# Patient Record
Sex: Female | Born: 1950 | Race: White | Hispanic: No | Smoking: Never smoker
Health system: Southern US, Community
[De-identification: ages and names within clinical notes are randomized; demographics above are authoritative.]

## PROBLEM LIST (undated history)

## (undated) DIAGNOSIS — R600 Localized edema: Secondary | ICD-10-CM

## (undated) DIAGNOSIS — Z87442 Personal history of urinary calculi: Secondary | ICD-10-CM

## (undated) DIAGNOSIS — E559 Vitamin D deficiency, unspecified: Secondary | ICD-10-CM

## (undated) DIAGNOSIS — I10 Essential (primary) hypertension: Secondary | ICD-10-CM

## (undated) DIAGNOSIS — N200 Calculus of kidney: Secondary | ICD-10-CM

## (undated) DIAGNOSIS — Z87448 Personal history of other diseases of urinary system: Secondary | ICD-10-CM

## (undated) DIAGNOSIS — J189 Pneumonia, unspecified organism: Secondary | ICD-10-CM

## (undated) DIAGNOSIS — I714 Abdominal aortic aneurysm, without rupture: Secondary | ICD-10-CM

## (undated) DIAGNOSIS — R0602 Shortness of breath: Secondary | ICD-10-CM

## (undated) DIAGNOSIS — G4733 Obstructive sleep apnea (adult) (pediatric): Secondary | ICD-10-CM

## (undated) DIAGNOSIS — K219 Gastro-esophageal reflux disease without esophagitis: Secondary | ICD-10-CM

## (undated) DIAGNOSIS — E785 Hyperlipidemia, unspecified: Secondary | ICD-10-CM

## (undated) DIAGNOSIS — I2510 Atherosclerotic heart disease of native coronary artery without angina pectoris: Secondary | ICD-10-CM

## (undated) DIAGNOSIS — M2550 Pain in unspecified joint: Secondary | ICD-10-CM

## (undated) DIAGNOSIS — M1990 Unspecified osteoarthritis, unspecified site: Secondary | ICD-10-CM

## (undated) DIAGNOSIS — J4540 Moderate persistent asthma, uncomplicated: Secondary | ICD-10-CM

## (undated) HISTORY — DX: Personal history of other diseases of urinary system: Z87.448

## (undated) HISTORY — PX: COLONOSCOPY: SHX174

## (undated) HISTORY — DX: Moderate persistent asthma, uncomplicated: J45.40

## (undated) HISTORY — DX: Hyperlipidemia, unspecified: E78.5

## (undated) HISTORY — DX: Pain in unspecified joint: M25.50

## (undated) HISTORY — DX: Gastro-esophageal reflux disease without esophagitis: K21.9

## (undated) HISTORY — DX: Shortness of breath: R06.02

## (undated) HISTORY — PX: OTHER SURGICAL HISTORY: SHX169

## (undated) HISTORY — DX: Abdominal aortic aneurysm, without rupture: I71.4

## (undated) HISTORY — DX: Atherosclerotic heart disease of native coronary artery without angina pectoris: I25.10

## (undated) HISTORY — DX: Localized edema: R60.0

## (undated) HISTORY — DX: Obstructive sleep apnea (adult) (pediatric): G47.33

## (undated) HISTORY — PX: RECTAL SURGERY: SHX760

## (undated) HISTORY — PX: CORONARY ANGIOPLASTY: SHX604

## (undated) HISTORY — PX: TOOTH EXTRACTION: SUR596

## (undated) HISTORY — DX: Vitamin D deficiency, unspecified: E55.9

---

## 1982-11-01 HISTORY — PX: TUBAL LIGATION: SHX77

## 1999-09-25 ENCOUNTER — Other Ambulatory Visit: Admission: RE | Admit: 1999-09-25 | Discharge: 1999-09-25 | Payer: Self-pay | Admitting: Family Medicine

## 1999-10-30 ENCOUNTER — Encounter: Admission: RE | Admit: 1999-10-30 | Discharge: 1999-10-30 | Payer: Self-pay | Admitting: Family Medicine

## 1999-10-30 ENCOUNTER — Encounter: Payer: Self-pay | Admitting: Family Medicine

## 2000-01-07 ENCOUNTER — Ambulatory Visit (HOSPITAL_COMMUNITY): Admission: RE | Admit: 2000-01-07 | Discharge: 2000-01-07 | Payer: Self-pay | Admitting: Family Medicine

## 2000-01-30 ENCOUNTER — Encounter: Admission: RE | Admit: 2000-01-30 | Discharge: 2000-01-30 | Payer: Self-pay | Admitting: Family Medicine

## 2000-01-30 ENCOUNTER — Encounter: Payer: Self-pay | Admitting: Family Medicine

## 2000-05-31 ENCOUNTER — Ambulatory Visit (HOSPITAL_COMMUNITY): Admission: RE | Admit: 2000-05-31 | Discharge: 2000-05-31 | Payer: Self-pay | Admitting: *Deleted

## 2000-10-07 ENCOUNTER — Ambulatory Visit (HOSPITAL_COMMUNITY): Admission: RE | Admit: 2000-10-07 | Discharge: 2000-10-07 | Payer: Self-pay | Admitting: Internal Medicine

## 2000-10-07 ENCOUNTER — Encounter: Payer: Self-pay | Admitting: Internal Medicine

## 2000-10-31 ENCOUNTER — Encounter: Payer: Self-pay | Admitting: Family Medicine

## 2000-10-31 ENCOUNTER — Encounter: Admission: RE | Admit: 2000-10-31 | Discharge: 2000-10-31 | Payer: Self-pay | Admitting: Family Medicine

## 2000-12-12 ENCOUNTER — Other Ambulatory Visit: Admission: RE | Admit: 2000-12-12 | Discharge: 2000-12-12 | Payer: Self-pay | Admitting: Family Medicine

## 2002-03-02 ENCOUNTER — Encounter: Payer: Self-pay | Admitting: Internal Medicine

## 2002-03-02 ENCOUNTER — Encounter: Admission: RE | Admit: 2002-03-02 | Discharge: 2002-03-02 | Payer: Self-pay | Admitting: Internal Medicine

## 2002-05-25 ENCOUNTER — Ambulatory Visit (HOSPITAL_BASED_OUTPATIENT_CLINIC_OR_DEPARTMENT_OTHER): Admission: RE | Admit: 2002-05-25 | Discharge: 2002-05-25 | Payer: Self-pay | Admitting: Otolaryngology

## 2003-05-13 ENCOUNTER — Encounter: Payer: Self-pay | Admitting: Internal Medicine

## 2003-05-13 ENCOUNTER — Ambulatory Visit (HOSPITAL_COMMUNITY): Admission: RE | Admit: 2003-05-13 | Discharge: 2003-05-13 | Payer: Self-pay | Admitting: Internal Medicine

## 2003-05-13 ENCOUNTER — Encounter: Admission: RE | Admit: 2003-05-13 | Discharge: 2003-05-13 | Payer: Self-pay | Admitting: Internal Medicine

## 2004-08-07 ENCOUNTER — Encounter: Admission: RE | Admit: 2004-08-07 | Discharge: 2004-08-07 | Payer: Self-pay | Admitting: Internal Medicine

## 2005-10-27 ENCOUNTER — Encounter: Admission: RE | Admit: 2005-10-27 | Discharge: 2005-10-27 | Payer: Self-pay | Admitting: Internal Medicine

## 2006-05-05 ENCOUNTER — Encounter: Admission: RE | Admit: 2006-05-05 | Discharge: 2006-05-05 | Payer: Self-pay | Admitting: Internal Medicine

## 2006-10-28 ENCOUNTER — Encounter: Admission: RE | Admit: 2006-10-28 | Discharge: 2006-10-28 | Payer: Self-pay | Admitting: Internal Medicine

## 2008-05-06 ENCOUNTER — Encounter: Admission: RE | Admit: 2008-05-06 | Discharge: 2008-05-06 | Payer: Self-pay | Admitting: Internal Medicine

## 2009-09-05 ENCOUNTER — Encounter: Admission: RE | Admit: 2009-09-05 | Discharge: 2009-09-05 | Payer: Self-pay | Admitting: Internal Medicine

## 2010-10-27 ENCOUNTER — Encounter
Admission: RE | Admit: 2010-10-27 | Discharge: 2010-10-27 | Payer: Self-pay | Source: Home / Self Care | Attending: Internal Medicine | Admitting: Internal Medicine

## 2013-03-19 ENCOUNTER — Other Ambulatory Visit: Payer: Self-pay

## 2013-03-19 DIAGNOSIS — Z1231 Encounter for screening mammogram for malignant neoplasm of breast: Secondary | ICD-10-CM

## 2013-03-29 ENCOUNTER — Ambulatory Visit
Admission: RE | Admit: 2013-03-29 | Discharge: 2013-03-29 | Disposition: A | Discharge status: Home / Self Care | Payer: BC Managed Care – PPO | Source: Ambulatory Visit

## 2013-03-29 DIAGNOSIS — Z1231 Encounter for screening mammogram for malignant neoplasm of breast: Secondary | ICD-10-CM

## 2014-05-08 ENCOUNTER — Other Ambulatory Visit: Payer: Self-pay

## 2014-05-08 DIAGNOSIS — Z1231 Encounter for screening mammogram for malignant neoplasm of breast: Secondary | ICD-10-CM

## 2014-05-10 ENCOUNTER — Ambulatory Visit
Admission: RE | Admit: 2014-05-10 | Discharge: 2014-05-10 | Disposition: A | Discharge status: Home / Self Care | Payer: BC Managed Care – PPO | Source: Ambulatory Visit

## 2014-05-10 ENCOUNTER — Encounter (INDEPENDENT_AMBULATORY_CARE_PROVIDER_SITE_OTHER): Payer: Self-pay

## 2014-05-10 DIAGNOSIS — Z1231 Encounter for screening mammogram for malignant neoplasm of breast: Secondary | ICD-10-CM

## 2014-06-06 ENCOUNTER — Encounter: Payer: Self-pay | Admitting: Internal Medicine

## 2015-11-24 ENCOUNTER — Other Ambulatory Visit: Payer: Self-pay

## 2015-11-24 DIAGNOSIS — Z1231 Encounter for screening mammogram for malignant neoplasm of breast: Secondary | ICD-10-CM

## 2015-12-12 ENCOUNTER — Ambulatory Visit: Payer: Self-pay

## 2016-01-07 DIAGNOSIS — I10 Essential (primary) hypertension: Secondary | ICD-10-CM | POA: Insufficient documentation

## 2016-01-07 DIAGNOSIS — R5383 Other fatigue: Secondary | ICD-10-CM

## 2016-01-07 DIAGNOSIS — M150 Primary generalized (osteo)arthritis: Secondary | ICD-10-CM

## 2016-01-07 DIAGNOSIS — M159 Polyosteoarthritis, unspecified: Secondary | ICD-10-CM

## 2016-01-07 DIAGNOSIS — R5381 Other malaise: Secondary | ICD-10-CM

## 2016-01-07 DIAGNOSIS — R7309 Other abnormal glucose: Secondary | ICD-10-CM

## 2016-01-07 DIAGNOSIS — K219 Gastro-esophageal reflux disease without esophagitis: Secondary | ICD-10-CM

## 2016-01-07 DIAGNOSIS — Z6841 Body Mass Index (BMI) 40.0 and over, adult: Secondary | ICD-10-CM

## 2016-01-07 HISTORY — DX: Other malaise: R53.81

## 2016-01-07 HISTORY — DX: Polyosteoarthritis, unspecified: M15.9

## 2016-01-07 HISTORY — DX: Other malaise: R53.83

## 2016-01-07 HISTORY — DX: Morbid (severe) obesity due to excess calories: E66.01

## 2016-01-07 HISTORY — DX: Essential (primary) hypertension: I10

## 2016-01-07 HISTORY — DX: Primary generalized (osteo)arthritis: M15.0

## 2016-01-07 HISTORY — DX: Gastro-esophageal reflux disease without esophagitis: K21.9

## 2016-01-07 HISTORY — DX: Other abnormal glucose: R73.09

## 2016-02-16 DIAGNOSIS — R072 Precordial pain: Secondary | ICD-10-CM | POA: Insufficient documentation

## 2016-02-16 DIAGNOSIS — R0609 Other forms of dyspnea: Secondary | ICD-10-CM | POA: Insufficient documentation

## 2016-02-16 DIAGNOSIS — E785 Hyperlipidemia, unspecified: Secondary | ICD-10-CM | POA: Insufficient documentation

## 2016-02-16 HISTORY — DX: Other forms of dyspnea: R06.09

## 2016-02-16 HISTORY — DX: Precordial pain: R07.2

## 2016-02-16 HISTORY — DX: Hyperlipidemia, unspecified: E78.5

## 2016-06-01 DIAGNOSIS — M79604 Pain in right leg: Secondary | ICD-10-CM

## 2016-06-01 DIAGNOSIS — M25551 Pain in right hip: Secondary | ICD-10-CM

## 2016-06-01 DIAGNOSIS — M545 Low back pain, unspecified: Secondary | ICD-10-CM | POA: Insufficient documentation

## 2016-06-01 HISTORY — DX: Low back pain, unspecified: M54.50

## 2016-06-01 HISTORY — DX: Pain in right leg: M79.604

## 2016-06-01 HISTORY — DX: Pain in left hip: M25.551

## 2016-08-10 DIAGNOSIS — J4540 Moderate persistent asthma, uncomplicated: Secondary | ICD-10-CM

## 2016-08-10 DIAGNOSIS — J984 Other disorders of lung: Secondary | ICD-10-CM

## 2016-08-10 HISTORY — DX: Moderate persistent asthma, uncomplicated: J45.40

## 2016-08-10 HISTORY — DX: Other disorders of lung: J98.4

## 2016-09-30 DIAGNOSIS — G2581 Restless legs syndrome: Secondary | ICD-10-CM

## 2016-09-30 DIAGNOSIS — G4733 Obstructive sleep apnea (adult) (pediatric): Secondary | ICD-10-CM

## 2016-09-30 HISTORY — DX: Obstructive sleep apnea (adult) (pediatric): G47.33

## 2016-09-30 HISTORY — DX: Restless legs syndrome: G25.81

## 2017-01-27 DIAGNOSIS — E785 Hyperlipidemia, unspecified: Secondary | ICD-10-CM | POA: Insufficient documentation

## 2017-01-27 HISTORY — DX: Hyperlipidemia, unspecified: E78.5

## 2017-02-09 DIAGNOSIS — I712 Thoracic aortic aneurysm, without rupture, unspecified: Secondary | ICD-10-CM

## 2017-02-09 DIAGNOSIS — I2510 Atherosclerotic heart disease of native coronary artery without angina pectoris: Secondary | ICD-10-CM

## 2017-02-09 HISTORY — DX: Thoracic aortic aneurysm, without rupture, unspecified: I71.20

## 2017-02-09 HISTORY — DX: Atherosclerotic heart disease of native coronary artery without angina pectoris: I25.10

## 2017-04-21 DIAGNOSIS — Z8639 Personal history of other endocrine, nutritional and metabolic disease: Secondary | ICD-10-CM

## 2017-04-21 HISTORY — DX: Personal history of other endocrine, nutritional and metabolic disease: Z86.39

## 2017-05-09 ENCOUNTER — Other Ambulatory Visit: Payer: Self-pay | Admitting: Internal Medicine

## 2017-05-09 DIAGNOSIS — Z1231 Encounter for screening mammogram for malignant neoplasm of breast: Secondary | ICD-10-CM

## 2017-05-31 ENCOUNTER — Ambulatory Visit
Admission: RE | Admit: 2017-05-31 | Discharge: 2017-05-31 | Disposition: A | Discharge status: Home / Self Care | Payer: BLUE CROSS/BLUE SHIELD | Source: Ambulatory Visit | Attending: Internal Medicine | Admitting: Internal Medicine

## 2017-05-31 DIAGNOSIS — Z1231 Encounter for screening mammogram for malignant neoplasm of breast: Secondary | ICD-10-CM

## 2017-05-31 IMAGING — MG 2D DIGITAL SCREENING BILATERAL MAMMOGRAM WITH CAD AND ADJUNCT TO
3 series · 3 of 3 positions shown · non-contrast
Comparison: Previous exam(s).

CLINICAL DATA: Screening.

EXAM:
2D DIGITAL SCREENING BILATERAL MAMMOGRAM WITH CAD AND ADJUNCT TOMO

[L MLO]
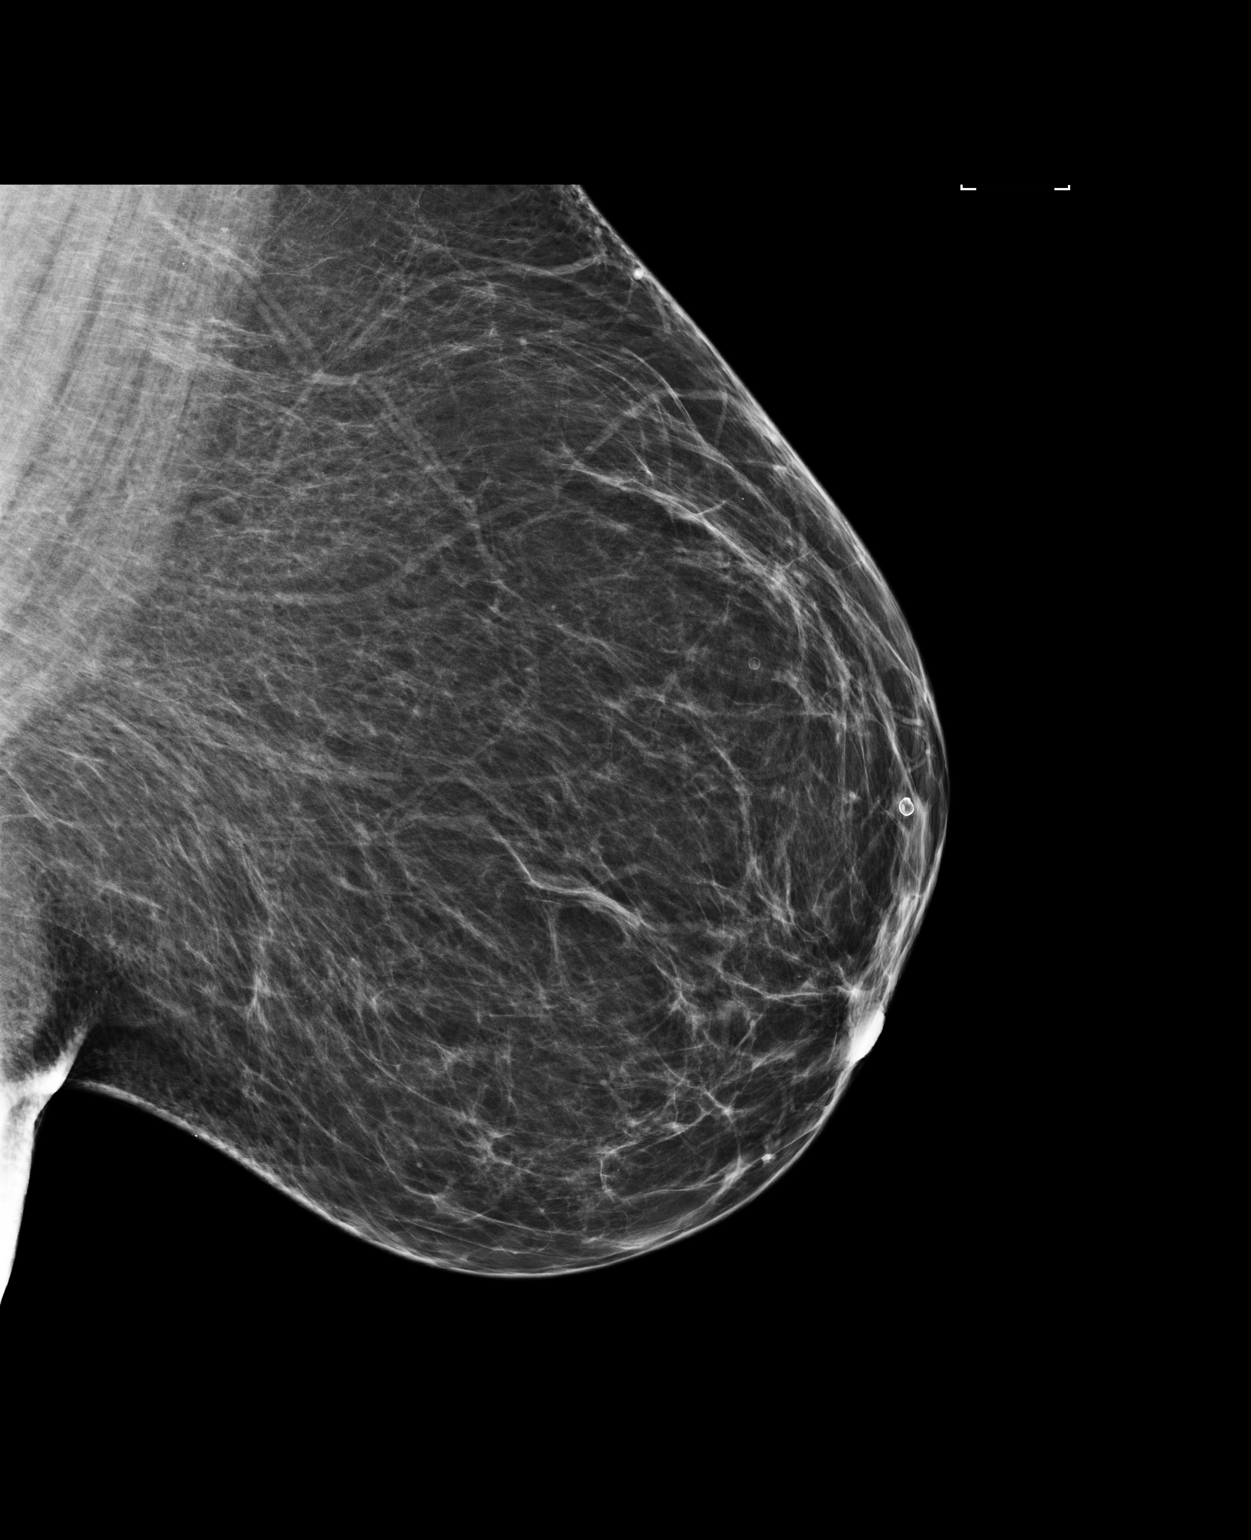

[R CC]
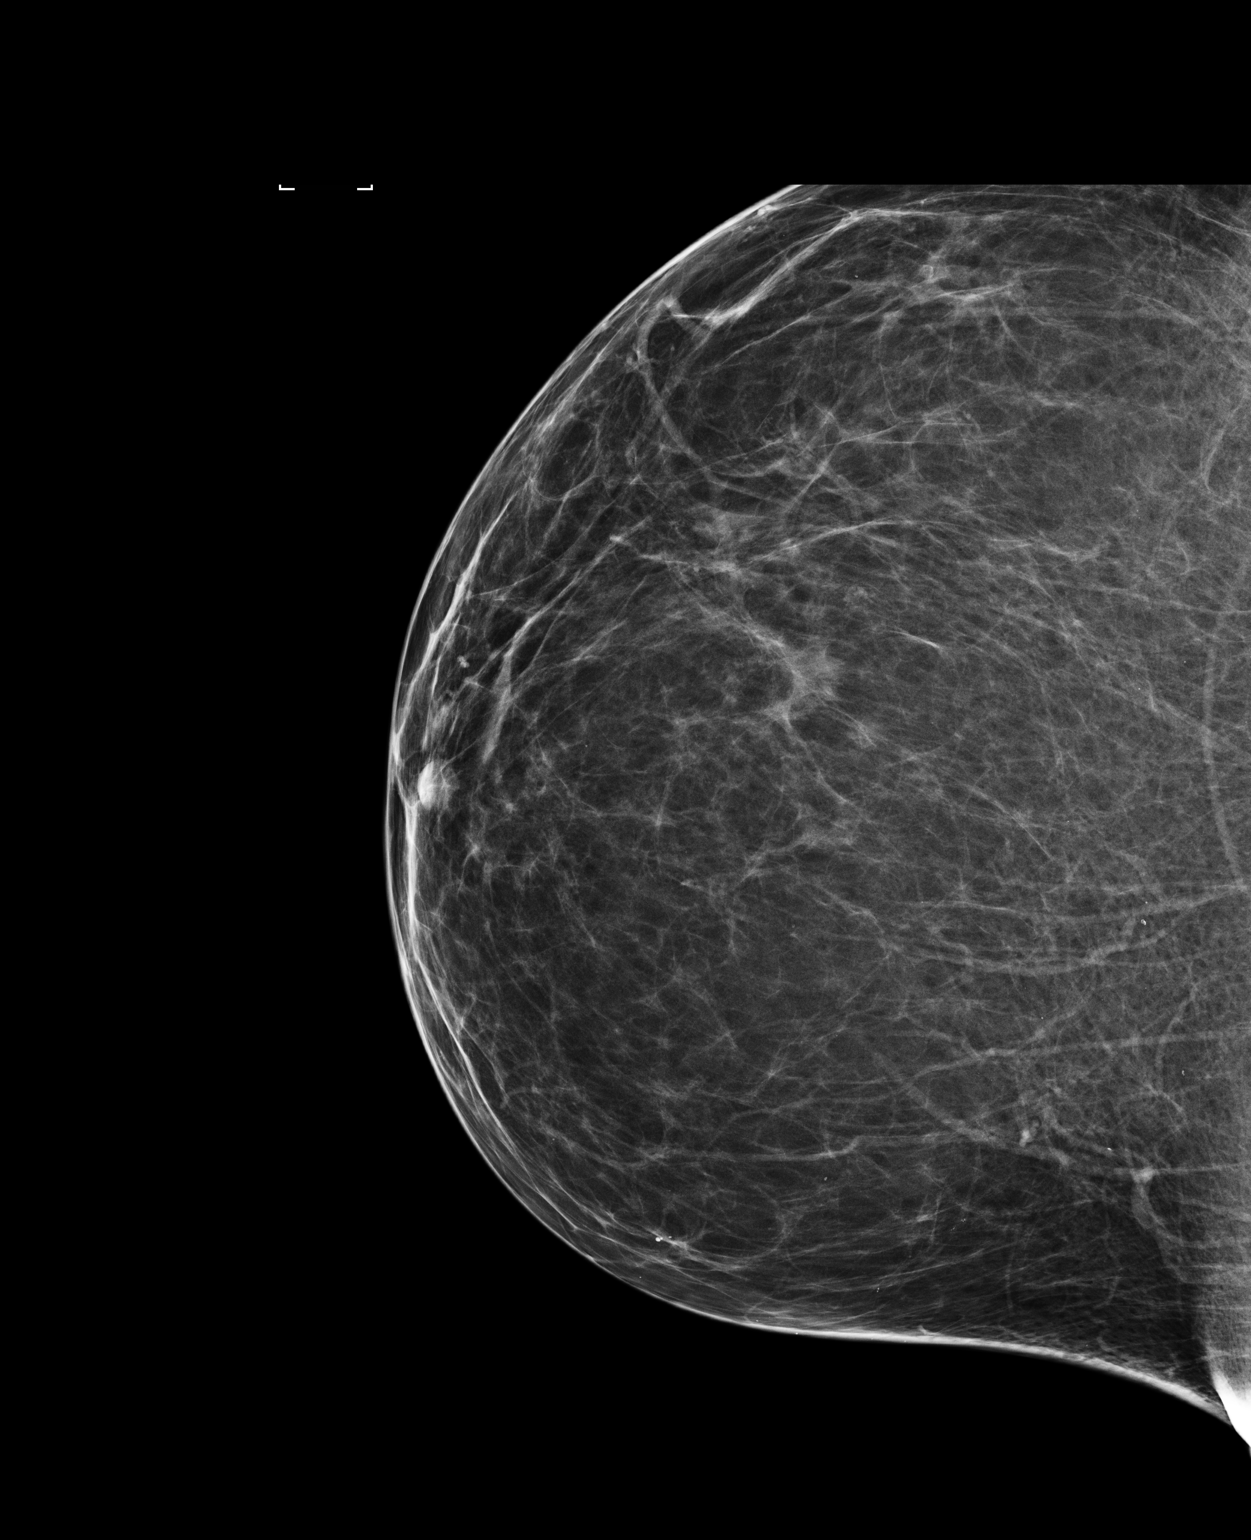

[R MLO]
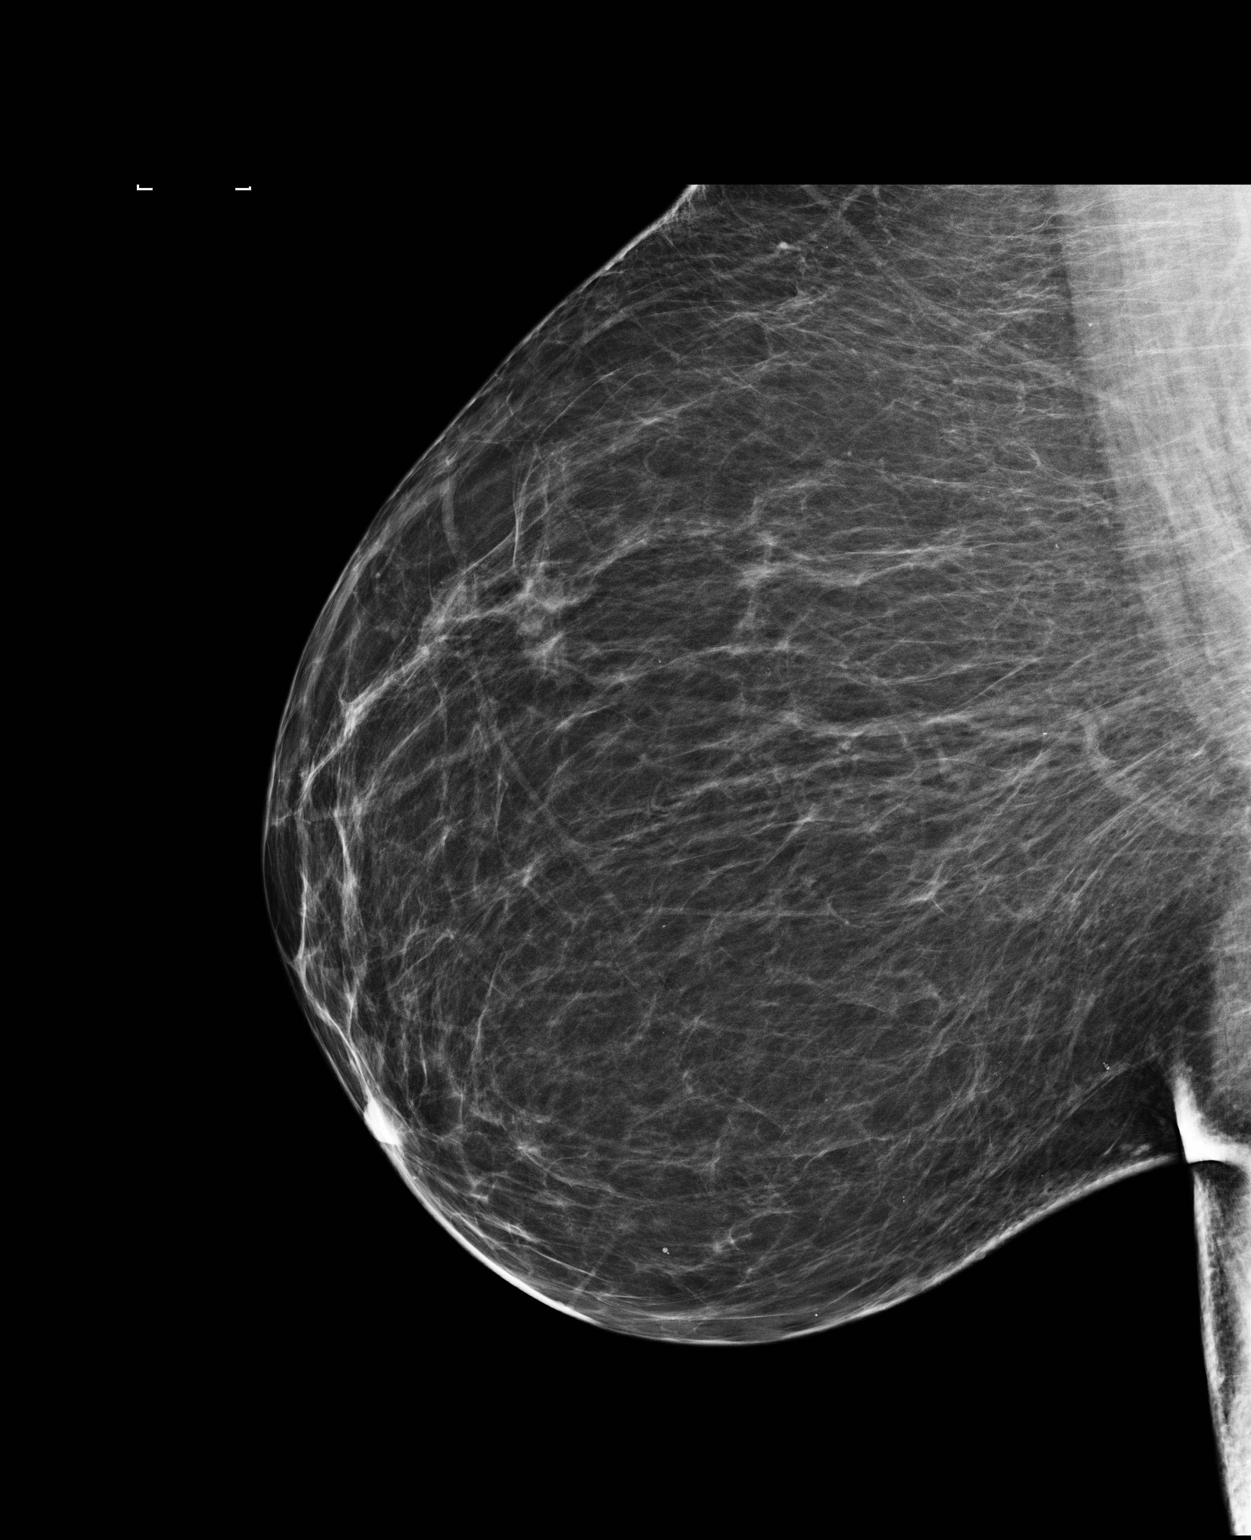

[3 of 3 positions shown; findings below may reference images not displayed]

ACR Breast Density Category b: There are scattered areas of
fibroglandular density.
FINDINGS: There are no findings suspicious for malignancy. Images were
processed with CAD.
IMPRESSION: No mammographic evidence of malignancy. A result letter of this
screening mammogram will be mailed directly to the patient.

RECOMMENDATION:
Screening mammogram in one year. (Code:[33])

BI-RADS CATEGORY  1: Negative.

## 2017-09-01 DIAGNOSIS — I714 Abdominal aortic aneurysm, without rupture, unspecified: Secondary | ICD-10-CM

## 2017-09-01 HISTORY — DX: Abdominal aortic aneurysm, without rupture: I71.4

## 2017-09-01 HISTORY — DX: Abdominal aortic aneurysm, without rupture, unspecified: I71.40

## 2018-03-10 ENCOUNTER — Encounter: Payer: Self-pay | Admitting: Cardiology

## 2018-09-06 ENCOUNTER — Other Ambulatory Visit: Payer: Self-pay | Admitting: Internal Medicine

## 2018-09-06 DIAGNOSIS — Z1231 Encounter for screening mammogram for malignant neoplasm of breast: Secondary | ICD-10-CM

## 2018-10-04 DIAGNOSIS — L659 Nonscarring hair loss, unspecified: Secondary | ICD-10-CM

## 2018-10-04 HISTORY — DX: Nonscarring hair loss, unspecified: L65.9

## 2018-10-23 ENCOUNTER — Ambulatory Visit: Payer: BLUE CROSS/BLUE SHIELD | Admitting: Cardiology

## 2018-10-27 ENCOUNTER — Ambulatory Visit: Payer: BLUE CROSS/BLUE SHIELD

## 2018-11-09 ENCOUNTER — Ambulatory Visit (INDEPENDENT_AMBULATORY_CARE_PROVIDER_SITE_OTHER): Payer: Medicare Other | Admitting: Cardiology

## 2018-11-09 ENCOUNTER — Encounter: Payer: Self-pay | Admitting: Cardiology

## 2018-11-09 VITALS — BP 122/70 | HR 68 | Ht <= 58 in | Wt 222.0 lb

## 2018-11-09 DIAGNOSIS — I2510 Atherosclerotic heart disease of native coronary artery without angina pectoris: Secondary | ICD-10-CM | POA: Diagnosis not present

## 2018-11-09 DIAGNOSIS — E785 Hyperlipidemia, unspecified: Secondary | ICD-10-CM

## 2018-11-09 DIAGNOSIS — I712 Thoracic aortic aneurysm, without rupture, unspecified: Secondary | ICD-10-CM

## 2018-11-09 DIAGNOSIS — I714 Abdominal aortic aneurysm, without rupture, unspecified: Secondary | ICD-10-CM

## 2018-11-09 DIAGNOSIS — I10 Essential (primary) hypertension: Secondary | ICD-10-CM | POA: Diagnosis not present

## 2018-11-09 DIAGNOSIS — G4733 Obstructive sleep apnea (adult) (pediatric): Secondary | ICD-10-CM

## 2018-11-09 LAB — BASIC METABOLIC PANEL
BUN/Creatinine Ratio: 27 (ref 12–28)
BUN: 27 mg/dL (ref 8–27)
CO2: 23 mmol/L (ref 20–29)
Calcium: 9.7 mg/dL (ref 8.7–10.3)
Chloride: 105 mmol/L (ref 96–106)
Creatinine, Ser: 0.99 mg/dL (ref 0.57–1.00)
GFR calc Af Amer: 67 mL/min/{1.73_m2} (ref >59)
GFR calc non Af Amer: 58 mL/min/{1.73_m2} — ABNORMAL LOW (ref >59)
Glucose: 99 mg/dL (ref 65–99)
Potassium: 4.4 mmol/L (ref 3.5–5.2)
Sodium: 143 mmol/L (ref 134–144)

## 2018-11-09 LAB — LIPID PANEL
Chol/HDL Ratio: 4.1 ratio (ref 0.0–4.4)
Cholesterol, Total: 184 mg/dL (ref 100–199)
HDL: 45 mg/dL (ref >39)
LDL Calculated: 111 mg/dL — ABNORMAL HIGH (ref 0–99)
Triglycerides: 138 mg/dL (ref 0–149)
VLDL Cholesterol Cal: 28 mg/dL (ref 5–40)

## 2018-11-09 NOTE — Progress Notes (Signed)
Cardiology Office Note:    Date:  11/09/2018   ID:  Alyssa CasaLeatrice C Dipasquale, DOB May 09, 1951, MRN [id number]  PCP:  Gordan PaymentGrisso, Greg A., MD  Cardiologist:  Gypsy Balsamobert Antoninette Lerner, MD    Referring MD: Gordan PaymentGrisso, Greg A., MD   Chief Complaint  Patient presents with  . Follow-up  Doing well  History of Present Illness:    Alyssa Rosales is a 68 y.o. female with ascending aortic aneurysm.  Still works and enjoyed denies have any chest pain tightness squeezing pressure burning chest.  Came back to our office to be reestablished as a patient.  Past Medical History:  Diagnosis Date  . Abdominal aortic aneurysm (AAA) without rupture (HCC) 09/01/2017  . Coronary artery disease involving native coronary artery of native heart without angina pectoris 02/09/2017  . GERD without esophagitis 01/07/2016   Last Assessment & Plan:  Continue with the PPI and have refilled this for her  . Hyperlipidemia 01/27/2017   Overview:  Added automatically from request for surgery [id number]  . Moderate persistent asthma without complication 08/10/2016  . OSA (obstructive sleep apnea) 09/30/2016   Follows with pulmonary is seen 12/19 for FU bipap 21/15      Current Medications: Current Meds  Medication Sig  . aspirin EC 81 MG tablet Take 2 tablets by mouth daily.  . budesonide-formoterol (SYMBICORT) 160-4.5 MCG/ACT inhaler Inhale 2 puffs into the lungs 2 (two) times daily.  . fluticasone (FLONASE) 50 MCG/ACT nasal spray Place 1 spray into the nose daily.  Marland Kitchen. HYDROcodone-acetaminophen (NORCO/VICODIN) 5-325 MG tablet Take 1-2 tablets by mouth every 6 (six) hours as needed.  Marland Kitchen. losartan (COZAAR) 50 MG tablet Take 2 tablets by mouth daily.  Marland Kitchen. omeprazole (PRILOSEC) 20 MG capsule Take 1 capsule by mouth 2 (two) times daily.  Marland Kitchen. torsemide (DEMADEX) 10 MG tablet Take 1 tablet by mouth 2 (two) times daily.  . Vitamin D, Ergocalciferol, (DRISDOL) 1.25 MG (50000 UT) CAPS capsule Take 1 capsule by mouth once a week.     Allergies:    Amoxicillin-pot clavulanate; Penicillins; Propoxyphene; and Sulfamethoxazole   Social History   Socioeconomic History  . Marital status: Divorced    Spouse name: Not on file  . Number of children: Not on file  . Years of education: Not on file  . Highest education level: Not on file  Occupational History  . Not on file  Social Needs  . Financial resource strain: Not on file  . Food insecurity:    Worry: Not on file    Inability: Not on file  . Transportation needs:    Medical: Not on file    Non-medical: Not on file  Tobacco Use  . Smoking status: Never Smoker  . Smokeless tobacco: Never Used  Substance and Sexual Activity  . Alcohol use: Never    Frequency: Never  . Drug use: Never  . Sexual activity: Not on file  Lifestyle  . Physical activity:    Days per week: Not on file    Minutes per session: Not on file  . Stress: Not on file  Relationships  . Social connections:    Talks on phone: Not on file    Gets together: Not on file    Attends religious service: Not on file    Active member of club or organization: Not on file    Attends meetings of clubs or organizations: Not on file    Relationship status: Not on file  Other Topics Concern  . Not on file  Social History Narrative  . Not on file     Family History: The patient's family history includes Alzheimer's disease in her mother. ROS:   Please see the history of present illness.    All 14 point review of systems negative except as described per history of present illness  EKGs/Labs/Other Studies Reviewed:      Recent Labs: No results found for requested labs within last 8760 hours.  Recent Lipid Panel No results found for: CHOL, TRIG, HDL, CHOLHDL, VLDL, LDLCALC, LDLDIRECT  Physical Exam:    VS:  BP 122/70   Pulse 68   Ht 4' 8.5" (1.435 m)   Wt 222 lb (100.7 kg)   SpO2 97%   BMI 48.89 kg/m     Wt Readings from Last 3 Encounters:  11/09/18 222 lb (100.7 kg)     GEN:  Well nourished,  well developed in no acute distress HEENT: Normal NECK: No JVD; No carotid bruits LYMPHATICS: No lymphadenopathy CARDIAC: RRR, no murmurs, no rubs, no gallops RESPIRATORY:  Clear to auscultation without rales, wheezing or rhonchi  ABDOMEN: Soft, non-tender, non-distended MUSCULOSKELETAL:  No edema; No deformity  SKIN: Warm and dry LOWER EXTREMITIES: no swelling NEUROLOGIC:  Alert and oriented x 3 PSYCHIATRIC:  Normal affect   ASSESSMENT:    1. Abdominal aortic aneurysm (AAA) without rupture (HCC)   2. Coronary artery disease involving native coronary artery of native heart without angina pectoris   3. Essential hypertension   4. Dyslipidemia   5. Thoracic aortic aneurysm without rupture (HCC)   6. OSA (obstructive sleep apnea)    PLAN:    In order of problems listed above:  1. Ascending thoracic aneurysm we will schedule her to have CT of her chest.  Last measurements done in May of last year showed 4.6 cm and ascending aortic aneurysm 2. Essential hypertension blood pressure well controlled continue present management. 3. Dyslipidemia I will schedule her to have fasting lipid profile done 4. Obstructive sleep apnea she use CPAP mask he is very happy with it 5. Abdominal aortic aneurysm I am not sure where this diagnosis came from we will investigate in the chart if there is any evidence of it.   Medication Adjustments/Labs and Tests Ordered: Current medicines are reviewed at length with the patient today.  Concerns regarding medicines are outlined above.  Orders Placed This Encounter  Procedures  . CT ANGIO CHEST AORTA W &/OR WO CONTRAST  . Basic metabolic panel  . Lipid panel  . ECHOCARDIOGRAM COMPLETE   Medication changes: No orders of the defined types were placed in this encounter.   Signed, Georgeanna Lea, MD, [facility] 11/09/2018 10:35 AM    [facility]

## 2018-11-09 NOTE — Patient Instructions (Signed)
Medication Instructions:  Your physician recommends that you continue on your current medications as directed. Please refer to the Current Medication list given to you today.  If you need a refill on your cardiac medications before your next appointment, please call your pharmacy.   Lab work: Your physician recommends that you return for lab work today: Bmp, lipids  If you have labs (blood work) drawn today and your tests are completely normal, you will receive your results only by: Marland Kitchen MyChart Message (if you have MyChart) OR . A paper copy in the mail If you have any lab test that is abnormal or we need to change your treatment, we will call you to review the results.  Testing/Procedures: Your physician has requested that you have an echocardiogram. Echocardiography is a painless test that uses sound waves to create images of your heart. It provides your doctor with information about the size and shape of your heart and how well your heart's chambers and valves are working. This procedure takes approximately one hour. There are no restrictions for this procedure.  Non-Cardiac CT Angiography (CTA), is a special type of CT scan that uses a computer to produce multi-dimensional views of major blood vessels throughout the body. In CT angiography, a contrast material is injected through an IV to help visualize the blood vessels      Follow-Up: At [facility], you and your health needs are our priority.  As part of our continuing mission to provide you with exceptional heart care, we have created designated Provider Care Teams.  These Care Teams include your primary Cardiologist (physician) and Advanced Practice Providers (APPs -  Physician Assistants and Nurse Practitioners) who all work together to provide you with the care you need, when you need it. You will need a follow up appointment in 5 months.  Please call our office 2 months in advance to schedule this appointment.  You may see No  primary care provider on file. or another member of our [employer] Provider Team in [place]: Norman Herrlich, MD . Belva Crome, MD  Any Other Special Instructions Will Be Listed Below (If Applicable).   Echocardiogram An echocardiogram is a procedure that uses painless sound waves (ultrasound) to produce an image of the heart. Images from an echocardiogram can provide important information about:  Signs of coronary artery disease (CAD).  Aneurysm detection. An aneurysm is a weak or damaged part of an artery wall that bulges out from the normal force of blood pumping through the body.  Heart size and shape. Changes in the size or shape of the heart can be associated with certain conditions, including heart failure, aneurysm, and CAD.  Heart muscle function.  Heart valve function.  Signs of a past heart attack.  Fluid buildup around the heart.  Thickening of the heart muscle.  A tumor or infectious growth around the heart valves. Tell a health care provider about:  Any allergies you have.  All medicines you are taking, including vitamins, herbs, eye drops, creams, and over-the-counter medicines.  Any blood disorders you have.  Any surgeries you have had.  Any medical conditions you have.  Whether you are pregnant or may be pregnant. What are the risks? Generally, this is a safe procedure. However, problems may occur, including:  Allergic reaction to dye (contrast) that may be used during the procedure. What happens before the procedure? No specific preparation is needed. You may eat and drink normally. What happens during the procedure?   An IV  tube may be inserted into one of your veins.  You may receive contrast through this tube. A contrast is an injection that improves the quality of the pictures from your heart.  A gel will be applied to your chest.  A wand-like tool (transducer) will be moved over your chest. The gel will help to transmit the sound  waves from the transducer.  The sound waves will harmlessly bounce off of your heart to allow the heart images to be captured in real-time motion. The images will be recorded on a computer. The procedure may vary among health care providers and hospitals. What happens after the procedure?  You may return to your normal, everyday life, including diet, activities, and medicines, unless your health care provider tells you not to do that. Summary  An echocardiogram is a procedure that uses painless sound waves (ultrasound) to produce an image of the heart.  Images from an echocardiogram can provide important information about the size and shape of your heart, heart muscle function, heart valve function, and fluid buildup around your heart.  You do not need to do anything to prepare before this procedure. You may eat and drink normally.  After the echocardiogram is completed, you may return to your normal, everyday life, unless your health care provider tells you not to do that. This information is not intended to replace advice given to you by your health care provider. Make sure you discuss any questions you have with your health care provider. Document Released: 10/15/2000 Document Revised: 11/20/2016 Document Reviewed: 11/20/2016 Elsevier Interactive Patient Education  2019 [employer].   CT Angiogram  A CT angiogram is a procedure to look at the blood vessels in various areas of the body. For this procedure, a large X-ray machine, called a CT scanner, takes detailed pictures of blood vessels that have been injected with a dye (contrast material). A CT angiogram allows your health care provider to see how well blood is flowing to the area of your body that is being checked. Your health care provider will be able to see if there are any problems, such as a blockage. Tell a health care provider about:  Any allergies you have.  All medicines you are taking, including vitamins, herbs, eye  drops, creams, and over-the-counter medicines.  Any problems you or family members have had with anesthetic medicines.  Any blood disorders you have.  Any surgeries you have had.  Any medical conditions you have.  Whether you are pregnant or may be pregnant.  Whether you are breastfeeding.  Any anxiety disorders, chronic pain, or other conditions you have that may increase your stress or prevent you from lying still. What are the risks? Generally, this is a safe procedure. However, problems may occur, including:  Infection.  Bleeding.  Allergic reactions to medicines or dyes.  Damage to other structures or organs.  Kidney damage from the dye or contrast that is used.  Increased risk of cancer from radiation exposure. This risk is low. Talk with your health care provider about: ? The risks and benefits of testing. ? How you can receive the lowest dose of radiation. What happens before the procedure?  Wear comfortable clothing and remove any jewelry.  Follow instructions from your health care provider about eating and drinking. For most people, instructions may include these actions: ? For 12 hours before the test, avoid caffeine. This includes tea, coffee, soda, and energy drinks or pills. ? For 3-4 hours before the test, stop eating  or drinking anything but water. ? Stay well hydrated by continuing to drink water before the exam. This will help to clear the contrast dye from your body after the test.  Ask your health care provider about changing or stopping your regular medicines. This is especially important if you are taking diabetes medicines or blood thinners. What happens during the procedure?  An IV tube will be inserted into one of your veins.  You will be asked to lie on an exam table. This table will slide in and out of the CT machine during the procedure.  Contrast dye will be injected into the IV tube. You might feel warm, or you may get a metallic taste in  your mouth.  The table that you are lying on will move into the CT machine tunnel for the scan.  The person running the machine will give you instructions while the scans are being done. You may be asked to: ? Keep your arms above your head. ? Hold your breath. ? Stay very still, even if the table is moving.  When the scanning is complete, you will be moved out of the machine.  The IV tube will be removed. The procedure may vary among health care providers and hospitals. What happens after the procedure?  You might feel warm, or you may get a metallic taste in your mouth.  You may be asked to drink water or other fluids to wash (flush) the contrast material out of your body.  It is up to you to get the results of your procedure. Ask your health care provider, or the department that is doing the procedure, when your results will be ready. Summary  A CT angiogram is a procedure to look at the blood vessels in various areas of the body.  You will need to stay very still during the exam.  You may be asked to drink water or other fluids to wash (flush) the contrast material out of your body after your scan. This information is not intended to replace advice given to you by your health care provider. Make sure you discuss any questions you have with your health care provider. Document Released: 06/17/2016 Document Revised: 06/17/2016 Document Reviewed: 06/17/2016 Elsevier Interactive Patient Education  [employer]2019 Elsevier Inc[employer]

## 2018-11-10 ENCOUNTER — Telehealth: Payer: Self-pay | Admitting: Emergency Medicine

## 2018-11-10 MED ORDER — ATORVASTATIN CALCIUM 10 MG PO TABS: 10.0000 mg | ORAL_TABLET | Freq: Every day | ORAL | 1 refills | Status: DC

## 2018-11-10 NOTE — Telephone Encounter (Signed)
Patient informed of lab results and informed to start lipitor 10 mg daily, patient verbally understands.

## 2018-11-13 ENCOUNTER — Telehealth: Payer: Self-pay | Admitting: Emergency Medicine

## 2018-11-13 NOTE — Telephone Encounter (Signed)
Left message and left appointment date and time for ct in [place]. Advised her to call back with any questions.

## 2018-11-14 NOTE — Telephone Encounter (Signed)
Confirmed [facility] with [facility] [place] Patient verbally understands

## 2018-11-15 ENCOUNTER — Encounter (HOSPITAL_BASED_OUTPATIENT_CLINIC_OR_DEPARTMENT_OTHER): Payer: Self-pay

## 2018-11-15 ENCOUNTER — Ambulatory Visit (HOSPITAL_BASED_OUTPATIENT_CLINIC_OR_DEPARTMENT_OTHER)
Admission: RE | Admit: 2018-11-15 | Discharge: 2018-11-15 | Disposition: A | Discharge status: Home / Self Care | Payer: Medicare Other | Source: Ambulatory Visit | Attending: Cardiology | Admitting: Cardiology

## 2018-11-15 DIAGNOSIS — I2510 Atherosclerotic heart disease of native coronary artery without angina pectoris: Secondary | ICD-10-CM | POA: Insufficient documentation

## 2018-11-15 DIAGNOSIS — I714 Abdominal aortic aneurysm, without rupture: Secondary | ICD-10-CM | POA: Diagnosis not present

## 2018-11-15 HISTORY — DX: Essential (primary) hypertension: I10

## 2018-11-15 IMAGING — CT CT ANGIO CHEST
2 of 6 series · 18 of 46 positions shown · IV contrast (APPLIED)
Comparison: [DATE]

CLINICAL DATA: Follow-up of aneurysmal disease of the ascending
thoracic aorta previously estimated to be approximately 4.4 cm in
maximal diameter.

EXAM:
CT ANGIOGRAPHY CHEST WITH CONTRAST
TECHNIQUE: Multidetector CT imaging of the chest was performed using the
standard protocol during bolus administration of intravenous
contrast. Multiplanar CT image reconstructions and MIPs were
obtained to evaluate the vascular anatomy.
CONTRAST:  100mL [HB] IOPAMIDOL ([HB]) INJECTION 76%

[Series 5: axial arterial · axial · arterial · 0.67mm/px · z∈[+1019,+1262]mm · 15 of 91 slices shown]
[im 5/91  lung]
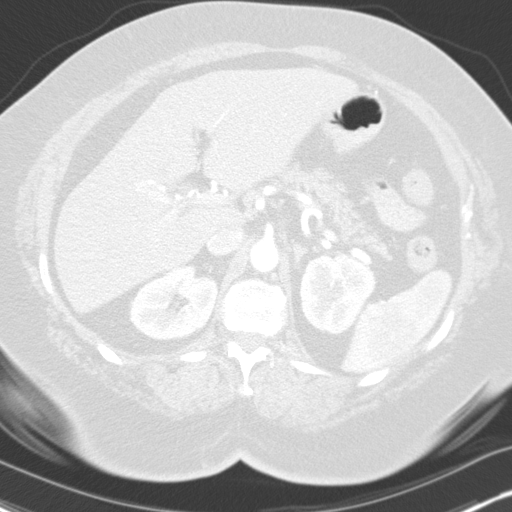
[im 10/91  soft-tissue]
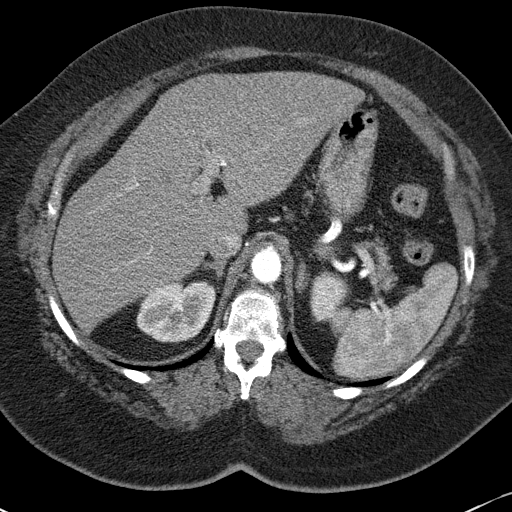
[im 19/91  lung]
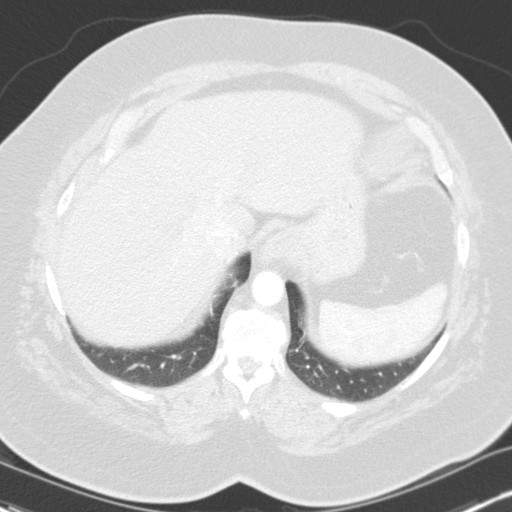
[im 24/91  soft-tissue]
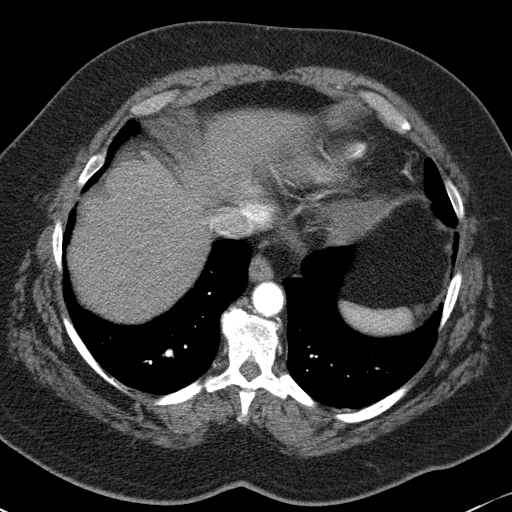
[im 29/91  lung]
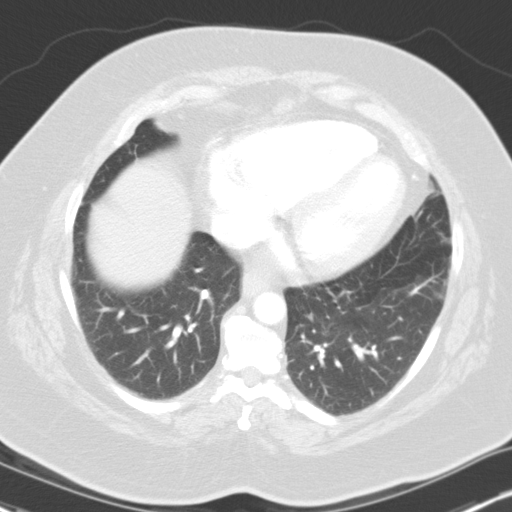
[im 34/91  soft-tissue]
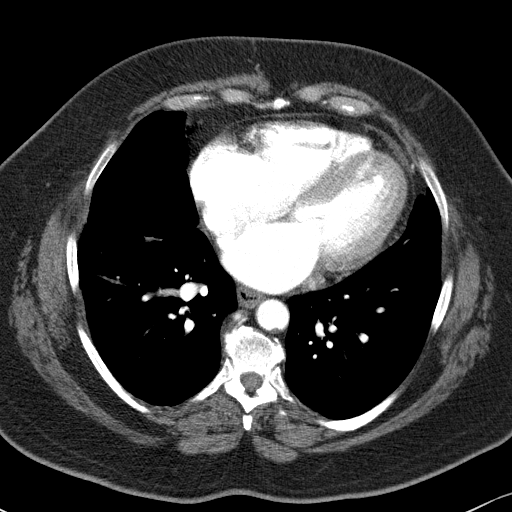
[im 38/91  lung]
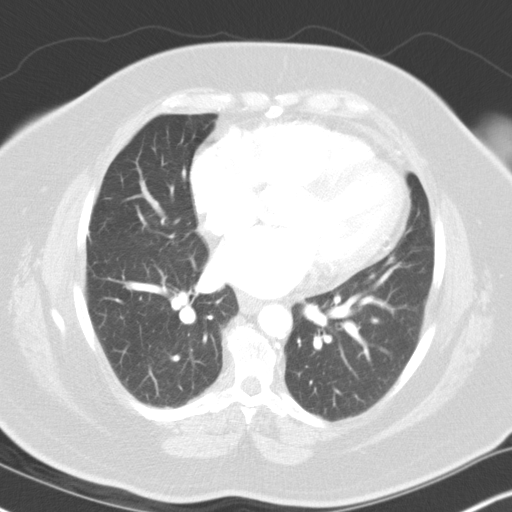
[im 48/91  soft-tissue]
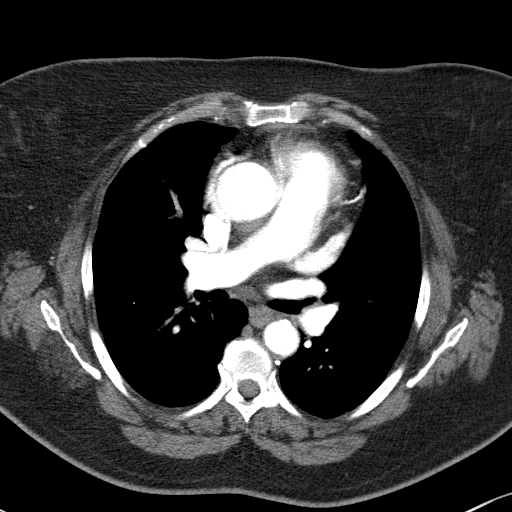
[im 53/91  lung]
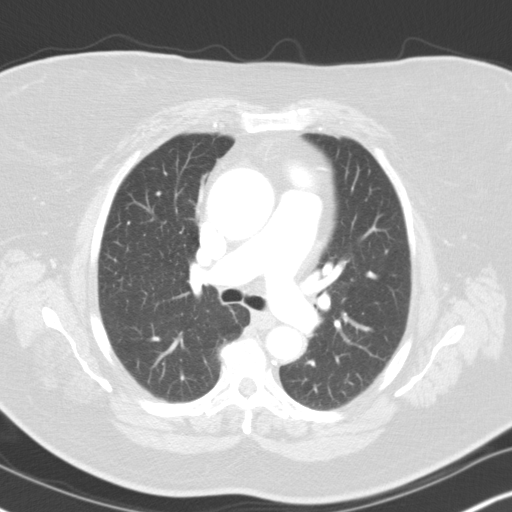
[im 57/91  soft-tissue]
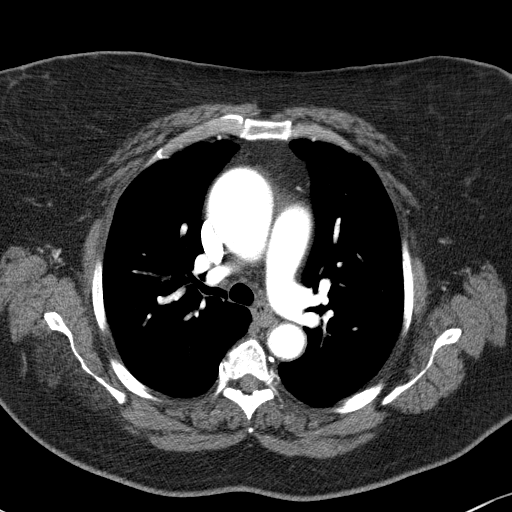
[im 62/91  lung]
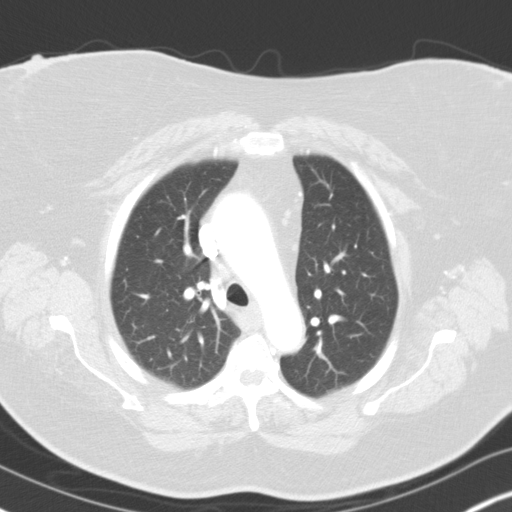
[im 67/91  soft-tissue]
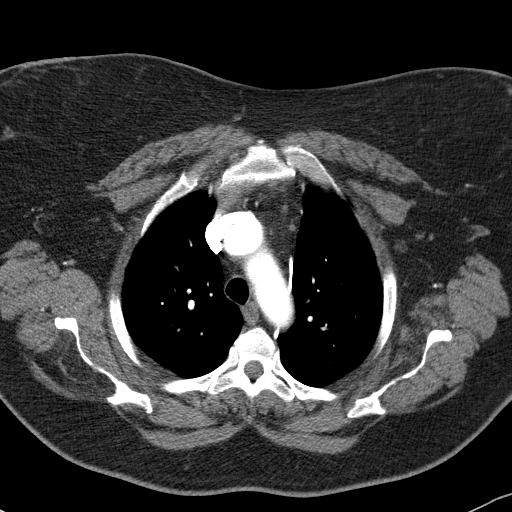
[im 76/91  lung]
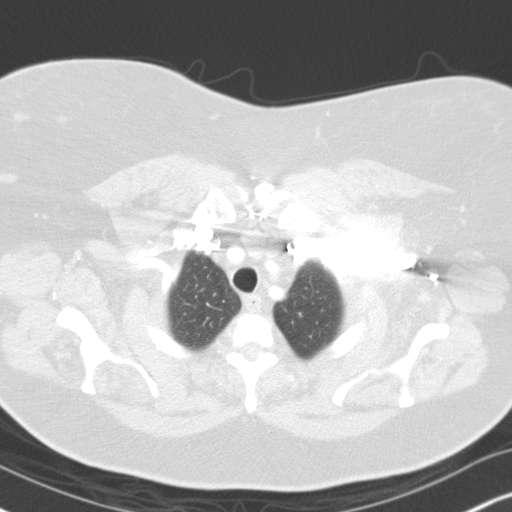
[im 81/91  soft-tissue]
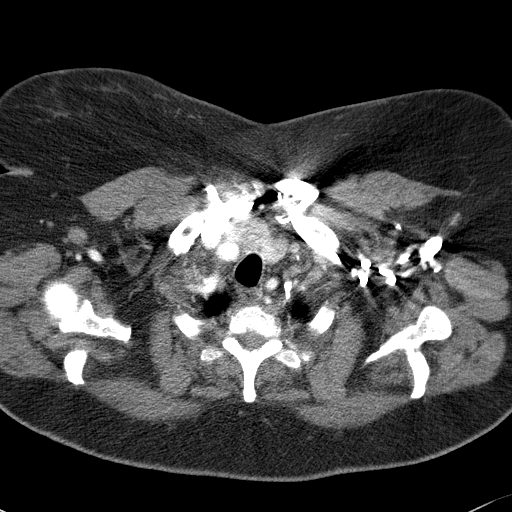
[im 86/91  lung]
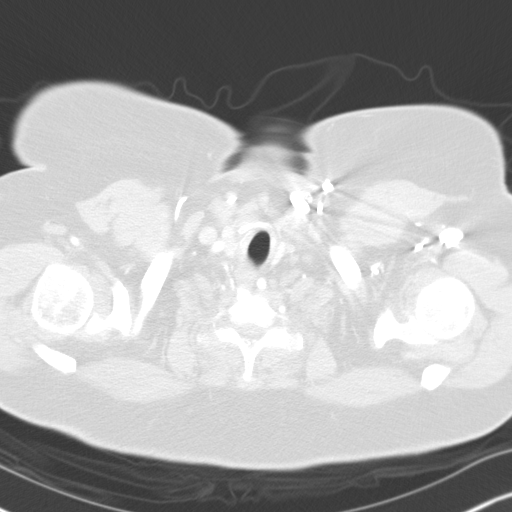

[Series 7: coronal · coronal · 0.59mm/px · 3 of 83 slices shown]
[im 21/83  soft-tissue]
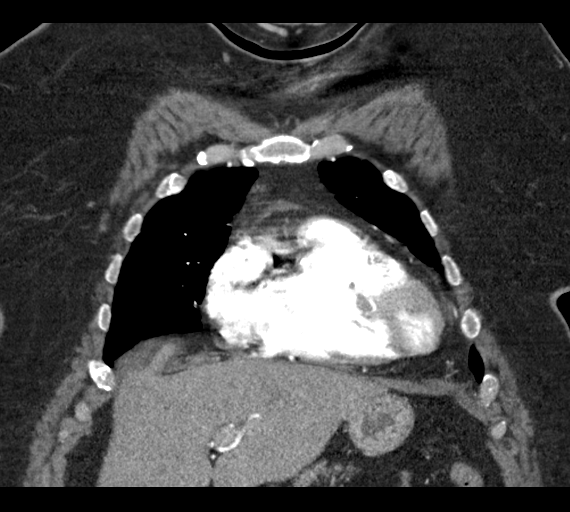
[im 42/83  soft-tissue]
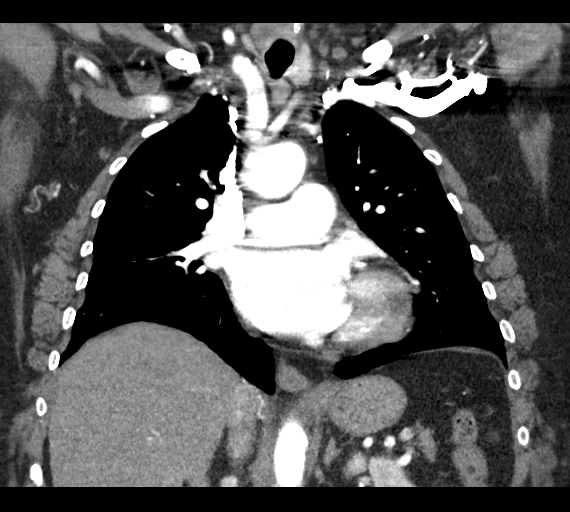
[im 62/83  soft-tissue]
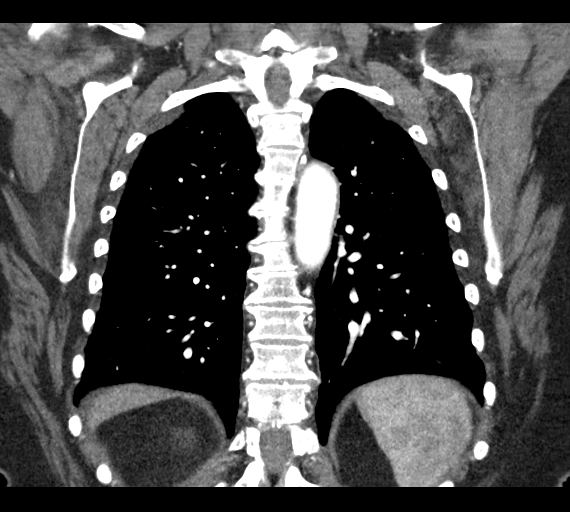

[18 of 46 positions shown; findings below may reference images not displayed]

FINDINGS: Cardiovascular: The aortic root is normal in caliber measuring
approximately 3.1 cm at the level of the sinuses of Valsalva. Stable
aneurysmal dilatation of the ascending thoracic aorta reaches
maximal caliber of approximately 4.3 cm. The proximal arch measures
3.7 cm and the distal arch 2.3 cm. The descending thoracic aorta
measures 2.2 cm. No aortic dissection identified. Minimal
atherosclerotic plaque present in the descending aorta. Proximal
great vessels show normal branching anatomy and normal patency.

The heart is mildly enlarged. No pericardial fluid identified.
Scattered calcified plaque present in the distribution of the LAD
and left circumflex coronary arteries. Central pulmonary arteries
are normal in caliber.

Mediastinum/Nodes: No enlarged mediastinal, hilar, or axillary lymph
nodes. Trachea and esophagus demonstrate no significant findings.

Lungs/Pleura: There is no evidence of pulmonary edema,
consolidation, pneumothorax, nodule or pleural fluid.

Upper Abdomen: The liver demonstrates steatosis. Relative
enlargement of the left lobe and increase in periportal fat anterior
to the main and right intrahepatic portal veins may be consistent
with early cirrhosis.

Musculoskeletal: No chest wall abnormality. No acute or significant
osseous findings.

Review of the MIP images confirms the above findings.
IMPRESSION: 1. Stable aneurysmal dilatation of the ascending thoracic aorta
measuring approximately 4.3 cm in greatest diameter. Recommend
annual imaging followup by CTA or MRA. This recommendation follows
[HB] ACCF/AHA/AATS/ACR/ASA/SCA/MYRTLE/MYRTLE/MYRTLE/MYRTLE Guidelines for the
Diagnosis and Management of Patients with Thoracic Aortic Disease.
Circulation. [HB]; 121: [id number]. Aortic aneurysm NOS ([HB]-[HB])
2. Hepatic steatosis with probable early cirrhosis based on relative
enlargement of the left lobe and evidence of periportal right lobe
atrophy.

Aortic aneurysm NOS ([HB]-[HB]).

## 2018-11-15 MED ORDER — IOPAMIDOL (ISOVUE-370) INJECTION 76%
100.0000 mL | Freq: Once | INTRAVENOUS | Status: AC | PRN
  Administered 2018-11-15: 100 mL via INTRAVENOUS

## 2018-11-29 ENCOUNTER — Ambulatory Visit
Admission: RE | Admit: 2018-11-29 | Discharge: 2018-11-29 | Disposition: A | Discharge status: Home / Self Care | Payer: Medicare Other | Source: Ambulatory Visit | Attending: Internal Medicine | Admitting: Internal Medicine

## 2018-11-29 DIAGNOSIS — Z1231 Encounter for screening mammogram for malignant neoplasm of breast: Secondary | ICD-10-CM

## 2018-11-29 IMAGING — MG DIGITAL SCREENING BILATERAL MAMMOGRAM WITH TOMO AND CAD
8 series · 8 of 24 positions shown · non-contrast
Comparison: Previous exam(s).

CLINICAL DATA: Screening.

EXAM:
DIGITAL SCREENING BILATERAL MAMMOGRAM WITH TOMO AND CAD

[R MLO synth-2D]
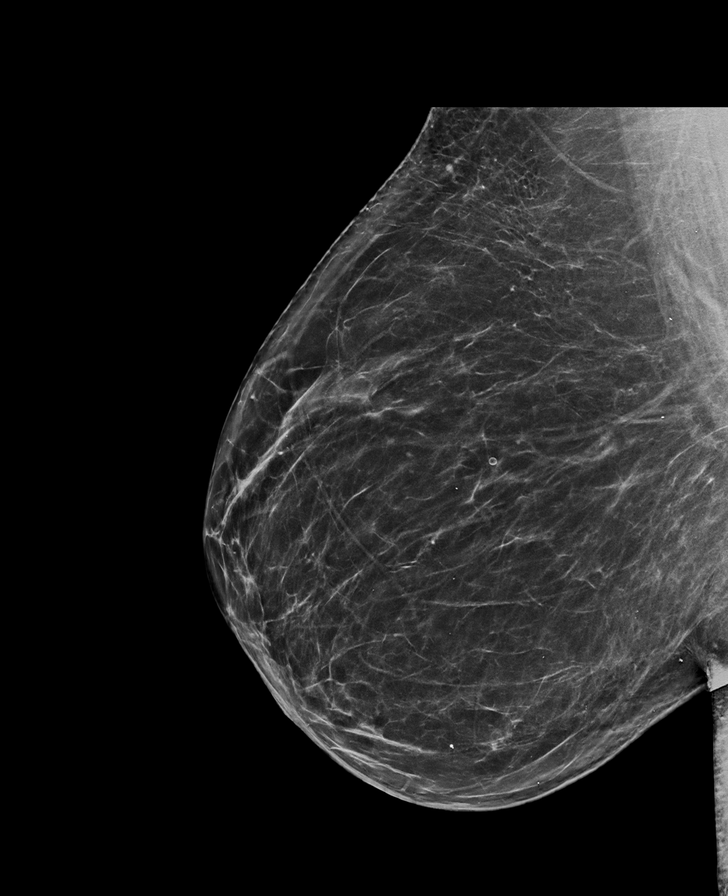

[L MLO synth-2D]
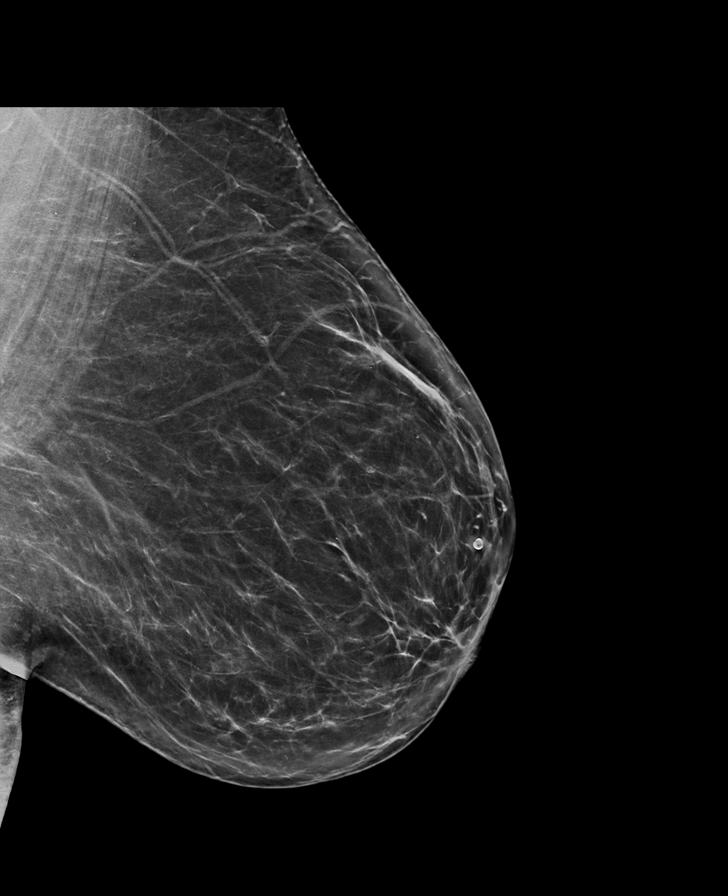

[R CC synth-2D]
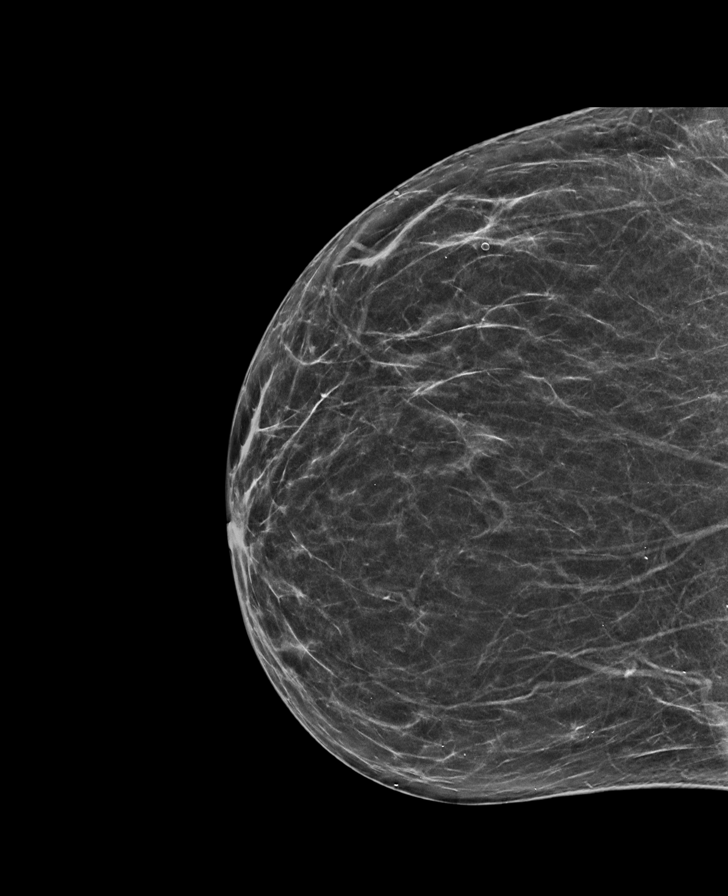

[L CC synth-2D]
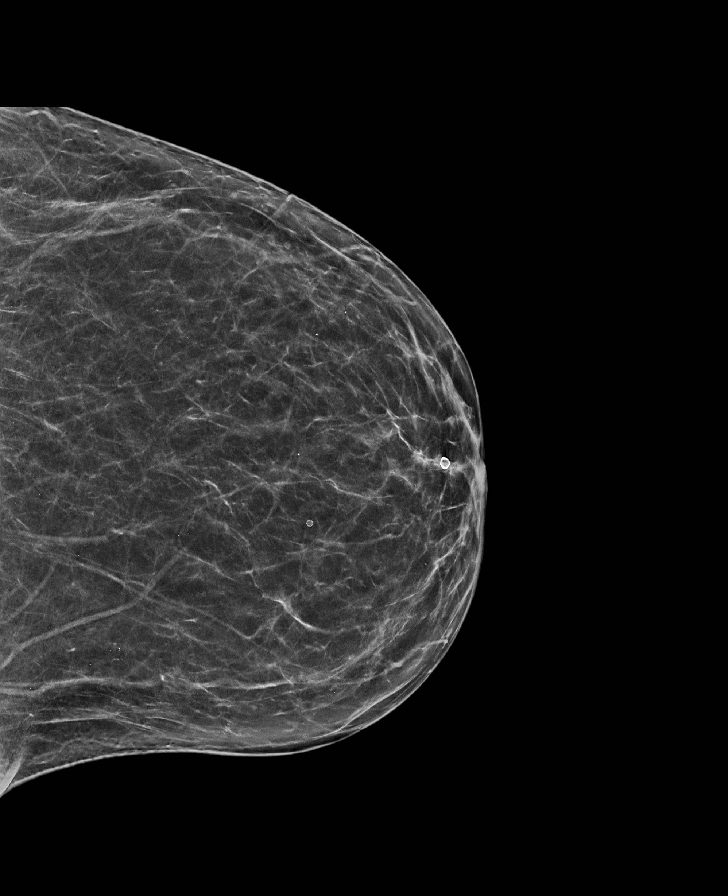

[R CC tomo · tomo slice 34/67.0]
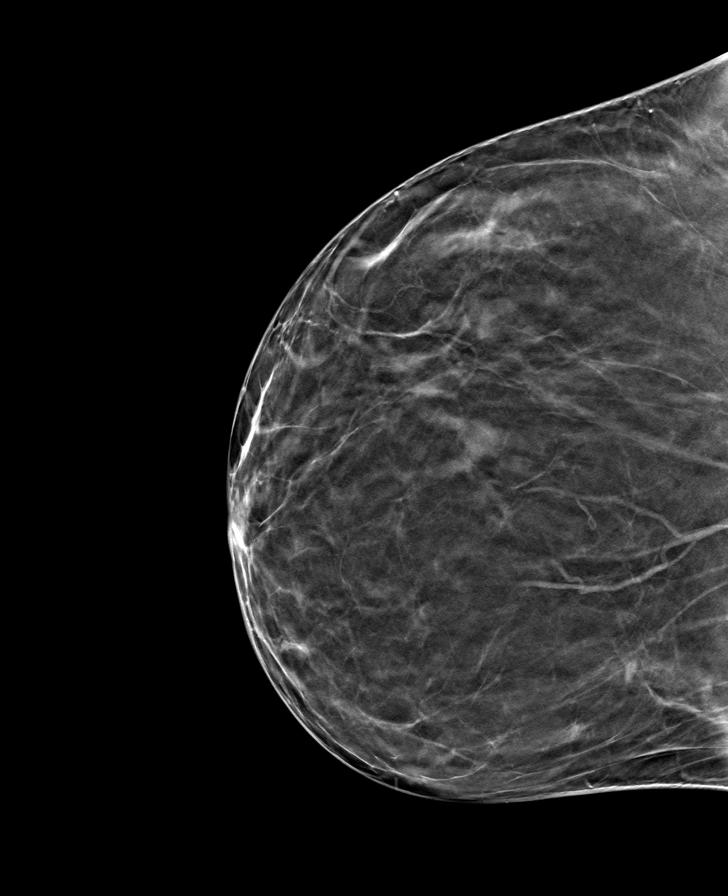

[L MLO tomo · tomo slice 43/84.0]
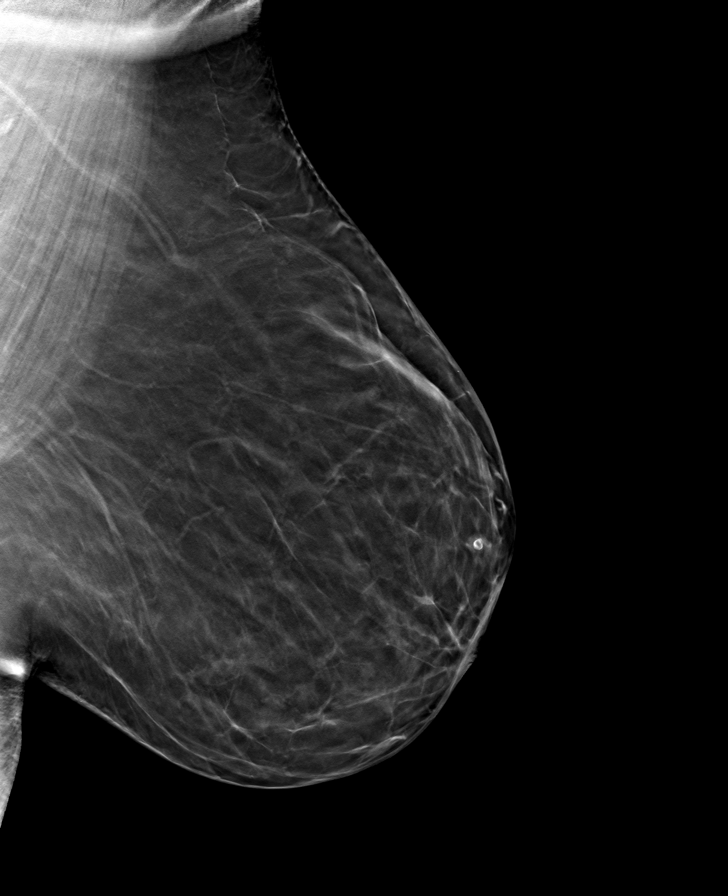

[L CC tomo · tomo slice 35/70.0]
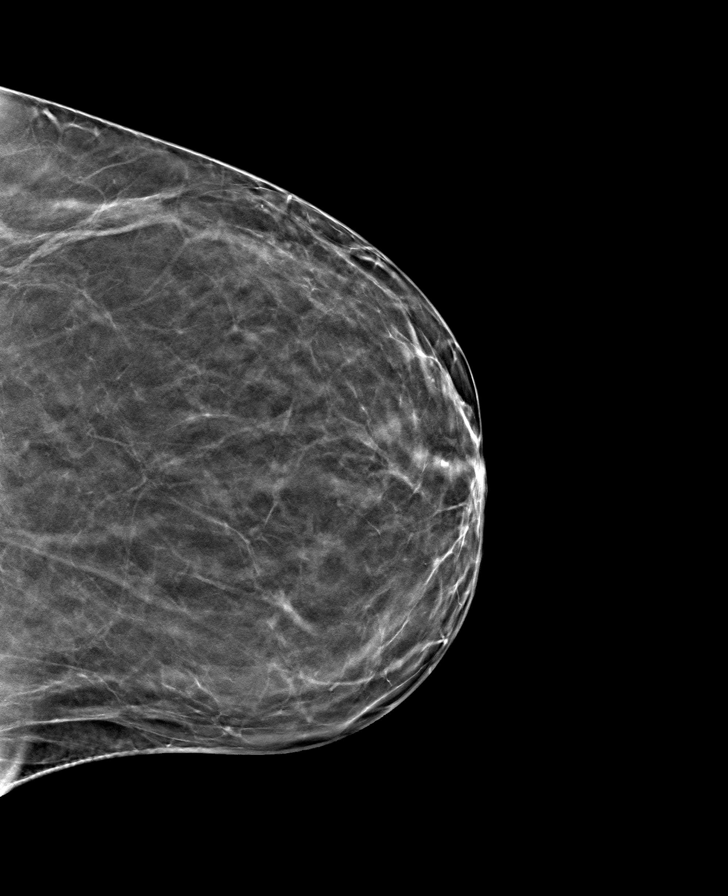

[R MLO tomo · tomo slice 43/84.0]
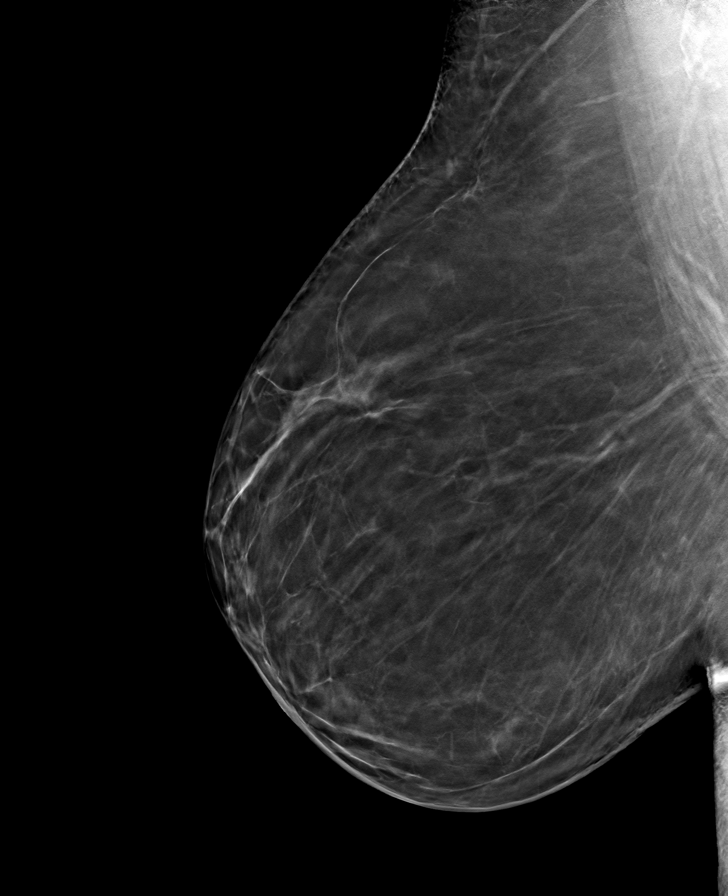

[8 of 24 positions shown; findings below may reference images not displayed]

ACR Breast Density Category b: There are scattered areas of
fibroglandular density.
FINDINGS: There are no findings suspicious for malignancy. Images were
processed with CAD.
IMPRESSION: No mammographic evidence of malignancy. A result letter of this
screening mammogram will be mailed directly to the patient.

RECOMMENDATION:
Screening mammogram in one year. (Code:[TQ])

BI-RADS CATEGORY  1: Negative.

## 2018-12-12 ENCOUNTER — Other Ambulatory Visit: Payer: Self-pay | Admitting: Orthopaedic Surgery

## 2018-12-12 DIAGNOSIS — M5136 Other intervertebral disc degeneration, lumbar region: Secondary | ICD-10-CM

## 2018-12-18 ENCOUNTER — Ambulatory Visit
Admission: RE | Admit: 2018-12-18 | Discharge: 2018-12-18 | Disposition: A | Discharge status: Home / Self Care | Payer: Medicare Other | Source: Ambulatory Visit | Attending: Orthopaedic Surgery | Admitting: Orthopaedic Surgery

## 2018-12-18 DIAGNOSIS — M5136 Other intervertebral disc degeneration, lumbar region: Secondary | ICD-10-CM

## 2018-12-18 IMAGING — MR MR LUMBAR SPINE W/O CM
4 of 5 series · 19 of 48 positions shown · non-contrast
Comparison: CT of the lumbar spine [DATE]

CLINICAL DATA: Back and left leg pain numbness to the left knee.
Disc degeneration, lumbar.

EXAM:
MRI LUMBAR SPINE WITHOUT CONTRAST
TECHNIQUE: Multiplanar, multisequence MR imaging of the lumbar spine was
performed. No intravenous contrast was administered.

[Series 6: T2 · sagittal · 4.0mm · 0.65mm/px · 6 of 13 slices shown (1 of 2)]
[im 1/13]
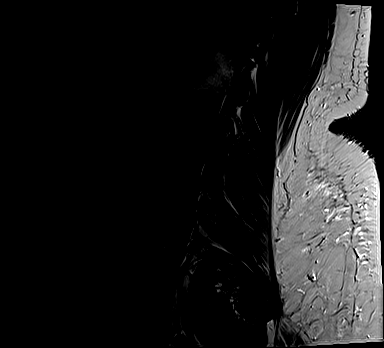
[im 3/13]
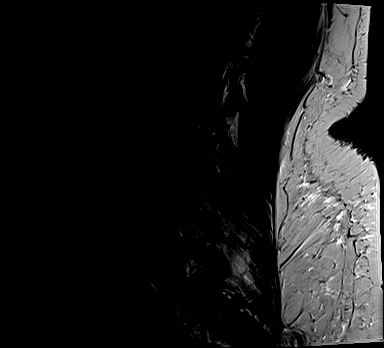
[im 5/13]
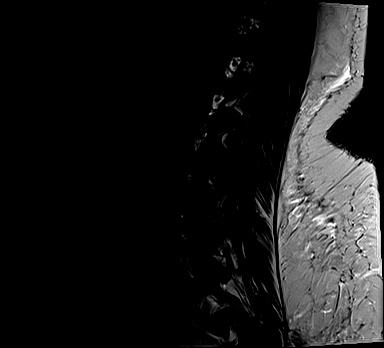
[im 8/13]
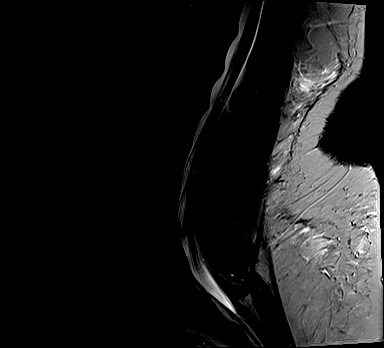
[im 10/13]
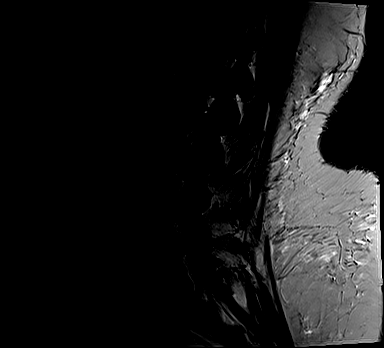
[im 13/13]
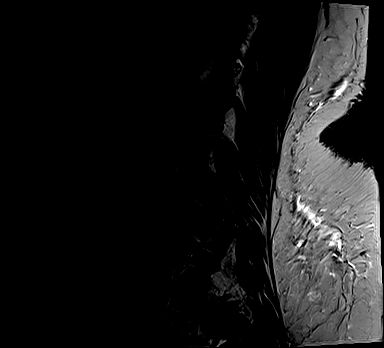

[Series 7: T1 · sagittal · 4.0mm · 0.78mm/px · 3 of 13 slices shown (1 of 2)]
[im 1/13]
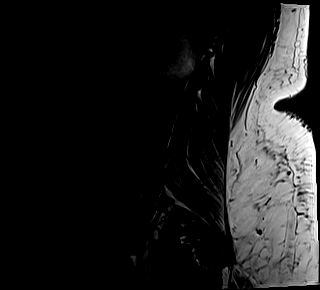
[im 7/13]
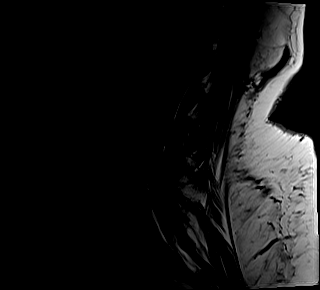
[im 13/13]
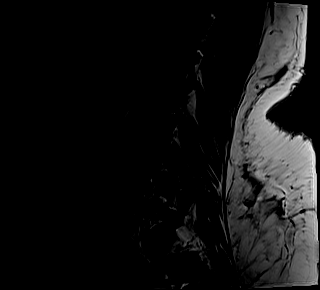

[Series 13: T2 · axial · 4.0mm · 0.28mm/px · z∈[-42,+134]mm · 7 of 38 slices shown (2 of 2)]
[im 3/38]
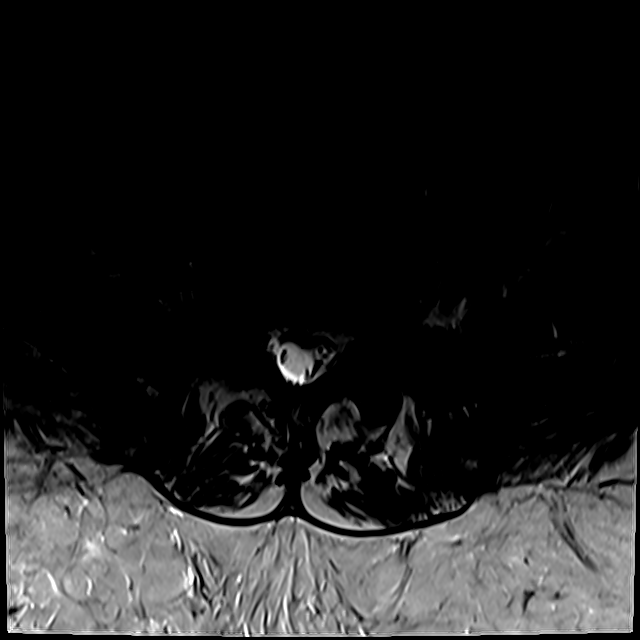
[im 5/38]
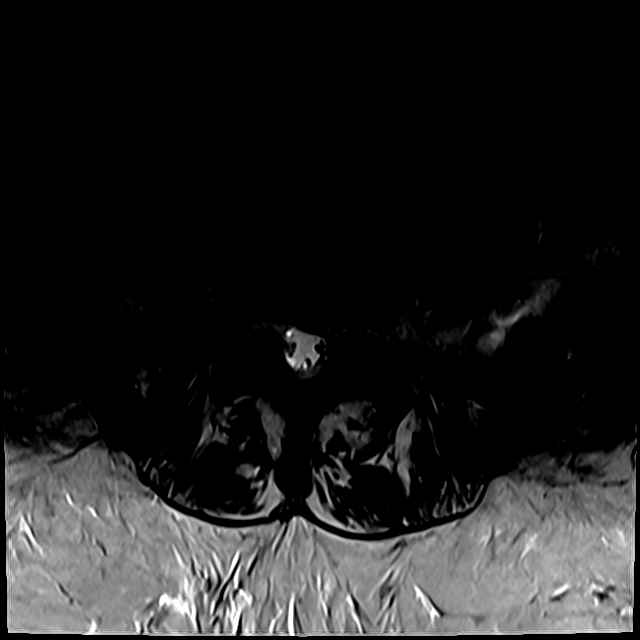
[im 8/38]
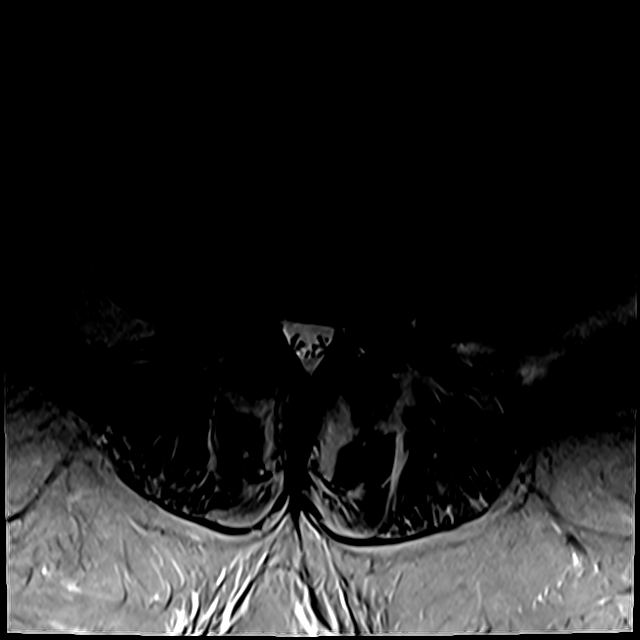
[im 13/38]
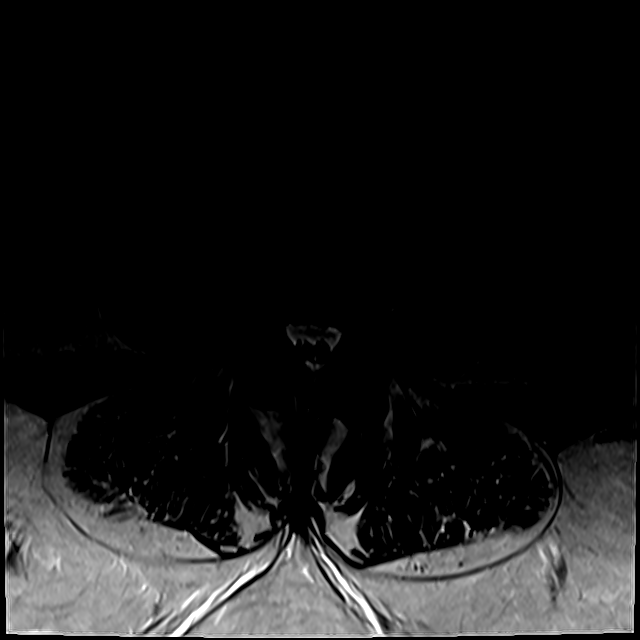
[im 18/38]
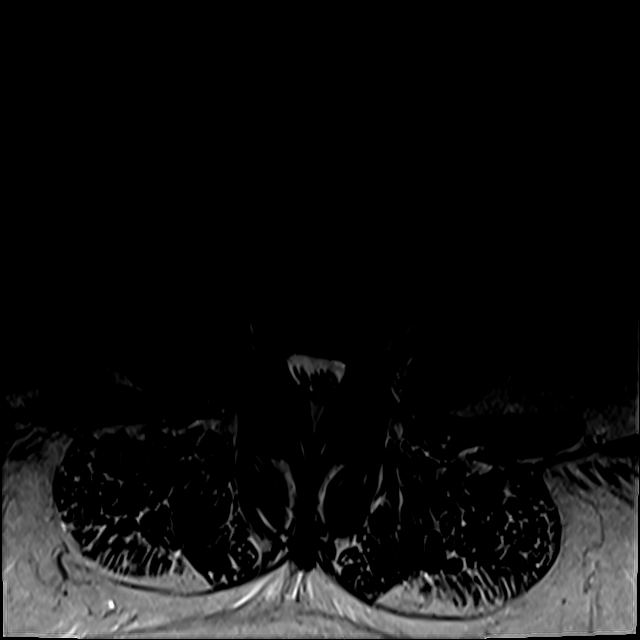
[im 20/38]
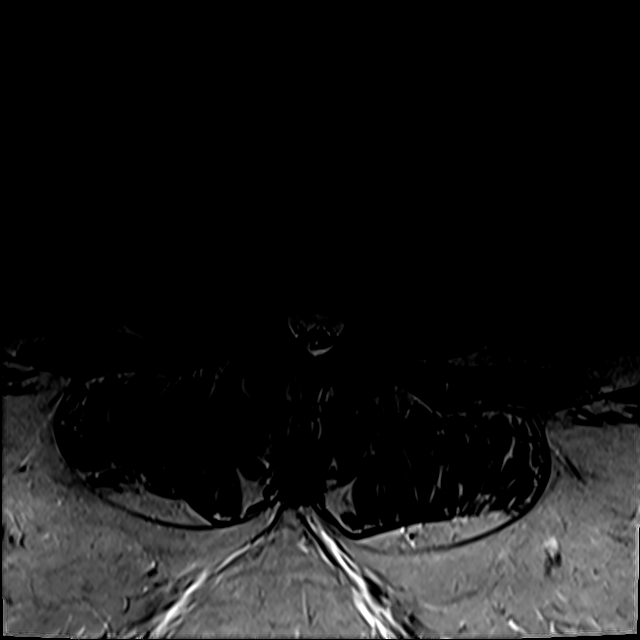
[im 33/38]
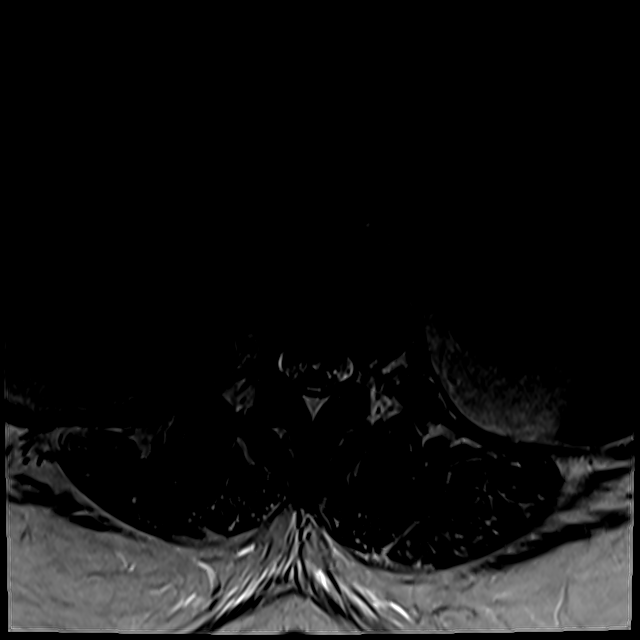

[Series 100: T1 · axial · 4.0mm · 0.28mm/px · z∈[-32,+134]mm · 3 of 38 slices shown (2 of 2)]
[im 5/38]
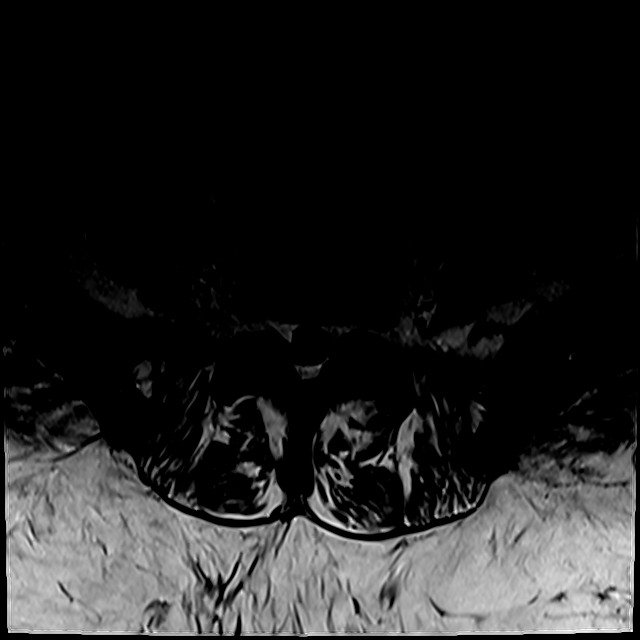
[im 20/38]
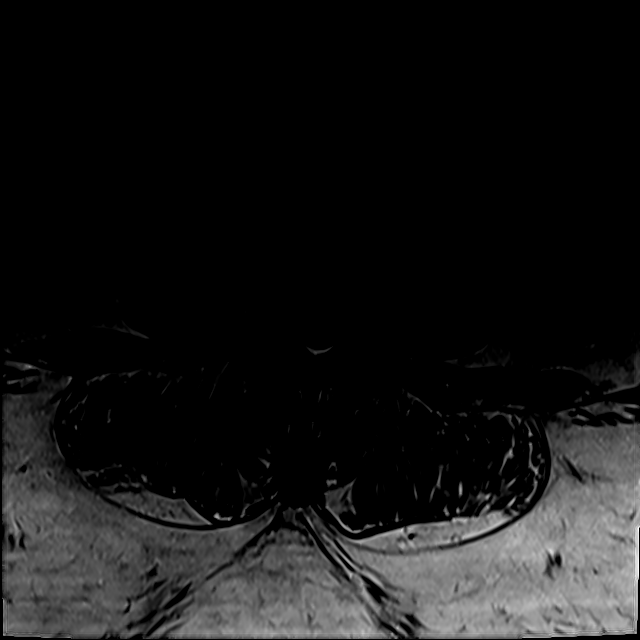
[im 33/38]
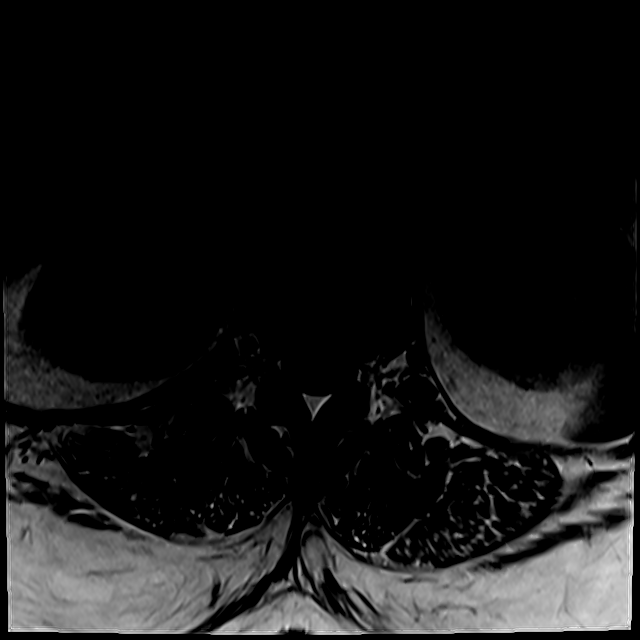

[19 of 48 positions shown; findings below may reference images not displayed]

FINDINGS: Segmentation: 5 non rib-bearing lumbar type vertebral bodies are
present. The lowest fully formed vertebral body is L5.

Alignment: Slight degenerative retrolisthesis is present at L1-2. AP
alignment is otherwise anatomic.

Vertebrae: Mild endplate degenerative changes are present with
Schmorl's nodes.

Conus medullaris and cauda equina: Conus extends to the L1 level.
Conus and cauda equina appear normal.

Paraspinal and other soft tissues: Limited imaging the abdomen is
unremarkable. There is no significant adenopathy. No solid organ
lesions are present.

Disc levels:

L1-2: Mild disc bulging is present. Facet hypertrophy is worse on
the right. No significant stenosis is present.

L2-3: Moderate facet hypertrophy is present bilaterally. There is
some disc bulging without significant stenosis.

L3-4: A broad-based disc protrusion is asymmetric to the left. A
central annular tear is present. The disc extends into the left
neural foramen. Mild left foraminal narrowing is present.

L4-5: Advanced facet hypertrophy is present. There is no significant
stenosis. Central canal is patent.

L5-S1: Advanced facet hypertrophy is worse on the left. No
significant stenosis is present.
IMPRESSION: 1. Mild left foraminal narrowing at L3-4 secondary to a far left
lateral disc protrusion.
2. Multilevel facet hypertrophy as described.
3. No other focal stenosis evident.

## 2018-12-20 ENCOUNTER — Ambulatory Visit (INDEPENDENT_AMBULATORY_CARE_PROVIDER_SITE_OTHER): Payer: Medicare Other

## 2018-12-20 DIAGNOSIS — I714 Abdominal aortic aneurysm, without rupture: Secondary | ICD-10-CM | POA: Diagnosis not present

## 2018-12-20 DIAGNOSIS — I2510 Atherosclerotic heart disease of native coronary artery without angina pectoris: Secondary | ICD-10-CM | POA: Diagnosis not present

## 2018-12-20 MED ORDER — PERFLUTREN LIPID MICROSPHERE: 1.0000 mL | INTRAVENOUS | Status: AC | PRN

## 2018-12-20 NOTE — Progress Notes (Signed)
Complete echocardiogram with contrast has been performed.   Jimmy Armetta Henri RDCS, RVT 

## 2019-03-29 ENCOUNTER — Telehealth: Payer: Self-pay | Admitting: Cardiology

## 2019-03-29 NOTE — Telephone Encounter (Signed)
Virtual Visit Pre-Appointment Phone Call  "(Name), I am calling you today to discuss your upcoming appointment. We are currently trying to limit exposure to the virus that causes COVID-19 by seeing patients at home rather than in the office."  1. "What is the BEST phone number to call the day of the visit?" - include this in appointment notes  2. Do you have or have access to (through a family member/friend) a smartphone with video capability that we can use for your visit?" a. If yes - list this number in appt notes as cell (if different from BEST phone #) and list the appointment type as a VIDEO visit in appointment notes b. If no - list the appointment type as a PHONE visit in appointment notes  3. Confirm consent - "In the setting of the current Covid19 crisis, you are scheduled for a (phone or video) visit with your provider on (date) at (time).  Just as we do with many in-office visits, in order for you to participate in this visit, we must obtain consent.  If you'd like, I can send this to your mychart (if signed up) or email for you to review.  Otherwise, I can obtain your verbal consent now.  All virtual visits are billed to your insurance company just like a normal visit would be.  By agreeing to a virtual visit, we'd like you to understand that the technology does not allow for your provider to perform an examination, and thus may limit your provider's ability to fully assess your condition. If your provider identifies any concerns that need to be evaluated in person, we will make arrangements to do so.  Finally, though the technology is pretty good, we cannot assure that it will always work on either your or our end, and in the setting of a video visit, we may have to convert it to a phone-only visit.  In either situation, we cannot ensure that we have a secure connection.  Are you willing to proceed?" STAFF: Did the patient verbally acknowledge consent to telehealth visit? Document  YES/NO here: YES  4. Advise patient to be prepared - "Two hours prior to your appointment, go ahead and check your blood pressure, pulse, oxygen saturation, and your weight (if you have the equipment to check those) and write them all down. When your visit starts, your provider will ask you for this information. If you have an Apple Watch or Kardia device, please plan to have heart rate information ready on the day of your appointment. Please have a pen and paper handy nearby the day of the visit as well."  5. Give patient instructions for MyChart download to smartphone OR Doximity/Doxy.me as below if video visit (depending on what platform provider is using)  6. Inform patient they will receive a phone call 15 minutes prior to their appointment time (may be from unknown caller ID) so they should be prepared to answer    TELEPHONE CALL NOTE  Alyssa Rosales has been deemed a candidate for a follow-up tele-health visit to limit community exposure during the Covid-19 pandemic. I spoke with the patient via phone to ensure availability of phone/video source, confirm preferred email & phone number, and discuss instructions and expectations.  I reminded Alyssa Rosales to be prepared with any vital sign and/or heart rhythm information that could potentially be obtained via home monitoring, at the time of her visit. I reminded Alyssa Rosales to expect a phone call prior to  her visit.  Kittie Plater 03/29/2019 8:31 AM   INSTRUCTIONS FOR DOWNLOADING THE MYCHART APP TO SMARTPHONE  - The patient must first make sure to have activated MyChart and know their login information - If Apple, go to [employer] and type in MyChart in the search bar and download the app. If Android, ask patient to go to [employer] and type in [place] in the search bar and download the app. The app is free but as with any other app downloads, their phone may require them to verify saved payment information or  Apple/Android password.  - The patient will need to then log into the app with their MyChart username and password, and select [facility] as their healthcare provider to link the account. When it is time for your visit, go to the MyChart app, find appointments, and click Begin Video Visit. Be sure to Select Allow for your device to access the Microphone and Camera for your visit. You will then be connected, and your provider will be with you shortly.  **If they have any issues connecting, or need assistance please contact MyChart service desk [phone])**  **If using a computer, in order to ensure the best quality for their visit they will need to use either of the following Internet Browsers: [employer] or Google Chrome**  IF USING DOXIMITY or DOXY.ME - The patient will receive a link just prior to their visit by text.     FULL LENGTH CONSENT FOR TELE-HEALTH VISIT   I hereby voluntarily request, consent and authorize [facility] and its employed or contracted physicians, physician assistants, nurse practitioners or other licensed health care professionals (the Practitioner), to provide me with telemedicine health care services (the Services") as deemed necessary by the treating Practitioner. I acknowledge and consent to receive the Services by the Practitioner via telemedicine. I understand that the telemedicine visit will involve communicating with the Practitioner through live audiovisual communication technology and the disclosure of certain medical information by electronic transmission. I acknowledge that I have been given the opportunity to request an in-person assessment or other available alternative prior to the telemedicine visit and am voluntarily participating in the telemedicine visit.  I understand that I have the right to withhold or withdraw my consent to the use of telemedicine in the course of my care at any time, without affecting my right to future care  or treatment, and that the Practitioner or I may terminate the telemedicine visit at any time. I understand that I have the right to inspect all information obtained and/or recorded in the course of the telemedicine visit and may receive copies of available information for a reasonable fee.  I understand that some of the potential risks of receiving the Services via telemedicine include:   Delay or interruption in medical evaluation due to technological equipment failure or disruption;  Information transmitted may not be sufficient (e.g. poor resolution of images) to allow for appropriate medical decision making by the Practitioner; and/or   In rare instances, security protocols could fail, causing a breach of personal health information.  Furthermore, I acknowledge that it is my responsibility to provide information about my medical history, conditions and care that is complete and accurate to the best of my ability. I acknowledge that Practitioner's advice, recommendations, and/or decision may be based on factors not within their control, such as incomplete or inaccurate data provided by me or distortions of diagnostic images or specimens that may result from electronic transmissions. I  understand that the practice of medicine is not an exact science and that Practitioner makes no warranties or guarantees regarding treatment outcomes. I acknowledge that I will receive a copy of this consent concurrently upon execution via email to the email address I last provided but may also request a printed copy by calling the office of [place].    I understand that my insurance will be billed for this visit.   I have read or had this consent read to me.  I understand the contents of this consent, which adequately explains the benefits and risks of the Services being provided via telemedicine.   I have been provided ample opportunity to ask questions regarding this consent and the Services and have had  my questions answered to my satisfaction.  I give my informed consent for the services to be provided through the use of telemedicine in my medical care  By participating in this telemedicine visit I agree to the above.

## 2019-04-09 ENCOUNTER — Encounter: Payer: Self-pay | Admitting: Cardiology

## 2019-04-09 ENCOUNTER — Other Ambulatory Visit: Payer: Self-pay

## 2019-04-09 ENCOUNTER — Telehealth (INDEPENDENT_AMBULATORY_CARE_PROVIDER_SITE_OTHER): Payer: Medicare Other | Admitting: Cardiology

## 2019-04-09 VITALS — Wt 220.0 lb

## 2019-04-09 DIAGNOSIS — I2510 Atherosclerotic heart disease of native coronary artery without angina pectoris: Secondary | ICD-10-CM | POA: Diagnosis not present

## 2019-04-09 DIAGNOSIS — I10 Essential (primary) hypertension: Secondary | ICD-10-CM

## 2019-04-09 DIAGNOSIS — I712 Thoracic aortic aneurysm, without rupture, unspecified: Secondary | ICD-10-CM

## 2019-04-09 DIAGNOSIS — R072 Precordial pain: Secondary | ICD-10-CM

## 2019-04-09 DIAGNOSIS — Z6841 Body Mass Index (BMI) 40.0 and over, adult: Secondary | ICD-10-CM

## 2019-04-09 NOTE — Patient Instructions (Signed)
Medication Instructions:  Your physician recommends that you continue on your current medications as directed. Please refer to the Current Medication list given to you today.  If you need a refill on your cardiac medications before your next appointment, please call your pharmacy.   Lab work: None.  If you have labs (blood work) drawn today and your tests are completely normal, you will receive your results only by: . MyChart Message (if you have MyChart) OR . A paper copy in the mail If you have any lab test that is abnormal or we need to change your treatment, we will call you to review the results.  Testing/Procedures: None.   Follow-Up: At [facility], you and your health needs are our priority.  As part of our continuing mission to provide you with exceptional heart care, we have created designated Provider Care Teams.  These Care Teams include your primary Cardiologist (physician) and Advanced Practice Providers (APPs -  Physician Assistants and Nurse Practitioners) who all work together to provide you with the care you need, when you need it. You will need a follow up appointment in 5 months.  Please call our office 2 months in advance to schedule this appointment.  You may see No primary care provider on file. or another member of our [facility] Provider Team in [place]: Brian Munley, MD . Rajan Revankar, MD  Any Other Special Instructions Will Be Listed Below (If Applicable).    

## 2019-04-09 NOTE — Progress Notes (Signed)
Virtual Visit via Telephone Note   This visit type was conducted due to national recommendations for restrictions regarding the COVID-19 Pandemic (e.g. social distancing) in an effort to limit this patient's exposure and mitigate transmission in our community.  Due to her co-morbid illnesses, this patient is at least at moderate risk for complications without adequate follow up.  This format is felt to be most appropriate for this patient at this time.  The patient did not have access to video technology/had technical difficulties with video requiring transitioning to audio format only (telephone).  All issues noted in this document were discussed and addressed.  No physical exam could be performed with this format.  Please refer to the patient's chart for her  consent to telehealth for [facility].  Evaluation Performed:  Follow-up visit  This visit type was conducted due to national recommendations for restrictions regarding the COVID-19 Pandemic (e.g. social distancing).  This format is felt to be most appropriate for this patient at this time.  All issues noted in this document were discussed and addressed.  No physical exam was performed (except for noted visual exam findings with Video Visits).  Please refer to the patient's chart (MyChart message for video visits and phone note for telephone visits) for the patient's consent to telehealth for [facility].  Date:  04/09/2019  ID: Alyssa Rosales, DOB 02-21-1951, MRN [id number]   Patient Location: [place] [id number]   Provider location:   [place] Office  PCP:  Raina Mina., MD  Cardiologist:  Jenne Campus, MD     Chief Complaint: Doing well  History of Present Illness:    Alyssa Rosales is a 68 y.o. female  who presents via audio/video conferencing for a telehealth visit today.  Past medical history significant for ascending thoracic aneurysm measuring 4.3 cm based on last  CT that was done in January 15.  History of coronary artery disease with cardiac catheterization many years ago showing only nonobstructive disease, dyslipidemia, hypertension overall doing well still works in spite of the fact she is cannot be 50 next year.  Denies having any chest pain tightness squeezing pressure burning chest no swelling of lower extremities.  Overall doing well.   The patient does not have symptoms concerning for COVID-19 infection (fever, chills, cough, or new SHORTNESS OF BREATH).    Prior CV studies:   The following studies were reviewed today:  CT of her chest to look at the ascending aortic aneurysm showed aneurysm measuring 4.3 cm.     Past Medical History:  Diagnosis Date  . Abdominal aortic aneurysm (AAA) without rupture ([place]) 09/01/2017  . Coronary artery disease involving native coronary artery of native heart without angina pectoris 02/09/2017  . GERD without esophagitis 01/07/2016   Last Assessment & Plan:  Continue with the PPI and have refilled this for her  . Hyperlipidemia 01/27/2017   Overview:  Added automatically from request for surgery [phone]  . Hypertension   . Moderate persistent asthma without complication 95/62/1308  . OSA (obstructive sleep apnea) 09/30/2016   Follows with pulmonary is seen 12/19 for FU bipap 21/15    Past Surgical History:  Procedure Laterality Date  . CORONARY ANGIOPLASTY    . RECTAL SURGERY    . TOOTH EXTRACTION       Current Meds  Medication Sig  . aspirin EC 81 MG tablet Take 2 tablets by mouth daily.  Marland Kitchen atorvastatin (LIPITOR) 10 MG tablet  Take 1 tablet (10 mg total) by mouth daily.  . budesonide-formoterol (SYMBICORT) 160-4.5 MCG/ACT inhaler Inhale 2 puffs into the lungs 2 (two) times daily.  . fluticasone (FLONASE) 50 MCG/ACT nasal spray Place 1 spray into the nose daily.  Marland Kitchen. HYDROcodone-acetaminophen (NORCO/VICODIN) 5-325 MG tablet Take 1-2 tablets by mouth every 6 (six) hours as needed.  Marland Kitchen. losartan  (COZAAR) 50 MG tablet Take 2 tablets by mouth daily.  Marland Kitchen. omeprazole (PRILOSEC) 20 MG capsule Take 1 capsule by mouth 2 (two) times daily.  Marland Kitchen. torsemide (DEMADEX) 10 MG tablet Take 1 tablet by mouth 2 (two) times daily.  . Vitamin D, Ergocalciferol, (DRISDOL) 1.25 MG (50000 UT) CAPS capsule Take 1 capsule by mouth once a week.      Family History: The patient's family history includes Alzheimer's disease in her mother. There is no history of Breast cancer.   ROS:   Please see the history of present illness.     All other systems reviewed and are negative.   Labs/Other Tests and Data Reviewed:     Recent Labs: 11/09/2018: BUN 27; Creatinine, Ser 0.99; Potassium 4.4; Sodium 143  Recent Lipid Panel    Component Value Date/Time   CHOL 184 11/09/2018 1040   TRIG 138 11/09/2018 1040   HDL 45 11/09/2018 1040   CHOLHDL 4.1 11/09/2018 1040   LDLCALC 111 (H) 11/09/2018 1040      Exam:    Vital Signs:  Wt 220 lb (99.8 kg)   BMI 48.45 kg/m     Wt Readings from Last 3 Encounters:  04/09/19 220 lb (99.8 kg)  11/09/18 222 lb (100.7 kg)     Well nourished, well developed in no acute distress. Alert awake oriented x3 unable to establish video link therefore we only talk over the phone denies having any chest pain tightness pressure in the chest.  Not in any distress  Diagnosis for this visit:   1. Coronary artery disease involving native coronary artery of native heart without angina pectoris   2. Essential hypertension   3. Thoracic aortic aneurysm without rupture (HCC)   4. Morbid obesity with BMI of 45.0-49.9, adult (HCC)   5. Precordial pain      ASSESSMENT & PLAN:    1.  Coronary disease only luminal by cardiac catheterization many years ago asymptomatic still working hard I have no difficulty doing it.  We will continue that. 2.  Essential hypertension recent changes in her medication include splitting losartan and a half and taking 50 in the morning 50 in the afternoon.   She did not check her blood pressure yet. 3.  Thoracic aneurysm 4.3 cm we will continue monitoring she will need another CT of her chest next year. 4.  Morbid obesity noted she understands she is to lose some weight. 5.  Precordial pain.  Denies having any.  COVID-19 Education: The signs and symptoms of COVID-19 were discussed with the patient and how to seek care for testing (follow up with PCP or arrange E-visit).  The importance of social distancing was discussed today.  Patient Risk:   After full review of this patients clinical status, I feel that they are at least moderate risk at this time.  Time:   Today, I have spent 18 minutes with the patient with telehealth technology discussing pt health issues.  I spent 5 minutes reviewing her chart before the visit.  Visit was finished at 8:30 AM.    Medication Adjustments/Labs and Tests Ordered: Current medicines are reviewed  at length with the patient today.  Concerns regarding medicines are outlined above.  No orders of the defined types were placed in this encounter.  Medication changes: No orders of the defined types were placed in this encounter.    Disposition: Follow-up 5 months  Signed, Georgeanna Leaobert J. Binyomin Brann, MD, [facility]ACC 04/09/2019 8:30 AM    [facility]

## 2019-04-19 DIAGNOSIS — E0590 Thyrotoxicosis, unspecified without thyrotoxic crisis or storm: Secondary | ICD-10-CM

## 2019-04-19 HISTORY — DX: Thyrotoxicosis, unspecified without thyrotoxic crisis or storm: E05.90

## 2019-06-06 NOTE — Progress Notes (Signed)
Cardiology Office Note:    Date:  06/06/2019   ID:  Alyssa CasaLeatrice C Hinderer, DOB 1951/10/10, MRN [id number]  PCP:  Gordan PaymentGrisso, Greg A., MD  Cardiologist:  Norman HerrlichBrian Wake Conlee, MD    Referring MD: Gordan PaymentGrisso, Greg A., MD    ASSESSMENT:    1. Coronary artery disease involving native coronary artery of native heart without angina pectoris   2. Essential hypertension   3. Thoracic aortic aneurysm without rupture (HCC)   4. OSA (obstructive sleep apnea)   5. Mixed hyperlipidemia    PLAN:    In order of problems listed above:  1. CAD - Cath 2018 with mild nonobstructive CAD. Endorses episodes of chest pain last week. Occurred for 2-3 days. Noted in L chest without radiation. New prescription for Nitroglycerin PRN. GDMT aspirin, statin - Toprol XL 25mg  daily added today for intensification of guideline directed therapy. Low suspicion ACS, but will collect Troponin today to rule out MI. Symptoms are very consistent with GERD, as below. No acute ST/T changes on EKG today.  2. GERD  - Endorses worsening GERD and belching over the past few weeks. Has been taking her omeprazole twice daily at the recommendation of her PCP. Would consider change to Pantoprazole - she has an appointment with them this afternoon and will discuss.  3. Hypertension - BP elevated today. Tells me it goes "up and down" at home. Asked to keep a record and call our office if consistently <130/80. Started Toprol XL 25mg  daily today - if BP remains uncontrolled could consider changing to Coreg.  4. Thoracic aortic aneurysm -CT 11/2018 with a stable measurement of 43 mm recommended for annual follow-up.Due to her episode of chest pain and elevated BP we will recheck. There is questionable history of abdominal aortic aneurysm so we will get CT angio chest and abdomen with contrast to define this.  5. OSA - Stable. Compliant with her CPAP.  6. Hyperlipidemia -lipid profile 04/18/2019 with total cholesterol 97, LDL 115, HDL 45, triglycerides 191.  Normal  liver function, but noted hepatic steatosis on CT 11/2018.    Next appointment: 1 month with Dr. Kirtland BouchardK   Medication Adjustments/Labs and Tests Ordered: Current medicines are reviewed at length with the patient today.  Concerns regarding medicines are outlined above.  No orders of the defined types were placed in this encounter.  No orders of the defined types were placed in this encounter.   No chief complaint on file.   History of Present Illness:    Alyssa Rosales is a 68 y.o. female with a hx of ascending thoracic aneurysm measuring 4.3 cm based on CT 11/15/18 recommended for annual imaging, nonobstructive CAD by cardiac cath 01/2017, DLD, HTN, OSA on CPAP. Additional medical history includes restless leg syndrome, degenerative disc disease, restrictive lung disease, asthma. She follows with orthopedics and pulmonology.  Last seen 04/09/2019 by Dr. Bing MatterKrasowski.  She is being evaluated for overactive thyroid by her PCP.  Hepatic steatosis with probably early cirrhosis was incidentally noted on her CT 11/15/18. Echo 12/2018 with Ef 60-65%, impaired LV relaxation, normal RV, RA mildly dilated, ascend aorta measuring 46mm.   Presents today for evaluation of intermittent chest pain. She reports last week she had 2-3 days with episodes of chest pain. Was in left chest, did not radiate. Not associated with SOB nor palpitations. Relieved by rest. Did not have nitroglycerin so did not take any.   She does also endorse that her GERD has been worse over the last few weeks.  She is having constant reflux and even "throws up acid" sometimes in the morning. She endorses she has been belching a lot and thinks this may be the cause of her chest pain.  She denies DOE, SOB, palpitations, edema.   Compliance with diet, lifestyle and medications: Yes Past Medical History:  Diagnosis Date  . Abdominal aortic aneurysm (AAA) without rupture ([place]) 09/01/2017  . Coronary artery disease involving native coronary  artery of native heart without angina pectoris 02/09/2017  . GERD without esophagitis 01/07/2016   Last Assessment & Plan:  Continue with the PPI and have refilled this for her  . Hyperlipidemia 01/27/2017   Overview:  Added automatically from request for surgery [phone]  . Hypertension   . Moderate persistent asthma without complication 46/96/2952  . OSA (obstructive sleep apnea) 09/30/2016   Follows with pulmonary is seen 12/19 for FU bipap 21/15    Past Surgical History:  Procedure Laterality Date  . CORONARY ANGIOPLASTY    . RECTAL SURGERY    . TOOTH EXTRACTION      Current Medications: No outpatient medications have been marked as taking for the 06/07/19 encounter (Appointment) with Richardo Priest, MD.     Allergies:   Amoxicillin-pot clavulanate, Penicillins, Propoxyphene, and Sulfamethoxazole   Social History   Socioeconomic History  . Marital status: Divorced    Spouse name: Not on file  . Number of children: Not on file  . Years of education: Not on file  . Highest education level: Not on file  Occupational History  . Not on file  Social Needs  . Financial resource strain: Not on file  . Food insecurity    Worry: Not on file    Inability: Not on file  . Transportation needs    Medical: Not on file    Non-medical: Not on file  Tobacco Use  . Smoking status: Never Smoker  . Smokeless tobacco: Never Used  Substance and Sexual Activity  . Alcohol use: Never    Frequency: Never  . Drug use: Never  . Sexual activity: Not on file  Lifestyle  . Physical activity    Days per week: Not on file    Minutes per session: Not on file  . Stress: Not on file  Relationships  . Social Herbalist on phone: Not on file    Gets together: Not on file    Attends religious service: Not on file    Active member of club or organization: Not on file    Attends meetings of clubs or organizations: Not on file    Relationship status: Not on file  Other Topics Concern  .  Not on file  Social History Narrative  . Not on file     Family History: The patient's family history includes Alzheimer's disease in her mother. There is no history of Breast cancer. ROS:   Please see the history of present illness.    All other systems reviewed and are negative.  EKGs/Labs/Other Studies Reviewed:    The following studies were reviewed today: CT Angio Chest 11/15/18 IMPRESSION: 1. Stable aneurysmal dilatation of the ascending thoracic aorta measuring approximately 4.3 cm in greatest diameter. Recommend annual imaging followup by CTA or MRA. This recommendation follows 2010 ACCF/AHA/AATS/ACR/ASA/SCA/SCAI/SIR/STS/SVM Guidelines for the Diagnosis and Management of Patients with Thoracic Aortic Disease. Circulation. 2010; 121: W413-K440. Aortic aneurysm NOS (ICD10-I71.9) 2. Hepatic steatosis with probable early cirrhosis based on relative enlargement of the left lobe and evidence of periportal right  lobe atrophy.  Cardiac cath 01/2017 Conclusions 1.mild nonobstructive CAD and 2.normal LV size and function 3.mildly dilated aortic root Findings Right dominant coronary artery system Lesion on mid LCx 40% stenosis EF 55% LMCA, LAD, RCA 0% stenosis  EKG:  EKG ordered today and personally reviewed.  The ekg ordered today demonstrates SR rate 72 no acute ST/T wave changes.   Recent Labs: 04/18/2019 via care everywhere: BUN 29, creatinine 0.88, alkaline phosphatase 61, K4.1, AST 14, ALT 10, TSH 0.307, GFR 67, hemoglobin 13.4 11/09/2018: BUN 27; Creatinine, Ser 0.99; Potassium 4.4; Sodium 143  Recent Lipid Panel 04/18/2019 via care everywhere: Total cholesterol 197, triglycerides 191, LDL 115, HDL 45    Component Value Date/Time   CHOL 184 11/09/2018 1040   TRIG 138 11/09/2018 1040   HDL 45 11/09/2018 1040   CHOLHDL 4.1 11/09/2018 1040   LDLCALC 111 (H) 11/09/2018 1040    Physical Exam:    VS:  There were no vitals taken for this visit.    Wt Readings from Last 3  Encounters:  04/09/19 220 lb (99.8 kg)  11/09/18 222 lb (100.7 kg)     GEN:  Well nourished, overweight, well developed in no acute distress HEENT: Normal NECK: No JVD; No carotid bruits LYMPHATICS: No lymphadenopathy CARDIAC: RRR, no murmurs, rubs, gallops RESPIRATORY:  Clear to auscultation without rales, wheezing or rhonchi  ABDOMEN: Soft, non-tender, non-distended MUSCULOSKELETAL:  No edema; No deformity  SKIN: Warm and dry NEUROLOGIC:  Alert and oriented x 3 PSYCHIATRIC:  Normal affect    Signed, Norman HerrlichBrian Ahnaf Caponi, MD  06/06/2019 9:19 PM    [facility]

## 2019-06-07 ENCOUNTER — Encounter: Payer: Self-pay | Admitting: Cardiology

## 2019-06-07 ENCOUNTER — Ambulatory Visit (INDEPENDENT_AMBULATORY_CARE_PROVIDER_SITE_OTHER): Payer: Medicare Other | Admitting: Cardiology

## 2019-06-07 ENCOUNTER — Other Ambulatory Visit: Payer: Self-pay

## 2019-06-07 VITALS — BP 138/80 | HR 70 | Ht <= 58 in | Wt 217.8 lb

## 2019-06-07 DIAGNOSIS — I10 Essential (primary) hypertension: Secondary | ICD-10-CM

## 2019-06-07 DIAGNOSIS — I712 Thoracic aortic aneurysm, without rupture, unspecified: Secondary | ICD-10-CM

## 2019-06-07 DIAGNOSIS — G4733 Obstructive sleep apnea (adult) (pediatric): Secondary | ICD-10-CM

## 2019-06-07 DIAGNOSIS — I2510 Atherosclerotic heart disease of native coronary artery without angina pectoris: Secondary | ICD-10-CM | POA: Diagnosis not present

## 2019-06-07 DIAGNOSIS — E782 Mixed hyperlipidemia: Secondary | ICD-10-CM

## 2019-06-07 DIAGNOSIS — Z01812 Encounter for preprocedural laboratory examination: Secondary | ICD-10-CM

## 2019-06-07 LAB — BASIC METABOLIC PANEL
BUN/Creatinine Ratio: 30 — ABNORMAL HIGH (ref 12–28)
BUN: 30 mg/dL — ABNORMAL HIGH (ref 8–27)
CO2: 21 mmol/L (ref 20–29)
Calcium: 9 mg/dL (ref 8.7–10.3)
Chloride: 106 mmol/L (ref 96–106)
Creatinine, Ser: 1.01 mg/dL — ABNORMAL HIGH (ref 0.57–1.00)
GFR calc Af Amer: 66 mL/min/{1.73_m2} (ref >59)
GFR calc non Af Amer: 57 mL/min/{1.73_m2} — ABNORMAL LOW (ref >59)
Glucose: 135 mg/dL — ABNORMAL HIGH (ref 65–99)
Potassium: 3.5 mmol/L (ref 3.5–5.2)
Sodium: 145 mmol/L — ABNORMAL HIGH (ref 134–144)

## 2019-06-07 LAB — TROPONIN I: Troponin I: <0.01 ng/mL (ref 0.00–0.04)

## 2019-06-07 MED ORDER — NITROGLYCERIN 0.4 MG SL SUBL: 0.4000 mg | SUBLINGUAL_TABLET | SUBLINGUAL | 3 refills | Status: DC | PRN

## 2019-06-07 MED ORDER — METOPROLOL SUCCINATE ER 25 MG PO TB24: 25.0000 mg | ORAL_TABLET | Freq: Every day | ORAL | 2 refills | Status: DC

## 2019-06-07 NOTE — Addendum Note (Signed)
Addended by: Austin Miles on: 06/07/2019 11:54 AM   Modules accepted: Orders

## 2019-06-07 NOTE — Patient Instructions (Signed)
Medication Instructions:  START Toprol 25mg  daily.   If you need a refill on your cardiac medications before your next appointment, please call your pharmacy.   Lab work: Troponin today. This will tell us whether your heart is under stress. BMP today to assess kidney function.  If you have labs (blood work) drawn today and your tests are completely normal, you will receive your results only by: Marland Kitchen MyChart Message (if you have MyChart) OR . A paper copy in the mail If you have any lab test that is abnormal or we need to change your treatment, we will call you to review the results.  Testing/Procedures: CT chest and abdomen to reassess aneurysm ordered.  Follow-Up: At [facility], you and your health needs are our priority.  As part of our continuing mission to provide you with exceptional heart care, we have created designated Provider Care Teams.  These Care Teams include your primary Cardiologist (physician) and Advanced Practice Providers (APPs -  Physician Assistants and Nurse Practitioners) who all work together to provide you with the care you need, when you need it. You will need a follow up appointment in 1 months.  Please call our office 2 months in advance to schedule this appointment.  You may see Jenne Campus, MD or another member of our [place] Provider Team in [place]: Shirlee More, MD . Jyl Heinz, MD  Any Other Special Instructions Will Be Listed Below (If Applicable).   We added Toprol XL 25 mg daily today. This is guideline directed therapy for coronary artery disease.   Please check your blood pressure daily. If readings are consistently <130/80 please call our office. Blood pressure control is very important due to your aneurysm.   Talk with your PCP regarding your acid reflux. They can see our note from today in their electronic medical record.   Metoprolol extended-release tablets What is this medicine? METOPROLOL (me TOE proe lole) is a  beta-blocker. Beta-blockers reduce the workload on the heart and help it to beat more regularly. This medicine is used to treat high blood pressure and to prevent chest pain. It is also used to after a heart attack and to prevent an additional heart attack from occurring. This medicine may be used for other purposes; ask your health care provider or pharmacist if you have questions. COMMON BRAND NAME(S): toprol, Toprol XL What should I tell my health care provider before I take this medicine? They need to know if you have any of these conditions:  diabetes  heart or vessel disease like slow heart rate, worsening heart failure, heart block, sick sinus syndrome or Raynaud's disease  kidney disease  liver disease  lung or breathing disease, like asthma or emphysema  pheochromocytoma  thyroid disease  an unusual or allergic reaction to metoprolol, other beta-blockers, medicines, foods, dyes, or preservatives  pregnant or trying to get pregnant  breast-feeding How should I use this medicine? Take this medicine by mouth with a glass of water. Follow the directions on the prescription label. Do not crush or chew. Take this medicine with or immediately after meals. Take your doses at regular intervals. Do not take more medicine than directed. Do not stop taking this medicine suddenly. This could lead to serious heart-related effects. Talk to your pediatrician regarding the use of this medicine in children. While this drug may be prescribed for children as young as 6 years for selected conditions, precautions do apply. Overdosage: If you think you have taken too much of  this medicine contact a poison control center or emergency room at once. NOTE: This medicine is only for you. Do not share this medicine with others. What if I miss a dose? If you miss a dose, take it as soon as you can. If it is almost time for your next dose, take only that dose. Do not take double or extra doses. What may  interact with this medicine? This medicine may interact with the following medications:  certain medicines for blood pressure, heart disease, irregular heart beat  certain medicines for depression, like monoamine oxidase (MAO) inhibitors, fluoxetine, or paroxetine  clonidine  dobutamine  epinephrine  isoproterenol  reserpine This list may not describe all possible interactions. Give your health care provider a list of all the medicines, herbs, non-prescription drugs, or dietary supplements you use. Also tell them if you smoke, drink alcohol, or use illegal drugs. Some items may interact with your medicine. What should I watch for while using this medicine? Visit your doctor or health care professional for regular check ups. Contact your doctor right away if your symptoms worsen. Check your blood pressure and pulse rate regularly. Ask your health care professional what your blood pressure and pulse rate should be, and when you should contact them. You may get drowsy or dizzy. Do not drive, use machinery, or do anything that needs mental alertness until you know how this medicine affects you. Do not sit or stand up quickly, especially if you are an older patient. This reduces the risk of dizzy or fainting spells. Contact your doctor if these symptoms continue. Alcohol may interfere with the effect of this medicine. Avoid alcoholic drinks. This medicine may increase blood sugar. Ask your healthcare provider if changes in diet or medicines are needed if you have diabetes. What side effects may I notice from receiving this medicine? Side effects that you should report to your doctor or health care professional as soon as possible:  allergic reactions like skin rash, itching or hives  cold or numb hands or feet  depression  difficulty breathing  faint  fever with sore throat  irregular heartbeat, chest pain  rapid weight gain   signs and symptoms of high blood sugar such as being  more thirsty or hungry or having to urinate more than normal. You may also feel very tired or have blurry vision.  swollen legs or ankles Side effects that usually do not require medical attention (report to your doctor or health care professional if they continue or are bothersome):  anxiety or nervousness  change in sex drive or performance  dry skin  headache  nightmares or trouble sleeping  short term memory loss  stomach upset or diarrhea This list may not describe all possible side effects. Call your doctor for medical advice about side effects. You may report side effects to FDA at 1-800-FDA-1088. Where should I keep my medicine? Keep out of the reach of children. Store at room temperature between 15 and 30 degrees C (59 and 86 degrees F). Throw away any unused medicine after the expiration date. NOTE: This sheet is a summary. It may not cover all possible information. If you have questions about this medicine, talk to your doctor, pharmacist, or health care provider.  2020 Elsevier/Gold Standard (2018-08-08 11:09:41)

## 2019-06-08 ENCOUNTER — Telehealth: Payer: Self-pay | Admitting: *Deleted

## 2019-06-08 DIAGNOSIS — N952 Postmenopausal atrophic vaginitis: Secondary | ICD-10-CM

## 2019-06-08 HISTORY — DX: Postmenopausal atrophic vaginitis: N95.2

## 2019-06-08 NOTE — Telephone Encounter (Signed)
Left message to call back for CT appointment on Aug12,2020 at 4:30. Arrive at 4:00Pm at [facility].

## 2019-06-08 NOTE — Telephone Encounter (Signed)
Left detailed message on patient's home phone per [facility] with appointment details. Advised her to contact our office with additional questions or concerns.

## 2019-09-10 ENCOUNTER — Ambulatory Visit (INDEPENDENT_AMBULATORY_CARE_PROVIDER_SITE_OTHER): Payer: Medicare Other | Admitting: Cardiology

## 2019-09-10 ENCOUNTER — Other Ambulatory Visit: Payer: Self-pay

## 2019-09-10 ENCOUNTER — Encounter: Payer: Self-pay | Admitting: Cardiology

## 2019-09-10 VITALS — BP 140/78 | HR 52 | Ht <= 58 in | Wt 218.0 lb

## 2019-09-10 DIAGNOSIS — I2510 Atherosclerotic heart disease of native coronary artery without angina pectoris: Secondary | ICD-10-CM | POA: Diagnosis not present

## 2019-09-10 DIAGNOSIS — I712 Thoracic aortic aneurysm, without rupture, unspecified: Secondary | ICD-10-CM

## 2019-09-10 DIAGNOSIS — Z6841 Body Mass Index (BMI) 40.0 and over, adult: Secondary | ICD-10-CM

## 2019-09-10 DIAGNOSIS — I10 Essential (primary) hypertension: Secondary | ICD-10-CM | POA: Diagnosis not present

## 2019-09-10 DIAGNOSIS — I714 Abdominal aortic aneurysm, without rupture, unspecified: Secondary | ICD-10-CM

## 2019-09-10 DIAGNOSIS — E782 Mixed hyperlipidemia: Secondary | ICD-10-CM

## 2019-09-10 DIAGNOSIS — E785 Hyperlipidemia, unspecified: Secondary | ICD-10-CM

## 2019-09-10 NOTE — Patient Instructions (Signed)
Medication Instructions:  Your physician recommends that you continue on your current medications as directed. Please refer to the Current Medication list given to you today.  *If you need a refill on your cardiac medications before your next appointment, please call your pharmacy*  Lab Work: Your physician recommends that you return for lab work today: bmp 3-7 days before CT   If you have labs (blood work) drawn today and your tests are completely normal, you will receive your results only by: Marland Kitchen MyChart Message (if you have MyChart) OR . A paper copy in the mail If you have any lab test that is abnormal or we need to change your treatment, we will call you to review the results.  Testing/Procedures: Your physician has requested that you have an abdominal aorta duplex. During this test, an ultrasound is used to evaluate the aorta. Allow 30 minutes for this exam. Do not eat after midnight the day before and avoid carbonated beverages  Non-Cardiac CT scanning, (CAT scanning), is a noninvasive, special x-ray that produces cross-sectional images of the body using x-rays and a computer. CT scans help physicians diagnose and treat medical conditions. For some CT exams, a contrast material is used to enhance visibility in the area of the body being studied. CT scans provide greater clarity and reveal more details than regular x-ray exams.   Follow-Up: At [facility], you and your health needs are our priority.  As part of our continuing mission to provide you with exceptional heart care, we have created designated Provider Care Teams.  These Care Teams include your primary Cardiologist (physician) and Advanced Practice Providers (APPs -  Physician Assistants and Nurse Practitioners) who all work together to provide you with the care you need, when you need it.  Your next appointment:   6 months  The format for your next appointment:   In Person  Provider:   Jenne Campus, MD  Other  Instructions

## 2019-09-10 NOTE — Progress Notes (Signed)
Called patient informed her of CT appt. Advised to have labs 3-7 days before and no food for 4 hours before. Patient verbally understood.

## 2019-09-10 NOTE — Progress Notes (Signed)
Cardiology Office Note:    Date:  09/10/2019   ID:  Alyssa CasaLeatrice C Arpino, DOB 1951-10-05, MRN [id number]  PCP:  Gordan PaymentGrisso, Greg A., MD  Cardiologist:  Gypsy Balsamobert Anniemae Haberkorn, MD    Referring MD: Gordan PaymentGrisso, Greg A., MD   Chief Complaint  Patient presents with  . Follow-up  And doing great, I am still working  History of Present Illness:    Alyssa Rosales is a 68 y.o. 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  She was seen last time in August complaining of having some chest pain/abdominal pain however from the description look like it was GERD.  She was given proton pump inhibitor with excellent results she does not have any pain anymore.  She works a lot she works 14 days straight to 10 hours every single day.  Did not have any difficulty doing it.  She complained of having some hip problem and she inquired about potentially having hip surgery, however her orthopedic doctor who saw her told her she need to lose 50 pounds.  She is working on it but because some GI issues and chronic diarrhea she has some difficulty with it.  Past Medical History:  Diagnosis Date  . Abdominal aortic aneurysm (AAA) without rupture (HCC) 09/01/2017  . Coronary artery disease involving native coronary artery of native heart without angina pectoris 02/09/2017  . GERD without esophagitis 01/07/2016   Last Assessment & Plan:  Continue with the PPI and have refilled this for her  . Hyperlipidemia 01/27/2017   Overview:  Added automatically from request for surgery [id number]  . Hypertension   . Moderate persistent asthma without complication 08/10/2016  . OSA (obstructive sleep apnea) 09/30/2016   Follows with pulmonary is seen 12/19 for FU bipap 21/15    Past Surgical History:  Procedure Laterality Date  . CORONARY ANGIOPLASTY    . RECTAL SURGERY    . TOOTH EXTRACTION      Current Medications: Current Meds   Medication Sig  . APPLE CIDER VINEGAR PO Take 2 tablets by mouth daily.  Marland Kitchen. aspirin EC 81 MG tablet Take 2 tablets by mouth daily.  Marland Kitchen. atorvastatin (LIPITOR) 10 MG tablet Take 1 tablet (10 mg total) by mouth daily.  . budesonide-formoterol (SYMBICORT) 160-4.5 MCG/ACT inhaler Inhale 2 puffs into the lungs 2 (two) times daily.  . fluticasone (FLONASE) 50 MCG/ACT nasal spray Place 1 spray into the nose daily.  Marland Kitchen. HYDROcodone-acetaminophen (NORCO/VICODIN) 5-325 MG tablet Take 1-2 tablets by mouth every 6 (six) hours as needed.  Marland Kitchen. losartan (COZAAR) 50 MG tablet Take 2 tablets by mouth daily.  . metoprolol succinate (TOPROL XL) 25 MG 24 hr tablet Take 1 tablet (25 mg total) by mouth daily.  . nitroGLYCERIN (NITROSTAT) 0.4 MG SL tablet Place 1 tablet (0.4 mg total) under the tongue every 5 (five) minutes as needed for up to 10 doses for chest pain.  . pantoprazole (PROTONIX) 40 MG tablet Take 40 mg by mouth daily.  Marland Kitchen. torsemide (DEMADEX) 10 MG tablet Take 1 tablet by mouth 2 (two) times daily.  . Vitamin D, Ergocalciferol, (DRISDOL) 1.25 MG (50000 UT) CAPS capsule Take 1 capsule by mouth once a week.     Allergies:   Amoxicillin-pot clavulanate, Penicillins, Propoxyphene, and Sulfamethoxazole   Social History   Socioeconomic History  . Marital status: Divorced    Spouse name: Not on file  . Number of children: Not on  file  . Years of education: Not on file  . Highest education level: Not on file  Occupational History  . Not on file  Social Needs  . Financial resource strain: Not on file  . Food insecurity    Worry: Not on file    Inability: Not on file  . Transportation needs    Medical: Not on file    Non-medical: Not on file  Tobacco Use  . Smoking status: Never Smoker  . Smokeless tobacco: Never Used  Substance and Sexual Activity  . Alcohol use: Never    Frequency: Never  . Drug use: Never  . Sexual activity: Not on file  Lifestyle  . Physical activity    Days per week: Not on  file    Minutes per session: Not on file  . Stress: Not on file  Relationships  . Social Herbalist on phone: Not on file    Gets together: Not on file    Attends religious service: Not on file    Active member of club or organization: Not on file    Attends meetings of clubs or organizations: Not on file    Relationship status: Not on file  Other Topics Concern  . Not on file  Social History Narrative  . Not on file     Family History: The patient's family history includes Alzheimer's disease in her mother. There is no history of Breast cancer. ROS:   Please see the history of present illness.    All 14 point review of systems negative except as described per history of present illness  EKGs/Labs/Other Studies Reviewed:      Recent Labs: 06/07/2019: BUN 30; Creatinine, Ser 1.01; Potassium 3.5; Sodium 145  Recent Lipid Panel    Component Value Date/Time   CHOL 184 11/09/2018 1040   TRIG 138 11/09/2018 1040   HDL 45 11/09/2018 1040   CHOLHDL 4.1 11/09/2018 1040   LDLCALC 111 (H) 11/09/2018 1040    Physical Exam:    VS:  BP 140/78   Pulse (!) 52   Ht 4' 8.5" (1.435 m)   Wt 218 lb (98.9 kg)   SpO2 97%   BMI 48.01 kg/m     Wt Readings from Last 3 Encounters:  09/10/19 218 lb (98.9 kg)  06/07/19 217 lb 12.8 oz (98.8 kg)  04/09/19 220 lb (99.8 kg)     GEN:  Well nourished, well developed in no acute distress HEENT: Normal NECK: No JVD; No carotid bruits LYMPHATICS: No lymphadenopathy CARDIAC: RRR, no murmurs, no rubs, no gallops RESPIRATORY:  Clear to auscultation without rales, wheezing or rhonchi  ABDOMEN: Soft, non-tender, non-distended MUSCULOSKELETAL:  No edema; No deformity  SKIN: Warm and dry LOWER EXTREMITIES: no swelling NEUROLOGIC:  Alert and oriented x 3 PSYCHIATRIC:  Normal affect   ASSESSMENT:    1. Thoracic aortic aneurysm without rupture ([place])   2. Essential hypertension   3. Coronary artery disease involving native coronary  artery of native heart without angina pectoris   4. Abdominal aortic aneurysm (AAA) without rupture ([place])   5. Morbid obesity with BMI of 45.0-49.9, adult ([place])   6. Mixed hyperlipidemia   7. Dyslipidemia    PLAN:    In order of problems listed above:  1. Thoracic aortic aneurysm.  We will schedule her to have CT angio of her chest in January that will be year anniversary of last 1. 2. Essential hypertension when she check her blood pressure at home  usually good.  Today 140/78.  We will keep present management. 3. Coronary disease stable without any symptoms luminal disease by cardiac cath years ago 4. Abdominal arctic aneurysm we will schedule her to have abdominal aortic ultrasound to check on that. 5. Morbid obesity a problem she is working on it since she wants to have a hip replacement. 6. Mixed dyslipidemia last fasting lipid profile I see done by primary care physician show LDL 115 which is improved when compared 137 she is working with diet and meds.  Her HDL is 45.  This is in summer of this year.  We will make arrangements for her to have cholesterol done  Overall she is doing well likely she is very energetic she said she would like to work until she is 47.   Medication Adjustments/Labs and Tests Ordered: Current medicines are reviewed at length with the patient today.  Concerns regarding medicines are outlined above.  No orders of the defined types were placed in this encounter.  Medication changes: No orders of the defined types were placed in this encounter.   Signed, Georgeanna Lea, MD, [facility] 09/10/2019 8:55 AM    [facility]

## 2019-10-29 ENCOUNTER — Ambulatory Visit (INDEPENDENT_AMBULATORY_CARE_PROVIDER_SITE_OTHER): Payer: Medicare Other

## 2019-10-29 ENCOUNTER — Other Ambulatory Visit: Payer: Self-pay

## 2019-10-29 DIAGNOSIS — I714 Abdominal aortic aneurysm, without rupture: Secondary | ICD-10-CM | POA: Diagnosis not present

## 2019-10-29 NOTE — Progress Notes (Signed)
Abdominal aortic duplex exam has been performed.  Jimmy Delonna Ney RDCS, RVT 

## 2019-10-31 ENCOUNTER — Telehealth: Payer: Self-pay | Admitting: Cardiology

## 2019-10-31 NOTE — Telephone Encounter (Signed)
New Message ° ° °Patient is returning call in reference to doppler study results. Please call.  °

## 2019-10-31 NOTE — Telephone Encounter (Signed)
Telephone call to patient. Informed of AAA dopplar results

## 2019-11-08 ENCOUNTER — Telehealth: Payer: Self-pay

## 2019-11-13 LAB — BASIC METABOLIC PANEL
BUN/Creatinine Ratio: 46 — ABNORMAL HIGH (ref 12–28)
BUN: 37 mg/dL — ABNORMAL HIGH (ref 8–27)
CO2: 20 mmol/L (ref 20–29)
Calcium: 9.8 mg/dL (ref 8.7–10.3)
Chloride: 109 mmol/L — ABNORMAL HIGH (ref 96–106)
Creatinine, Ser: 0.81 mg/dL (ref 0.57–1.00)
GFR calc Af Amer: 85 mL/min/{1.73_m2} (ref >59)
GFR calc non Af Amer: 74 mL/min/{1.73_m2} (ref >59)
Glucose: 99 mg/dL (ref 65–99)
Potassium: 4 mmol/L (ref 3.5–5.2)
Sodium: 143 mmol/L (ref 134–144)

## 2019-11-13 NOTE — Progress Notes (Signed)
Multiple attempts made to contact patient regarding results. Letter mailed. Closing encounter.  

## 2019-11-13 NOTE — Telephone Encounter (Signed)
Patient called back, please call.

## 2019-11-16 ENCOUNTER — Encounter (HOSPITAL_BASED_OUTPATIENT_CLINIC_OR_DEPARTMENT_OTHER): Payer: Self-pay

## 2019-11-16 ENCOUNTER — Other Ambulatory Visit: Payer: Self-pay

## 2019-11-16 ENCOUNTER — Ambulatory Visit (HOSPITAL_BASED_OUTPATIENT_CLINIC_OR_DEPARTMENT_OTHER)
Admission: RE | Admit: 2019-11-16 | Discharge: 2019-11-16 | Disposition: A | Discharge status: Home / Self Care | Payer: Medicare Other | Source: Ambulatory Visit | Attending: Cardiology | Admitting: Cardiology

## 2019-11-16 DIAGNOSIS — I712 Thoracic aortic aneurysm, without rupture: Secondary | ICD-10-CM | POA: Insufficient documentation

## 2019-11-16 IMAGING — CT CT ANGIO CHEST
3 of 11 series · 17 of 46 positions shown · IV contrast (omnipaque)
Comparison: [DATE].

CLINICAL DATA: Thoracic aortic aneurysm.

EXAM:
CT ANGIOGRAPHY CHEST WITH CONTRAST
TECHNIQUE: Multidetector CT imaging of the chest was performed using the
standard protocol during bolus administration of intravenous
contrast. Multiplanar CT image reconstructions and MIPs were
obtained to evaluate the vascular anatomy.
CONTRAST:  100mL OMNIPAQUE IOHEXOL 350 MG/ML SOLN

[Series 6: axial arterial · axial · arterial · 0.76mm/px · z∈[-310,-97]mm · 11 of 87 slices shown]
[im 8/87  lung]
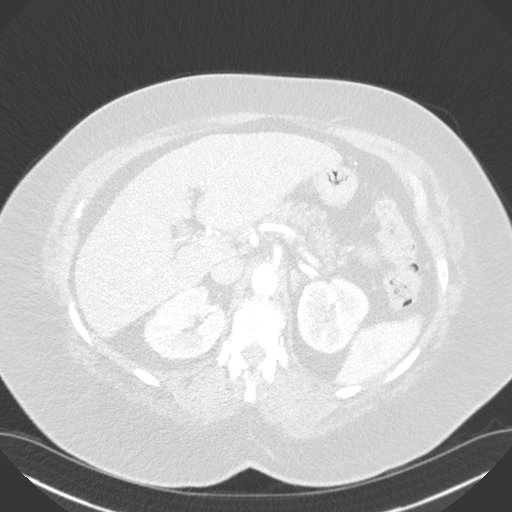
[im 15/87  soft-tissue]
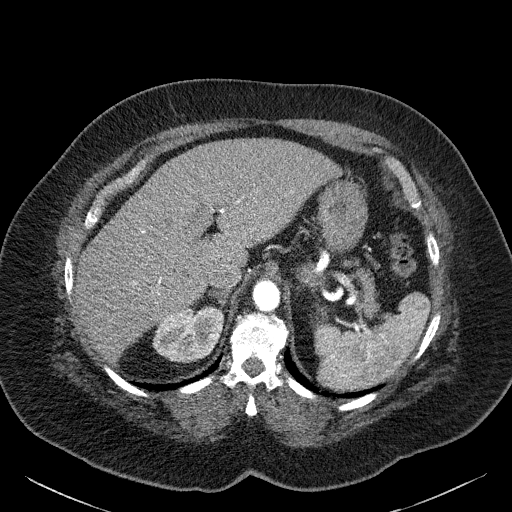
[im 22/87  lung]
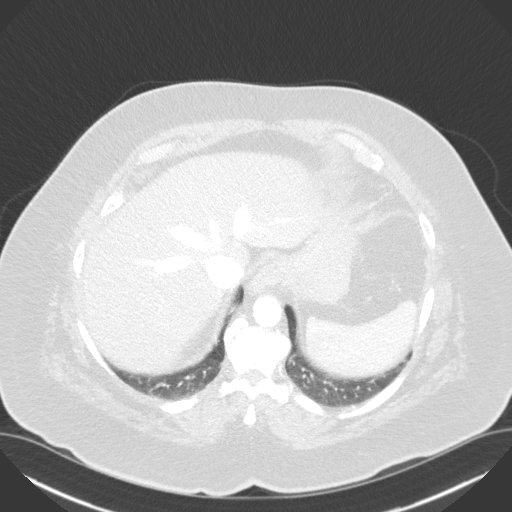
[im 29/87  soft-tissue]
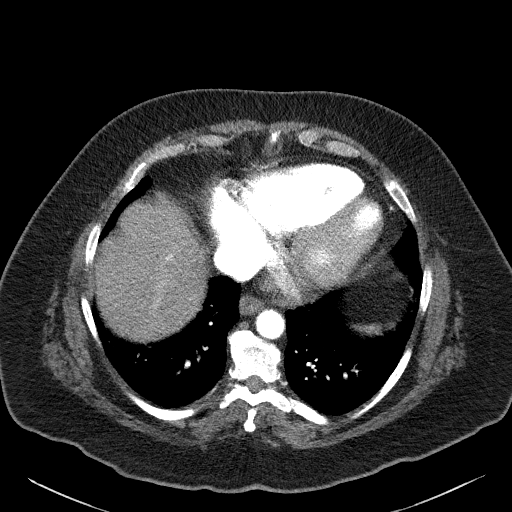
[im 36/87  lung]
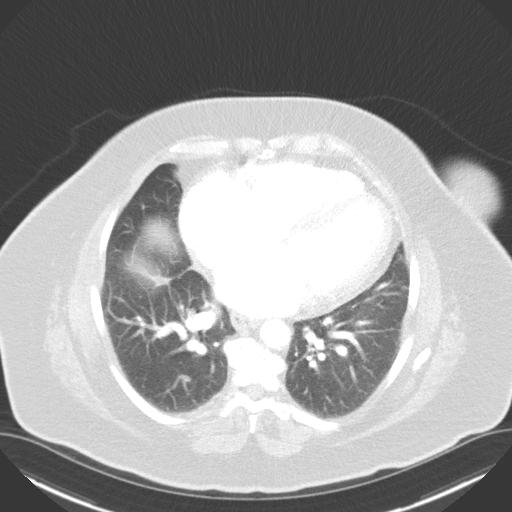
[im 44/87  soft-tissue]
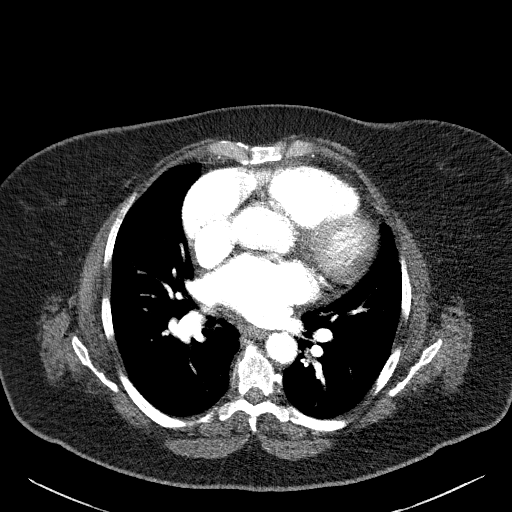
[im 51/87  lung]
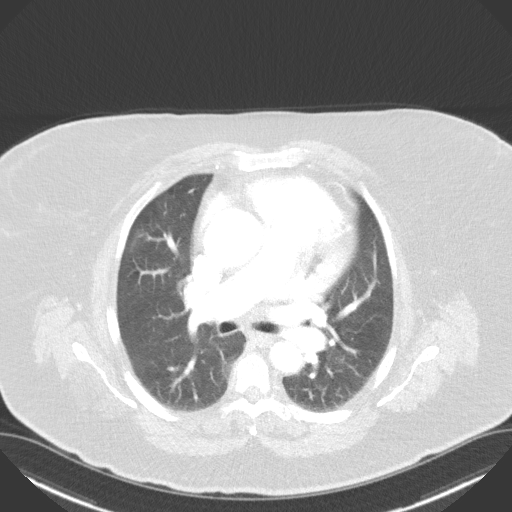
[im 58/87  soft-tissue]
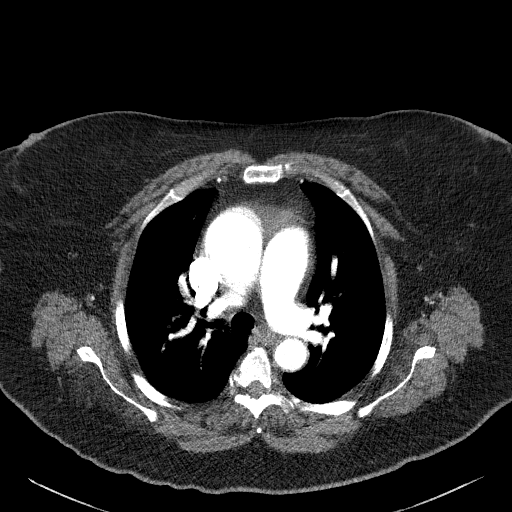
[im 65/87  lung]
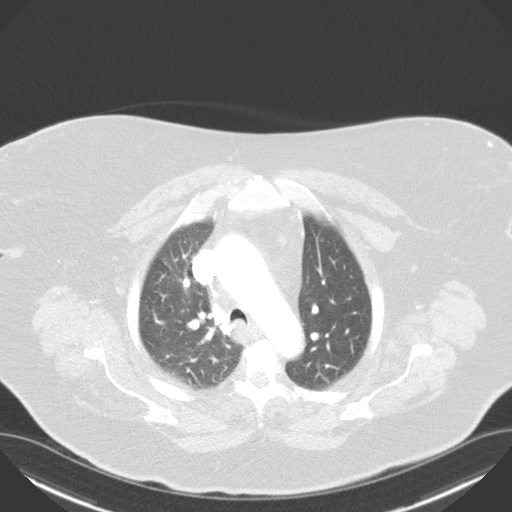
[im 72/87  soft-tissue]
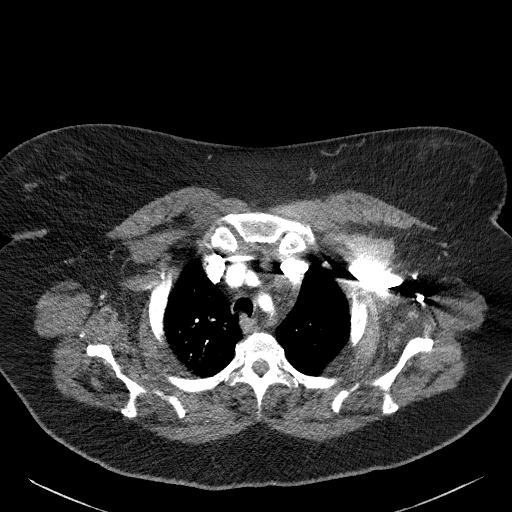
[im 79/87  lung]
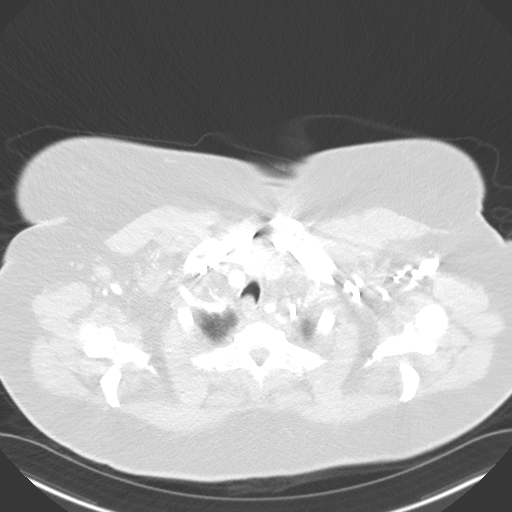

[Series 8: lung · axial · 0.98mm/px · z∈[-253,-183]mm · 3 of 44 slices shown]
[im 8/44  soft-tissue]
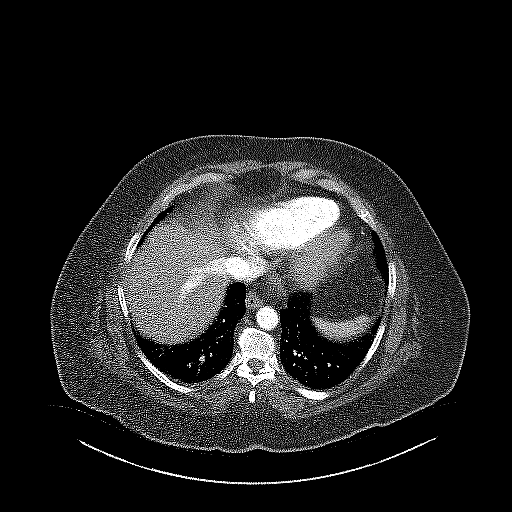
[im 15/44  soft-tissue]
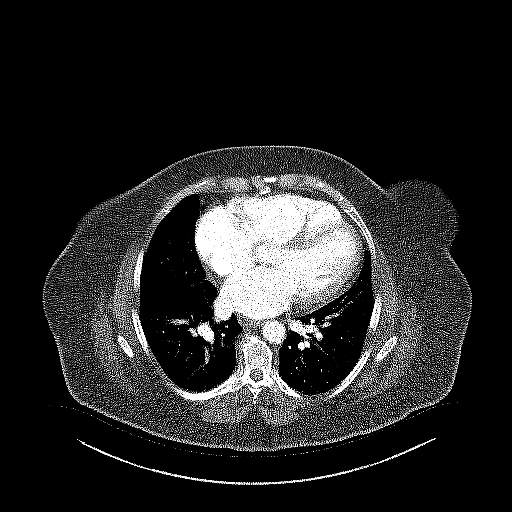
[im 22/44  soft-tissue]
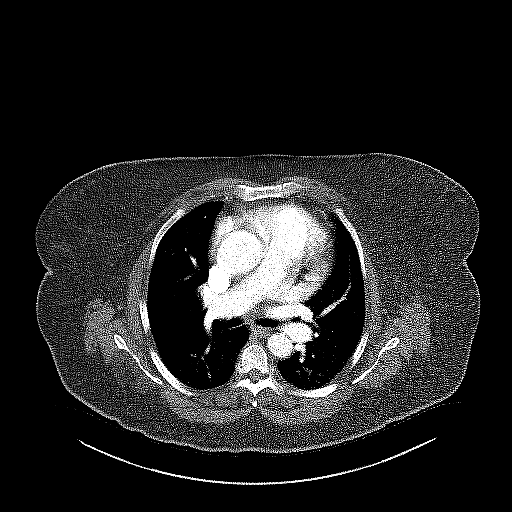

[Series 9: coronals · coronal · 0.51mm/px · 3 of 145 slices shown]
[im 37/145  soft-tissue]
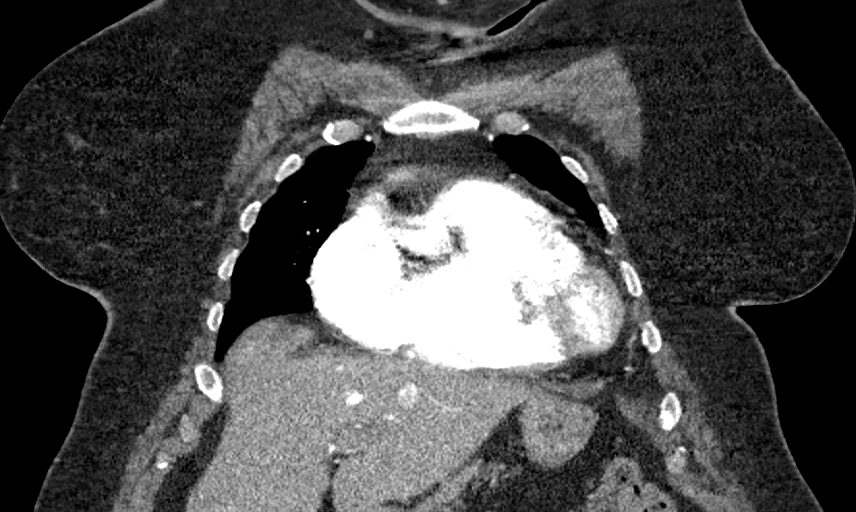
[im 73/145  soft-tissue]
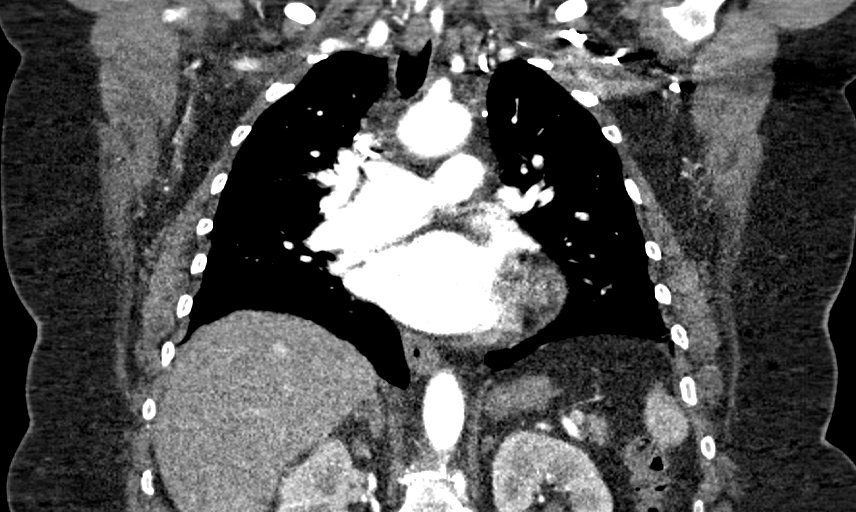
[im 109/145  soft-tissue]
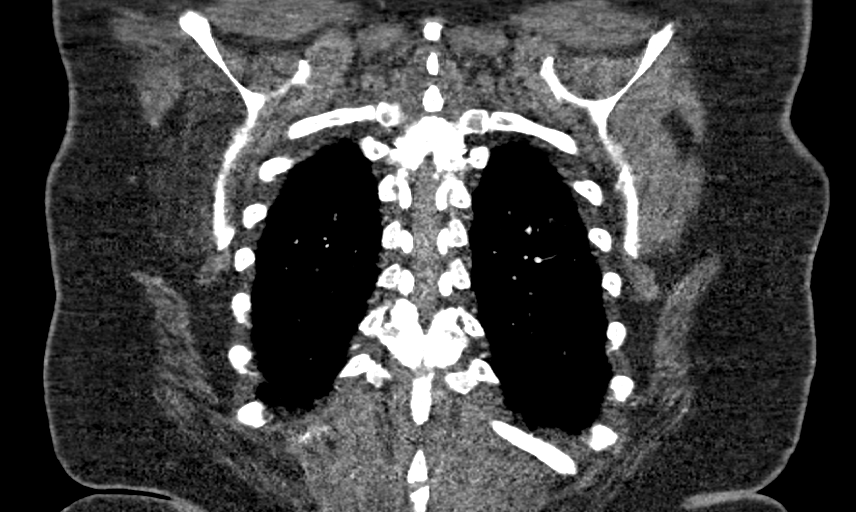

[17 of 46 positions shown; findings below may reference images not displayed]

FINDINGS: Cardiovascular: 4.5 cm ascending thoracic aortic aneurysm is noted
which is slightly enlarged compared to prior exam. Transverse aortic
arch measures 3.2 cm. Proximal descending thoracic aorta measures
2.5 cm. Mild cardiomegaly. No pericardial effusion.

Mediastinum/Nodes: No enlarged mediastinal, hilar, or axillary lymph
nodes. Thyroid gland, trachea, and esophagus demonstrate no
significant findings.

Lungs/Pleura: Lungs are clear. No pleural effusion or pneumothorax.

Upper Abdomen: No acute abnormality.

Musculoskeletal: No chest wall abnormality. No acute or significant
osseous findings.

Review of the MIP images confirms the above findings.
IMPRESSION: 4.5 cm ascending thoracic aortic aneurysm is noted which is slightly
enlarged compared to prior exam. Recommend semi-annual imaging
followup by CTA or MRA and referral to cardiothoracic surgery if not
already obtained. This recommendation follows [I5]
ACCF/AHA/AATS/ACR/ASA/SCA/JNR GH/JNR GH/JNR GH/JNR GH Guidelines for the
Diagnosis and Management of Patients With Thoracic Aortic Disease.
Circulation. [I5]; 121: [id number]. Aortic aneurysm NOS
([I5]-[I5]).

## 2019-11-16 MED ORDER — IOHEXOL 350 MG/ML SOLN
100.0000 mL | Freq: Once | INTRAVENOUS | Status: AC | PRN
  Administered 2019-11-16: 100 mL via INTRAVENOUS

## 2019-11-27 ENCOUNTER — Other Ambulatory Visit: Payer: Self-pay

## 2019-11-27 ENCOUNTER — Other Ambulatory Visit (INDEPENDENT_AMBULATORY_CARE_PROVIDER_SITE_OTHER): Payer: Self-pay | Admitting: Family Medicine

## 2019-11-27 ENCOUNTER — Ambulatory Visit (INDEPENDENT_AMBULATORY_CARE_PROVIDER_SITE_OTHER): Payer: Medicare Other | Admitting: Family Medicine

## 2019-11-27 ENCOUNTER — Encounter (INDEPENDENT_AMBULATORY_CARE_PROVIDER_SITE_OTHER): Payer: Self-pay | Admitting: Family Medicine

## 2019-11-27 VITALS — BP 138/81 | HR 60 | Temp 98.0°F | Ht <= 58 in | Wt 204.0 lb

## 2019-11-27 DIAGNOSIS — I10 Essential (primary) hypertension: Secondary | ICD-10-CM

## 2019-11-27 DIAGNOSIS — Z0289 Encounter for other administrative examinations: Secondary | ICD-10-CM

## 2019-11-27 DIAGNOSIS — K219 Gastro-esophageal reflux disease without esophagitis: Secondary | ICD-10-CM

## 2019-11-27 DIAGNOSIS — M25552 Pain in left hip: Secondary | ICD-10-CM

## 2019-11-27 DIAGNOSIS — R0602 Shortness of breath: Secondary | ICD-10-CM

## 2019-11-27 DIAGNOSIS — R5383 Other fatigue: Secondary | ICD-10-CM | POA: Diagnosis not present

## 2019-11-27 DIAGNOSIS — E559 Vitamin D deficiency, unspecified: Secondary | ICD-10-CM

## 2019-11-27 DIAGNOSIS — G4733 Obstructive sleep apnea (adult) (pediatric): Secondary | ICD-10-CM

## 2019-11-27 DIAGNOSIS — E538 Deficiency of other specified B group vitamins: Secondary | ICD-10-CM

## 2019-11-27 DIAGNOSIS — Z1331 Encounter for screening for depression: Secondary | ICD-10-CM

## 2019-11-27 DIAGNOSIS — R739 Hyperglycemia, unspecified: Secondary | ICD-10-CM

## 2019-11-27 DIAGNOSIS — Z6841 Body Mass Index (BMI) 40.0 and over, adult: Secondary | ICD-10-CM

## 2019-11-27 DIAGNOSIS — E7849 Other hyperlipidemia: Secondary | ICD-10-CM

## 2019-11-27 NOTE — Progress Notes (Signed)
Dear Dr. Berenice Primas,   Thank you for referring Alyssa Rosales to our clinic. The following note includes my evaluation and treatment recommendations.  Chief Complaint:   OBESITY Alyssa Rosales (MR# [id number]) is a 69 y.o. female who presents for evaluation and treatment of obesity and related comorbidities. Current BMI is Body mass index is 44.15 kg/m. Alyssa Rosales has been struggling with her weight for many years and has been unsuccessful in either losing weight, maintaining weight loss, or reaching her healthy weight goal.  Alyssa Rosales is currently in the action stage of change and ready to dedicate time achieving and maintaining a healthier weight. Alyssa Rosales is interested in becoming our patient and working on intensive lifestyle modifications including (but not limited to) diet and exercise for weight loss.  Alyssa Rosales's habits were reviewed today and are as follows: Her family eats meals together, her desired weight loss is 46 pounds, she has been heavy most of her life, she started gaining weight in her 3s, her heaviest weight ever was 260 pounds, she is a picky eater, she craves Frosties, and she frequently makes poor food choices.  Depression Screen Alyssa Rosales's Food and Mood (modified PHQ-9) score was 4.  Depression screen PHQ 2/9 11/27/2019  Decreased Interest 1  Down, Depressed, Hopeless 1  PHQ - 2 Score 2  Altered sleeping 0  Tired, decreased energy 1  Change in appetite 0  Feeling bad or failure about yourself  0  Trouble concentrating 0  Moving slowly or fidgety/restless 1  Suicidal thoughts 0  PHQ-9 Score 4  Difficult doing work/chores Somewhat difficult   Subjective:   1. Other fatigue Alyssa Rosales admits to daytime somnolence and denies waking up still tired. Patent has a history of symptoms of OSA. Alyssa Rosales generally gets 8 hours of sleep per night, and states that she has generally restful sleep. Snoring is not present. Apneic episodes are not present. Epworth  Sleepiness Score is 8.  2. SOB (shortness of breath) on exertion Alyssa Rosales notes increasing shortness of breath with exercising and seems to be worsening over time with weight gain. She notes getting out of breath sooner with activity than she used to. This has not gotten worse recently. Emer denies shortness of breath at rest or orthopnea.  3. OSA (obstructive sleep apnea) Alyssa Rosales has a diagnosis of sleep apnea. she reports that she is is using a CPAP regularly.   4. Essential hypertension Review: taking medications as instructed, no medication side effects noted, no chest pain on exertion, no dyspnea on exertion, no swelling of ankles.   BP Readings from Last 3 Encounters:  11/27/19 138/81  09/10/19 140/78  06/07/19 138/80   5. Other hyperlipidemia Alyssa Rosales has hyperlipidemia and has been trying to improve her cholesterol levels with intensive lifestyle modification including a low saturated fat diet, exercise and weight loss. She denies any chest pain, claudication or myalgias.  Lab Results  Component Value Date   CHOL 184 11/09/2018   HDL 45 11/09/2018   LDLCALC 111 (H) 11/09/2018   TRIG 138 11/09/2018   CHOLHDL 4.1 11/09/2018   6. Gastroesophageal reflux disease without esophagitis She has been experiencing acid reflux.  She takes Protonix 40 mg daily.  7. Left hip pain Alyssa Rosales has a history of left hip pain. She is followed by Orthopedics and needs a hip replacement. She was told to lose 50 pounds in order to qualify for surgery, per patient.   8. Vitamin D deficiency She is currently taking vit D. She denies  nausea, vomiting or muscle weakness.  9. Vitamin B12 deficiency She is not a vegetarian.  She does not have a previous diagnosis of pernicious anemia.  She does not have a history of weight loss surgery.   10. Depression screening Alyssa Rosales was screened for depression as part of her new patient visit.  Assessment/Plan:   1. Other fatigue Alyssa Rosales does feel  that her weight is causing her energy to be lower than it should be. Fatigue may be related to obesity, depression or many other causes. Labs will be ordered, and in the meanwhile, Alyssa Rosales will focus on self care including making healthy food choices, increasing physical activity and focusing on stress reduction.  - EKG 12-Lead  2. SOB (shortness of breath) on exertion Alyssa Rosales does not feel that she gets out of breath more easily that she used to when she exercises. Alyssa Rosales's shortness of breath appears to be obesity related and exercise induced. She has agreed to work on weight loss and gradually increase exercise to treat her exercise induced shortness of breath. Will continue to monitor closely.  3. OSA (obstructive sleep apnea) Intensive lifestyle modifications are the first line treatment for this issue. We discussed several lifestyle modifications today and she will continue to work on diet, exercise and weight loss efforts. We will continue to monitor. Orders and follow up as documented in patient record.   Counseling  Sleep apnea is a condition in which breathing pauses or becomes shallow during sleep. This happens over and over during the night. This disrupts your sleep and keeps your body from getting the rest that it needs, which can cause tiredness and lack of energy (fatigue) during the day.  Sleep apnea treatment: If you were given a device to open your airway while you sleep, USE IT!  Sleep hygiene:   Limit or avoid alcohol, caffeinated beverages, and cigarettes, especially close to bedtime.   Do not eat a large meal or eat spicy foods right before bedtime. This can lead to digestive discomfort that can make it hard for you to sleep.  Keep a sleep diary to help you and your health care provider figure out what could be causing your insomnia.  . Make your bedroom a dark, comfortable place where it is easy to fall asleep. ? Put up shades or blackout curtains to block light  from outside. ? Use a white noise machine to block noise. ? Keep the temperature cool. . Limit screen use before bedtime. This includes: ? Watching TV. ? Using your smartphone, tablet, or computer. . Stick to a routine that includes going to bed and waking up at the same times every day and night. This can help you fall asleep faster. Consider making a quiet activity, such as reading, part of your nighttime routine. . Try to avoid taking naps during the day so that you sleep better at night. . Get out of bed if you are still awake after 15 minutes of trying to sleep. Keep the lights down, but try reading or doing a quiet activity. When you feel sleepy, go back to bed.  4. Essential hypertension Alyssa Rosales is working on healthy weight loss and exercise to improve blood pressure control. We will watch for signs of hypotension as she continues her lifestyle modifications.  5. Other hyperlipidemia Cardiovascular risk and specific lipid/LDL goals reviewed.  We discussed several lifestyle modifications today and Alyssa Rosales will continue to work on diet, exercise and weight loss efforts. Orders and follow up as documented in patient  record.   Counseling Intensive lifestyle modifications are the first line treatment for this issue. . Dietary changes: Increase soluble fiber. Decrease simple carbohydrates. . Exercise changes: Moderate to vigorous-intensity aerobic activity 150 minutes per week if tolerated. . Lipid-lowering medications: see documented in medical record.  6. Gastroesophageal reflux disease without esophagitis Intensive lifestyle modifications are the first line treatment for this issue. We discussed several lifestyle modifications today and she will continue to work on diet, exercise and weight loss efforts. Orders and follow up as documented in patient record.   Counseling . If a person has gastroesophageal reflux disease (GERD), food and stomach acid move back up into the esophagus and  cause symptoms or problems such as damage to the esophagus. . Anti-reflux measures include: raising the head of the bed, avoiding tight clothing or belts, avoiding eating late at night, not lying down shortly after mealtime, and achieving weight loss. . Avoid ASA, NSAID's, caffeine, alcohol, and tobacco.  . OTC Pepcid and/or Tums are often very helpful for as needed use.  Alyssa Rosales Kitchen However, for persisting chronic or daily symptoms, stronger medications like Omeprazole may be needed. . You may need to avoid foods and drinks such as: ? Coffee and tea (with or without caffeine). ? Drinks that contain alcohol. ? Energy drinks and sports drinks. ? Bubbly (carbonated) drinks or sodas. ? Chocolate and cocoa. ? Peppermint and mint flavorings. ? Garlic and onions. ? Horseradish. ? Spicy and acidic foods. These include peppers, chili powder, curry powder, vinegar, hot sauces, and BBQ sauce. ? Citrus fruit juices and citrus fruits, such as oranges, lemons, and limes. ? Tomato-based foods. These include red sauce, chili, salsa, and pizza with red sauce. ? Fried and fatty foods. These include donuts, french fries, potato chips, and high-fat dressings. ? High-fat meats. These include hot dogs, rib eye steak, sausage, ham, and bacon.  7. Left hip pain We will work on weight loss and medically/nutritionally optimize patient for surgery. Will need clarification re: weight loss goal as 50 pounds is likely excessive.   8. Vitamin D deficiency Low Vitamin D level contributes to fatigue and are associated with obesity, breast, and colon cancer. She agrees to continue to take prescription Vitamin D @50 ,000 IU every week and will follow-up for routine testing of Vitamin D, at least 2-3 times per year to avoid over-replacement.  9. Vitamin B12 deficiency The diagnosis was reviewed with the patient. Counseling provided today, see below. We will continue to monitor. Orders and follow up as documented in patient  record.  Counseling . The body needs vitamin B12: to make red blood cells; to make DNA; and to help the nerves work properly so they can carry messages from the brain to the body.  . The main causes of vitamin B12 deficiency include dietary deficiency, digestive diseases, pernicious anemia, and having a surgery in which part of the stomach or small intestine is removed.  . Certain medicines can make it harder for the body to absorb vitamin B12. These medicines include: heartburn medications; some antibiotics; some medications used to treat diabetes, gout, and high cholesterol.  . In some cases, there are no symptoms of this condition. If the condition leads to anemia or nerve damage, various symptoms can occur, such as weakness or fatigue, shortness of breath, and numbness or tingling in your hands and feet.   . Treatment:  o May include taking vitamin B12 supplements.  o Avoid alcohol.  o Eat lots of healthy foods that contain vitamin B12: -  Beef, pork, chicken, [place], and organ meats, such as liver.  - Seafood: This includes clams, rainbow trout, salmon, tuna, and haddock. Eggs.  - Cereal and dairy products that are fortified: This means that vitamin B12 has been added to the food.   10. Depression screening Depression screen was negative today.  Her PHQ-9 was 4.  11. Class 3 severe obesity with serious comorbidity and body mass index (BMI) of 40.0 to 44.9 in adult, unspecified obesity type (HCC) Alyssa Rosales is currently in the action stage of change and her goal is to continue with weight loss efforts. I recommend Alyssa Rosales begin the structured treatment plan as follows:  She has agreed to the Category 1 Plan.  Exercise goals: Older adults should follow the adult guidelines. When older adults cannot meet the adult guidelines, they should be as physically active as their abilities and conditions will allow.  Older adults should do exercises that maintain or improve balance if they are at risk  of falling.    Behavioral modification strategies: increasing lean protein intake, decreasing simple carbohydrates, increasing vegetables and increasing water intake.  She was informed of the importance of frequent follow-up visits to maximize her success with intensive lifestyle modifications for her multiple health conditions. She was informed we would discuss her lab results at her next visit unless there is a critical issue that needs to be addressed sooner. Alyssa Rosales agreed to keep her next visit at the agreed upon time to discuss these results.  Objective:   Blood pressure 138/81, pulse 60, temperature 98 F (36.7 C), temperature source Oral, height 4\' 9"  (1.448 m), weight 204 lb (92.5 kg), SpO2 98 %. Body mass index is 44.15 kg/m.  EKG: Normal sinus rhythm, rate 55 bpm.  Indirect Calorimeter completed today shows a VO2 of 138 and a REE of 962.  Her calculated basal metabolic rate is [id number] thus her basal metabolic rate is worse than expected.  General: Cooperative, alert, well developed, in no acute distress. HEENT: Conjunctivae and lids unremarkable. Cardiovascular: Regular rhythm.  Lungs: Normal work of breathing. Neurologic: No focal deficits.   Lab Results  Component Value Date   CREATININE 0.81 11/12/2019   BUN 37 (H) 11/12/2019   NA 143 11/12/2019   K 4.0 11/12/2019   CL 109 (H) 11/12/2019   CO2 20 11/12/2019   Lab Results  Component Value Date   CHOL 184 11/09/2018   HDL 45 11/09/2018   LDLCALC 111 (H) 11/09/2018   TRIG 138 11/09/2018   CHOLHDL 4.1 11/09/2018   Attestation Statements:   This is the patient's first visit at [facility]. The patient's NEW PATIENT PACKET was reviewed at length. Included in the packet: current and past health history, medications, allergies, ROS, gynecologic history (women only), surgical history, family history, social history, weight history, weight loss surgery history (for those that have had weight loss surgery),  nutritional evaluation, mood and food questionnaire, PHQ9, Epworth questionnaire, sleep habits questionnaire, patient life and health improvement goals questionnaire. These will all be scanned into the patient's chart under media.   During the visit, I independently reviewed the patient's EKG, bioimpedance scale results, and indirect calorimeter results. I used this information to tailor a meal plan for the patient that will help her to lose weight and will improve her obesity-related conditions going forward. I performed a medically necessary appropriate examination and/or evaluation. I discussed the assessment and treatment plan with the patient. The patient was provided an opportunity to ask questions and all  were answered. The patient agreed with the plan and demonstrated an understanding of the instructions. Labs were ordered at this visit and will be reviewed at the next visit unless more critical results need to be addressed immediately. Clinical information was updated and documented in the EMR.   Time spent on visit including pre-visit chart review and post-visit care was 60 minutes.   I, Insurance claims handler, CMA, am acting as Energy manager for [employer], DO.  I have reviewed the above documentation for accuracy and completeness, and I agree with the above. Helane Rima, DO

## 2019-11-28 ENCOUNTER — Other Ambulatory Visit: Payer: Self-pay | Admitting: Internal Medicine

## 2019-11-28 DIAGNOSIS — Z1231 Encounter for screening mammogram for malignant neoplasm of breast: Secondary | ICD-10-CM

## 2019-11-28 LAB — ANEMIA PANEL
Ferritin: 120 ng/mL (ref 15–150)
Folate, Hemolysate: 379 ng/mL
Folate, RBC: 831 ng/mL (ref >498)
Hematocrit: 45.6 % (ref 34.0–46.6)
Iron Saturation: 17 % (ref 15–55)
Iron: 54 ug/dL (ref 27–139)
Retic Ct Pct: 1.5 % (ref 0.6–2.6)
Total Iron Binding Capacity: 309 ug/dL (ref 250–450)
UIBC: 255 ug/dL (ref 118–369)
Vitamin B-12: 1668 pg/mL — ABNORMAL HIGH (ref 232–1245)

## 2019-12-02 LAB — COMPREHENSIVE METABOLIC PANEL
ALT: 10 IU/L (ref 0–32)
AST: 19 IU/L (ref 0–40)
Albumin/Globulin Ratio: 1.6 (ref 1.2–2.2)
Albumin: 4.4 g/dL (ref 3.8–4.8)
Alkaline Phosphatase: 86 IU/L (ref 39–117)
BUN/Creatinine Ratio: 36 — ABNORMAL HIGH (ref 12–28)
BUN: 30 mg/dL — ABNORMAL HIGH (ref 8–27)
Bilirubin Total: 0.3 mg/dL (ref 0.0–1.2)
CO2: 22 mmol/L (ref 20–29)
Calcium: 9.2 mg/dL (ref 8.7–10.3)
Chloride: 104 mmol/L (ref 96–106)
Creatinine, Ser: 0.84 mg/dL (ref 0.57–1.00)
GFR calc Af Amer: 81 mL/min/{1.73_m2} (ref >59)
GFR calc non Af Amer: 71 mL/min/{1.73_m2} (ref >59)
Globulin, Total: 2.7 g/dL (ref 1.5–4.5)
Glucose: 85 mg/dL (ref 65–99)
Potassium: 3.7 mmol/L (ref 3.5–5.2)
Sodium: 144 mmol/L (ref 134–144)
Total Protein: 7.1 g/dL (ref 6.0–8.5)

## 2019-12-02 LAB — CBC WITH DIFFERENTIAL/PLATELET
Basophils Absolute: 0 10*3/uL (ref 0.0–0.2)
Basos: 0 %
EOS (ABSOLUTE): 0 10*3/uL (ref 0.0–0.4)
Eos: 1 %
Hematocrit: 43.9 % (ref 34.0–46.6)
Hemoglobin: 15.2 g/dL (ref 11.1–15.9)
Immature Grans (Abs): 0 10*3/uL (ref 0.0–0.1)
Immature Granulocytes: 0 %
Lymphocytes Absolute: 2 10*3/uL (ref 0.7–3.1)
Lymphs: 30 %
MCH: 30.8 pg (ref 26.6–33.0)
MCHC: 34.6 g/dL (ref 31.5–35.7)
MCV: 89 fL (ref 79–97)
Monocytes Absolute: 0.5 10*3/uL (ref 0.1–0.9)
Monocytes: 7 %
Neutrophils Absolute: 4 10*3/uL (ref 1.4–7.0)
Neutrophils: 62 %
Platelets: 210 10*3/uL (ref 150–450)
RBC: 4.94 x10E6/uL (ref 3.77–5.28)
RDW: 13.8 % (ref 11.7–15.4)
WBC: 6.5 10*3/uL (ref 3.4–10.8)

## 2019-12-02 LAB — T4, FREE: Free T4: 1.23 ng/dL (ref 0.82–1.77)

## 2019-12-02 LAB — LIPID PANEL WITH LDL/HDL RATIO
Cholesterol, Total: 206 mg/dL — ABNORMAL HIGH (ref 100–199)
HDL: 57 mg/dL (ref >39)
LDL Chol Calc (NIH): 133 mg/dL — ABNORMAL HIGH (ref 0–99)
LDL/HDL Ratio: 2.3 ratio (ref 0.0–3.2)
Triglycerides: 90 mg/dL (ref 0–149)
VLDL Cholesterol Cal: 16 mg/dL (ref 5–40)

## 2019-12-02 LAB — MAGNESIUM: Magnesium: 1.9 mg/dL (ref 1.6–2.3)

## 2019-12-02 LAB — HEMOGLOBIN A1C
Est. average glucose Bld gHb Est-mCnc: 120 mg/dL
Hgb A1c MFr Bld: 5.8 % — ABNORMAL HIGH (ref 4.8–5.6)

## 2019-12-02 LAB — T3: T3, Total: 78 ng/dL (ref 71–180)

## 2019-12-02 LAB — VITAMIN B1: Thiamine: 131.7 nmol/L (ref 66.5–200.0)

## 2019-12-02 LAB — VITAMIN D 25 HYDROXY (VIT D DEFICIENCY, FRACTURES): Vit D, 25-Hydroxy: 19.6 ng/mL — ABNORMAL LOW (ref 30.0–100.0)

## 2019-12-02 LAB — INSULIN, RANDOM: INSULIN: 9 u[IU]/mL (ref 2.6–24.9)

## 2019-12-02 LAB — TSH: TSH: 0.553 u[IU]/mL (ref 0.450–4.500)

## 2019-12-11 ENCOUNTER — Encounter (INDEPENDENT_AMBULATORY_CARE_PROVIDER_SITE_OTHER): Payer: Self-pay | Admitting: Family Medicine

## 2019-12-11 ENCOUNTER — Ambulatory Visit (INDEPENDENT_AMBULATORY_CARE_PROVIDER_SITE_OTHER): Payer: Medicare Other | Admitting: Family Medicine

## 2019-12-11 ENCOUNTER — Other Ambulatory Visit: Payer: Self-pay

## 2019-12-11 VITALS — BP 130/81 | HR 62 | Temp 97.7°F | Ht <= 58 in | Wt 204.0 lb

## 2019-12-11 DIAGNOSIS — G4733 Obstructive sleep apnea (adult) (pediatric): Secondary | ICD-10-CM

## 2019-12-11 DIAGNOSIS — Z6841 Body Mass Index (BMI) 40.0 and over, adult: Secondary | ICD-10-CM

## 2019-12-11 DIAGNOSIS — M25552 Pain in left hip: Secondary | ICD-10-CM

## 2019-12-11 DIAGNOSIS — R7303 Prediabetes: Secondary | ICD-10-CM | POA: Diagnosis not present

## 2019-12-11 DIAGNOSIS — Z9189 Other specified personal risk factors, not elsewhere classified: Secondary | ICD-10-CM

## 2019-12-11 DIAGNOSIS — Z9989 Dependence on other enabling machines and devices: Secondary | ICD-10-CM

## 2019-12-11 DIAGNOSIS — E7849 Other hyperlipidemia: Secondary | ICD-10-CM | POA: Diagnosis not present

## 2019-12-11 DIAGNOSIS — E559 Vitamin D deficiency, unspecified: Secondary | ICD-10-CM | POA: Diagnosis not present

## 2019-12-12 NOTE — Progress Notes (Signed)
Chief Complaint:   OBESITY Alyssa Rosales is here to discuss her progress with her obesity treatment plan along with follow-up of her obesity related diagnoses. Alyssa Rosales is on the Category 1 Plan and states she is following her eating plan approximately 80% of the time. Alyssa Rosales states she is exercising for 0 minutes 0 times per week.  Today's visit was #: 2 Starting weight: 204 lbs Starting date: 11/27/2019 Today's weight: 204 lbs Today's date: 12/11/2019 Total lbs lost to date: 0 Total lbs lost since last in-office visit: 0  Interim History: Alyssa Rosales is frustrated today.  She says her hip has been painful, so she did not sleep well.  She wants to go back to [facility], Orthopedist.  Her goal is a 20 pound weight loss to get her BMI to 40.  Subjective:   1. Vitamin D deficiency Alyssa Rosales's Vitamin D level was 19.6 on 11/27/2019. She is currently taking vit D. She denies nausea, vomiting or muscle weakness.  2. Prediabetes Alyssa Rosales has a diagnosis of prediabetes based on her elevated HgA1c and was informed this puts her at greater risk of developing diabetes. She continues to work on diet and exercise to decrease her risk of diabetes. She denies nausea or hypoglycemia.  Lab Results  Component Value Date   HGBA1C 5.8 (H) 11/27/2019   Lab Results  Component Value Date   INSULIN 9.0 11/27/2019   3. Other hyperlipidemia Alyssa Rosales has hyperlipidemia and has been trying to improve her cholesterol levels with intensive lifestyle modification including a low saturated fat diet, exercise and weight loss. She denies any chest pain, claudication or myalgias.  Lab Results  Component Value Date   ALT 10 11/27/2019   AST 19 11/27/2019   ALKPHOS 86 11/27/2019   BILITOT 0.3 11/27/2019   Lab Results  Component Value Date   CHOL 206 (H) 11/27/2019   HDL 57 11/27/2019   LDLCALC 133 (H) 11/27/2019   TRIG 90 11/27/2019   CHOLHDL 4.1 11/09/2018   4. OSA on CPAP Alyssa Rosales has a diagnosis of  sleep apnea. She reports that she is using a CPAP regularly.   5. Left hip pain Alyssa Rosales is followed by Orthopedics.  She is hoping for a replacement.  Her BMI needs to be less than 40, which would be a 20 pound weight loss.  6. At risk for diabetes mellitus Alyssa Rosales is at higher than average risk for developing diabetes due to her obesity.   Assessment/Plan:   1. Vitamin D deficiency Low Vitamin D level contributes to fatigue and are associated with obesity, breast, and colon cancer. She agrees to continue to take prescription Vitamin D @50 ,000 IU every week and will follow-up for routine testing of Vitamin D, at least 2-3 times per year to avoid over-replacement.  2. Prediabetes Alyssa Rosales will continue to work on weight loss, exercise, and decreasing simple carbohydrates to help decrease the risk of diabetes.   3. Other hyperlipidemia Cardiovascular risk and specific lipid/LDL goals reviewed.  We discussed several lifestyle modifications today and Alyssa Rosales will continue to work on diet, exercise and weight loss efforts. Orders and follow up as documented in patient record.   Counseling Intensive lifestyle modifications are the first line treatment for this issue. . Dietary changes: Increase soluble fiber. Decrease simple carbohydrates. . Exercise changes: Moderate to vigorous-intensity aerobic activity 150 minutes per week if tolerated. . Lipid-lowering medications: see documented in medical record.  4. OSA on CPAP Intensive lifestyle modifications are the first line treatment for this  issue. We discussed several lifestyle modifications today and she will continue to work on diet, exercise and weight loss efforts. We will continue to monitor. Orders and follow up as documented in patient record.   Counseling  Sleep apnea is a condition in which breathing pauses or becomes shallow during sleep. This happens over and over during the night. This disrupts your sleep and keeps your body  from getting the rest that it needs, which can cause tiredness and lack of energy (fatigue) during the day.  Sleep apnea treatment: If you were given a device to open your airway while you sleep, USE IT!  Sleep hygiene:   Limit or avoid alcohol, caffeinated beverages, and cigarettes, especially close to bedtime.   Do not eat a large meal or eat spicy foods right before bedtime. This can lead to digestive discomfort that can make it hard for you to sleep.  Keep a sleep diary to help you and your health care provider figure out what could be causing your insomnia.  . Make your bedroom a dark, comfortable place where it is easy to fall asleep. ? Put up shades or blackout curtains to block light from outside. ? Use a white noise machine to block noise. ? Keep the temperature cool. . Limit screen use before bedtime. This includes: ? Watching TV. ? Using your smartphone, tablet, or computer. . Stick to a routine that includes going to bed and waking up at the same times every day and night. This can help you fall asleep faster. Consider making a quiet activity, such as reading, part of your nighttime routine. . Try to avoid taking naps during the day so that you sleep better at night. . Get out of bed if you are still awake after 15 minutes of trying to sleep. Keep the lights down, but try reading or doing a quiet activity. When you feel sleepy, go back to bed.  5. Left hip pain Will follow because mobility and pain control are important for weight management.  6. At risk for diabetes mellitus Alyssa Rosales was given approximately 15 minutes of diabetes education and counseling today. We discussed intensive lifestyle modifications today with an emphasis on weight loss as well as increasing exercise and decreasing simple carbohydrates in her diet. We also reviewed medication options with an emphasis on risk versus benefit of those discussed.   Repetitive spaced learning was employed today to elicit  superior memory formation and behavioral change.  7. Class 3 severe obesity with serious comorbidity and body mass index (BMI) of 40.0 to 44.9 in adult, unspecified obesity type (Alyssa Rosales) Alyssa Rosales is currently in the action stage of change. As such, her goal is to continue with weight loss efforts. She has agreed to keeping a food journal and adhering to recommended goals of 1000 calories and 75+ grams of protein.   Exercise goals: As tolerated.  Behavioral modification strategies: increasing lean protein intake.  Alyssa Rosales has agreed to follow-up with our clinic in 3 weeks. She was informed of the importance of frequent follow-up visits to maximize her success with intensive lifestyle modifications for her multiple health conditions.   Objective:   Blood pressure 130/81, pulse 62, temperature 97.7 F (36.5 C), temperature source Oral, height 4\' 9"  (1.448 m), weight 204 lb (92.5 kg), SpO2 98 %. Body mass index is 44.15 kg/m.  General: Cooperative, alert, well developed, in no acute distress. HEENT: Conjunctivae and lids unremarkable. Cardiovascular: Regular rhythm.  Lungs: Normal work of breathing. Neurologic: No focal deficits.  Lab Results  Component Value Date   CREATININE 0.84 11/27/2019   BUN 30 (H) 11/27/2019   NA 144 11/27/2019   K 3.7 11/27/2019   CL 104 11/27/2019   CO2 22 11/27/2019   Lab Results  Component Value Date   ALT 10 11/27/2019   AST 19 11/27/2019   ALKPHOS 86 11/27/2019   BILITOT 0.3 11/27/2019   Lab Results  Component Value Date   HGBA1C 5.8 (H) 11/27/2019   Lab Results  Component Value Date   INSULIN 9.0 11/27/2019   Lab Results  Component Value Date   TSH 0.553 11/27/2019   Lab Results  Component Value Date   CHOL 206 (H) 11/27/2019   HDL 57 11/27/2019   LDLCALC 133 (H) 11/27/2019   TRIG 90 11/27/2019   CHOLHDL 4.1 11/09/2018   Lab Results  Component Value Date   WBC 6.5 11/27/2019   HGB 15.2 11/27/2019   HCT 43.9 11/27/2019   HCT  45.6 11/27/2019   MCV 89 11/27/2019   PLT 210 11/27/2019   Lab Results  Component Value Date   IRON 54 11/27/2019   TIBC 309 11/27/2019   FERRITIN 120 11/27/2019   Attestation Statements:   Reviewed by clinician on day of visit: allergies, medications, problem list, medical history, surgical history, family history, social history, and previous encounter notes.  I, Insurance claims handler, CMA, am acting as Energy manager for [employer], DO.  I have reviewed the above documentation for accuracy and completeness, and I agree with the above. Helane Rima, DO

## 2019-12-13 ENCOUNTER — Encounter (INDEPENDENT_AMBULATORY_CARE_PROVIDER_SITE_OTHER): Payer: Self-pay | Admitting: Family Medicine

## 2019-12-31 ENCOUNTER — Ambulatory Visit (INDEPENDENT_AMBULATORY_CARE_PROVIDER_SITE_OTHER): Payer: Medicare Other | Admitting: Family Medicine

## 2019-12-31 ENCOUNTER — Other Ambulatory Visit: Payer: Self-pay

## 2019-12-31 ENCOUNTER — Encounter (INDEPENDENT_AMBULATORY_CARE_PROVIDER_SITE_OTHER): Payer: Self-pay | Admitting: Family Medicine

## 2019-12-31 VITALS — BP 149/78 | HR 54 | Temp 97.9°F | Ht <= 58 in | Wt 207.0 lb

## 2019-12-31 DIAGNOSIS — Z6841 Body Mass Index (BMI) 40.0 and over, adult: Secondary | ICD-10-CM

## 2019-12-31 DIAGNOSIS — R7303 Prediabetes: Secondary | ICD-10-CM

## 2019-12-31 DIAGNOSIS — M1612 Unilateral primary osteoarthritis, left hip: Secondary | ICD-10-CM

## 2019-12-31 DIAGNOSIS — G4733 Obstructive sleep apnea (adult) (pediatric): Secondary | ICD-10-CM | POA: Diagnosis not present

## 2019-12-31 DIAGNOSIS — E559 Vitamin D deficiency, unspecified: Secondary | ICD-10-CM

## 2019-12-31 DIAGNOSIS — I10 Essential (primary) hypertension: Secondary | ICD-10-CM

## 2019-12-31 DIAGNOSIS — Z9989 Dependence on other enabling machines and devices: Secondary | ICD-10-CM

## 2019-12-31 NOTE — Progress Notes (Signed)
Chief Complaint:   OBESITY Alyssa Rosales is here to discuss her progress with her obesity treatment plan along with follow-up of her obesity related diagnoses. Alyssa Rosales is on practicing portion control and making smarter food choices, such as increasing vegetables and decreasing simple carbohydrates and states she is following her eating plan approximately 0% of the time. Alyssa Rosales states she is exercising for 0 minutes 0 times per week.  Today's visit was #: 3 Starting weight: 204 lbs Starting date: 11/27/2019 Today's weight: 207 lbs Today's date: 12/31/2019 Total lbs lost to date: 0 Total lbs lost since last in-office visit: 0  Interim History: Alyssa Rosales needs a 20 pound weight loss to get her BMI under 40.  She needs a hip replacement.  She has already lost 50 pounds through exercise, but she is not able to exercise now secondary to pain.  She is taking Motrin 800 mg twice daily.  She works 10-12 hour shifts 7 days a week sometimes.  Has history of congential gnt abormality.  Limited foods tolerated.  Subjective:   1. Prediabetes Alyssa Rosales has a diagnosis of prediabetes based on her elevated HgA1c and was informed this puts her at greater risk of developing diabetes. She continues to work on diet and exercise to decrease her risk of diabetes. She denies nausea or hypoglycemia.  Lab Results  Component Value Date   HGBA1C 5.8 (H) 11/27/2019   Lab Results  Component Value Date   INSULIN 9.0 11/27/2019   2. Vitamin D deficiency Alyssa Rosales's Vitamin D level was 19.6 on 11/27/2019. She is currently taking vit D. She denies nausea, vomiting or muscle weakness.  3. Osteoarthritis of left hip, unspecified osteoarthritis type Alyssa Rosales is followed by Orthopedics.  4. OSA on CPAP Alyssa Rosales has a diagnosis of sleep apnea. She reports that she is using a CPAP regularly.   5. Essential hypertension Review: taking medications as instructed, no medication side effects noted, no chest pain on  exertion, no dyspnea on exertion, no swelling of ankles. BP is elevated.  She is asymptomatic.  BP Readings from Last 3 Encounters:  12/31/19 (!) 149/78  12/11/19 130/81  11/27/19 138/81   Assessment/Plan:   1. Prediabetes Alyssa Rosales will continue to work on weight loss, exercise, and decreasing simple carbohydrates to help decrease the risk of diabetes.   2. Vitamin D deficiency Low Vitamin D level contributes to fatigue and are associated with obesity, breast, and colon cancer. She agrees to an over the counter supplement going forward.  3. Osteoarthritis of left hip, unspecified osteoarthritis type Will follow because mobility and pain control are important for weight management.  4. OSA on CPAP Intensive lifestyle modifications are the first line treatment for this issue. We discussed several lifestyle modifications today and she will continue to work on diet, exercise and weight loss efforts. We will continue to monitor. Orders and follow up as documented in patient record.   Counseling  Sleep apnea is a condition in which breathing pauses or becomes shallow during sleep. This happens over and over during the night. This disrupts your sleep and keeps your body from getting the rest that it needs, which can cause tiredness and lack of energy (fatigue) during the day.  Sleep apnea treatment: If you were given a device to open your airway while you sleep, USE IT!  Sleep hygiene:   Limit or avoid alcohol, caffeinated beverages, and cigarettes, especially close to bedtime.   Do not eat a large meal or eat spicy foods right before  bedtime. This can lead to digestive discomfort that can make it hard for you to sleep.  Keep a sleep diary to help you and your health care provider figure out what could be causing your insomnia.  . Make your bedroom a dark, comfortable place where it is easy to fall asleep. ? Put up shades or blackout curtains to block light from outside. ? Use a white  noise machine to block noise. ? Keep the temperature cool. . Limit screen use before bedtime. This includes: ? Watching TV. ? Using your smartphone, tablet, or computer. . Stick to a routine that includes going to bed and waking up at the same times every day and night. This can help you fall asleep faster. Consider making a quiet activity, such as reading, part of your nighttime routine. . Try to avoid taking naps during the day so that you sleep better at night. . Get out of bed if you are still awake after 15 minutes of trying to sleep. Keep the lights down, but try reading or doing a quiet activity. When you feel sleepy, go back to bed.  5. Essential hypertension Alyssa Rosales is working on healthy weight loss and exercise to improve blood pressure control. We will watch for signs of hypotension as she continues her lifestyle modifications.  6. 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 As such, her goal is to continue with weight loss efforts. She has agreed to practicing portion control and making smarter food choices, such as increasing vegetables and decreasing simple carbohydrates.   Exercise goals: Save aerobic exercises.  Behavioral modification strategies: decreasing simple carbohydrates and increasing water intake.  Alyssa Rosales has agreed to follow-up with our clinic prn. She was informed of the importance of frequent follow-up visits to maximize her success with intensive lifestyle modifications for her multiple health conditions.   Objective:   Blood pressure (!) 149/78, pulse (!) 54, temperature 97.9 F (36.6 C), temperature source Oral, height 4\' 9"  (1.448 m), weight 207 lb (93.9 kg), SpO2 96 %. Body mass index is 44.79 kg/m.  General: Cooperative, alert, well developed, in no acute distress. HEENT: Conjunctivae and lids  unremarkable. Cardiovascular: Regular rhythm.  Lungs: Normal work of breathing. Neurologic: No focal deficits.   Lab Results  Component Value Date   CREATININE 0.84 11/27/2019   BUN 30 (H) 11/27/2019   NA 144 11/27/2019   K 3.7 11/27/2019   CL 104 11/27/2019   CO2 22 11/27/2019   Lab Results  Component Value Date   ALT 10 11/27/2019   AST 19 11/27/2019   ALKPHOS 86 11/27/2019   BILITOT 0.3 11/27/2019   Lab Results  Component Value Date   HGBA1C 5.8 (H) 11/27/2019   Lab Results  Component Value Date   INSULIN 9.0 11/27/2019   Lab Results  Component Value Date   TSH 0.553 11/27/2019   Lab Results  Component Value Date   CHOL 206 (H) 11/27/2019   HDL 57 11/27/2019   LDLCALC 133 (H) 11/27/2019   TRIG 90 11/27/2019   CHOLHDL 4.1 11/09/2018   Lab Results  Component Value Date   WBC 6.5 11/27/2019   HGB 15.2 11/27/2019   HCT 43.9 11/27/2019   HCT 45.6 11/27/2019   MCV 89 11/27/2019   PLT 210 11/27/2019   Lab Results  Component Value Date   IRON 54 11/27/2019  TIBC 309 11/27/2019   FERRITIN 120 11/27/2019   Attestation Statements:   Reviewed by clinician on day of visit: allergies, medications, problem list, medical history, surgical history, family history, social history, and previous encounter notes.  I, Water quality scientist, CMA, am acting as Location manager for [employer], DO.  I have reviewed the above documentation for accuracy and completeness, and I agree with the above. Briscoe Deutscher, DO

## 2020-01-18 ENCOUNTER — Ambulatory Visit
Admission: RE | Admit: 2020-01-18 | Discharge: 2020-01-18 | Disposition: A | Discharge status: Home / Self Care | Payer: Medicare Other | Source: Ambulatory Visit | Attending: Internal Medicine | Admitting: Internal Medicine

## 2020-01-18 ENCOUNTER — Other Ambulatory Visit: Payer: Self-pay

## 2020-01-18 DIAGNOSIS — Z1231 Encounter for screening mammogram for malignant neoplasm of breast: Secondary | ICD-10-CM

## 2020-01-18 IMAGING — MG DIGITAL SCREENING BILAT W/ TOMO W/ CAD
8 series · 8 of 24 positions shown · non-contrast
Comparison: Previous exam(s).

ACR Breast Density Category a: The breast tissue is almost entirely
fatty.

CLINICAL DATA: Screening.

EXAM:
DIGITAL SCREENING BILATERAL MAMMOGRAM WITH TOMO AND CAD

[L MLO synth-2D]
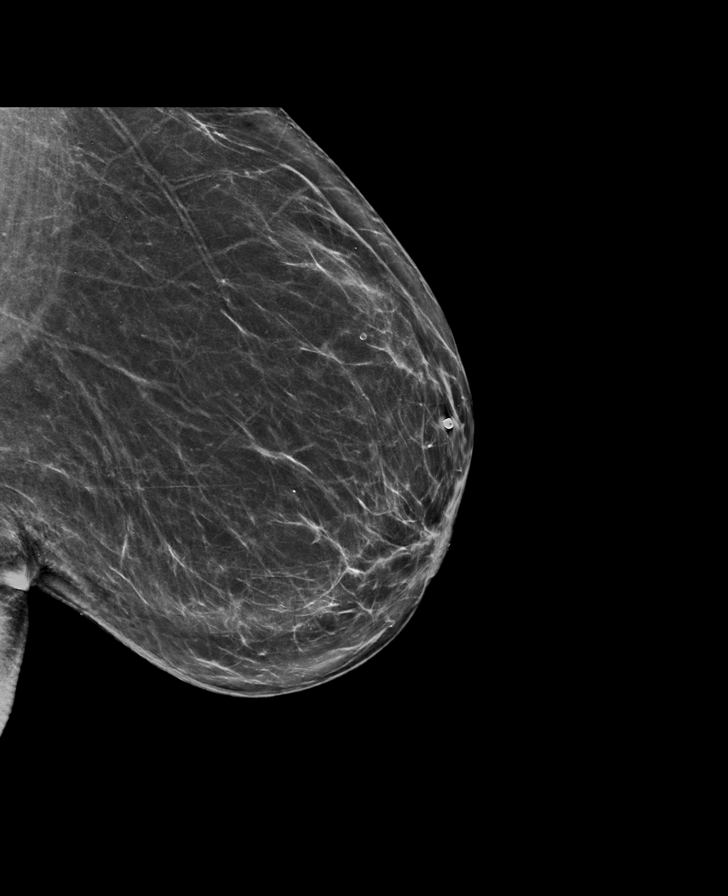

[L CC synth-2D]
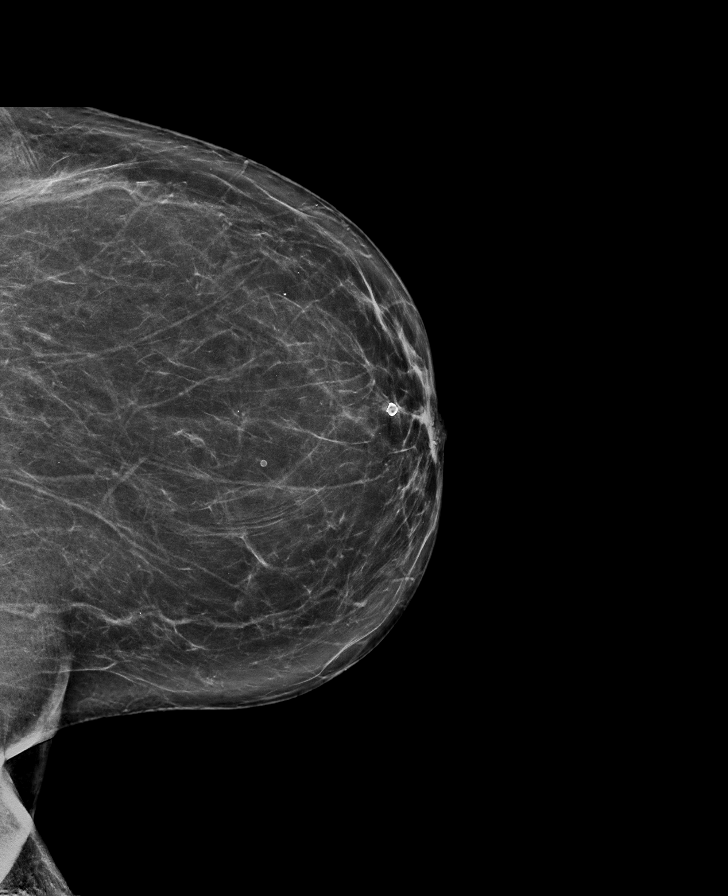

[R MLO synth-2D]
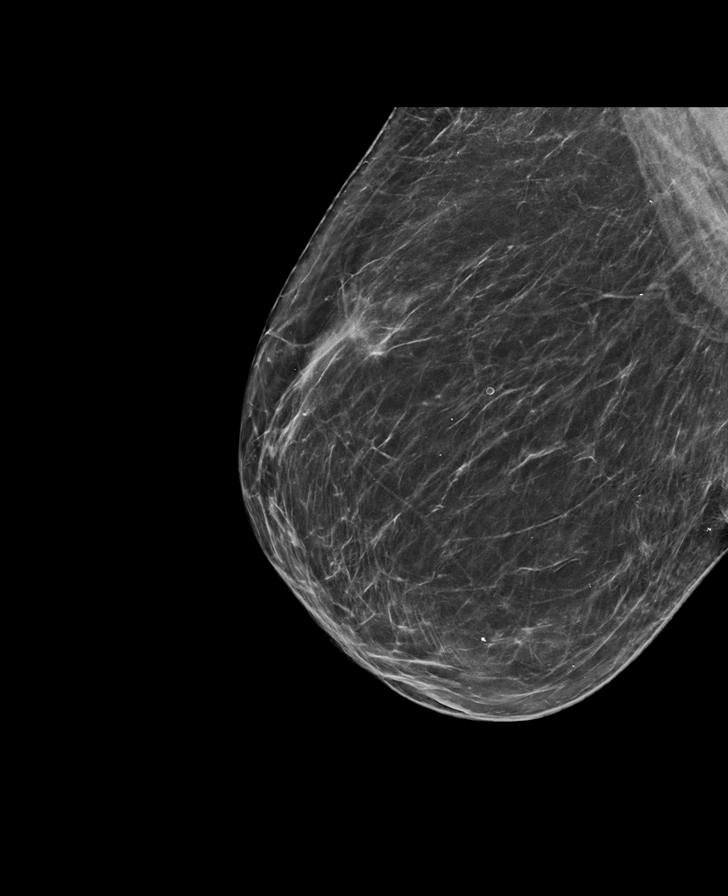

[R CC synth-2D]
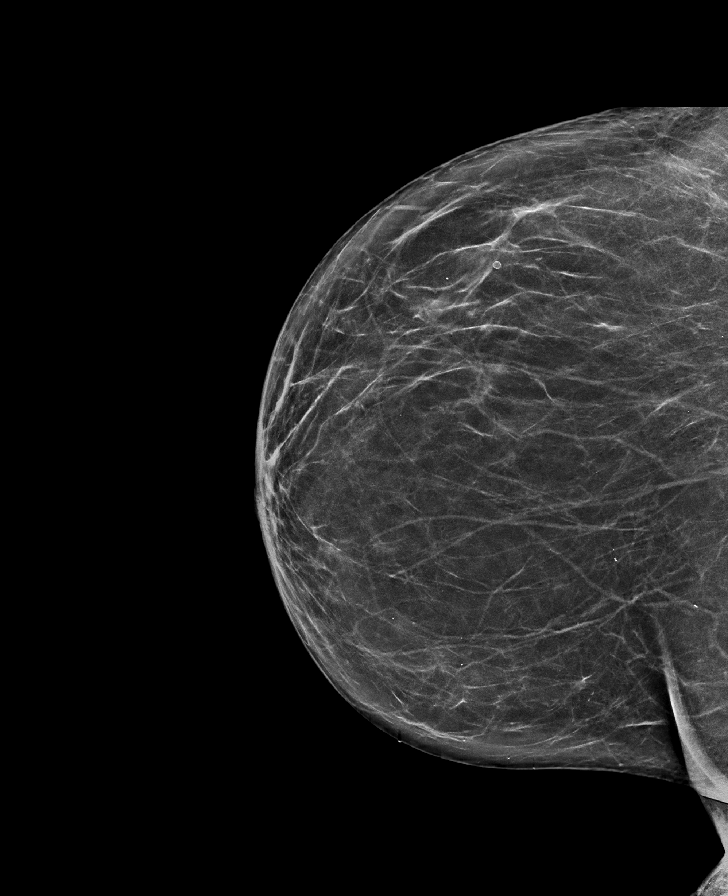

[R CC tomo · tomo slice 36/71.0]
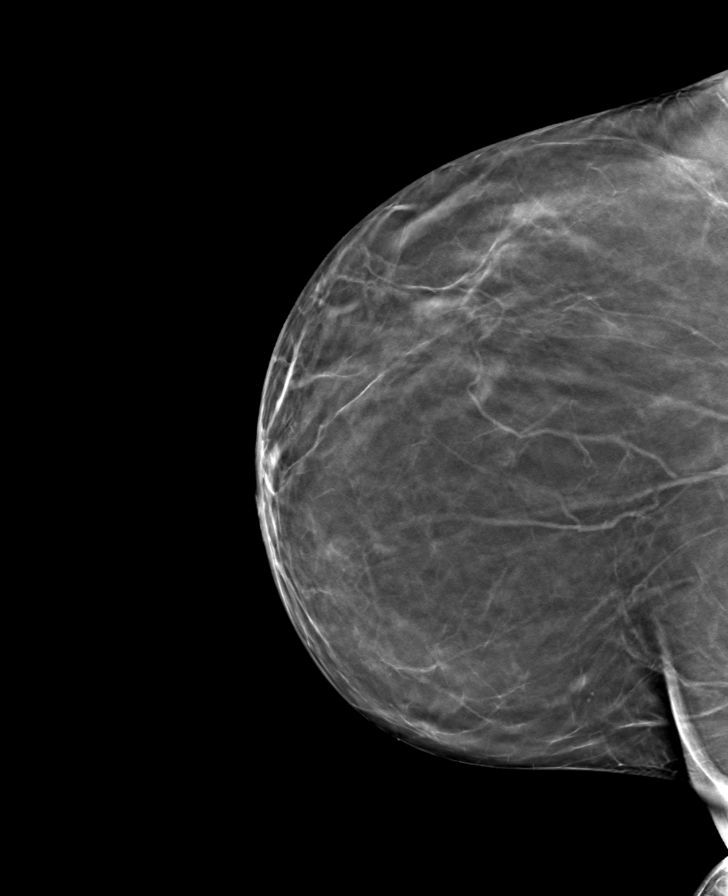

[L CC tomo · tomo slice 39/76.0]
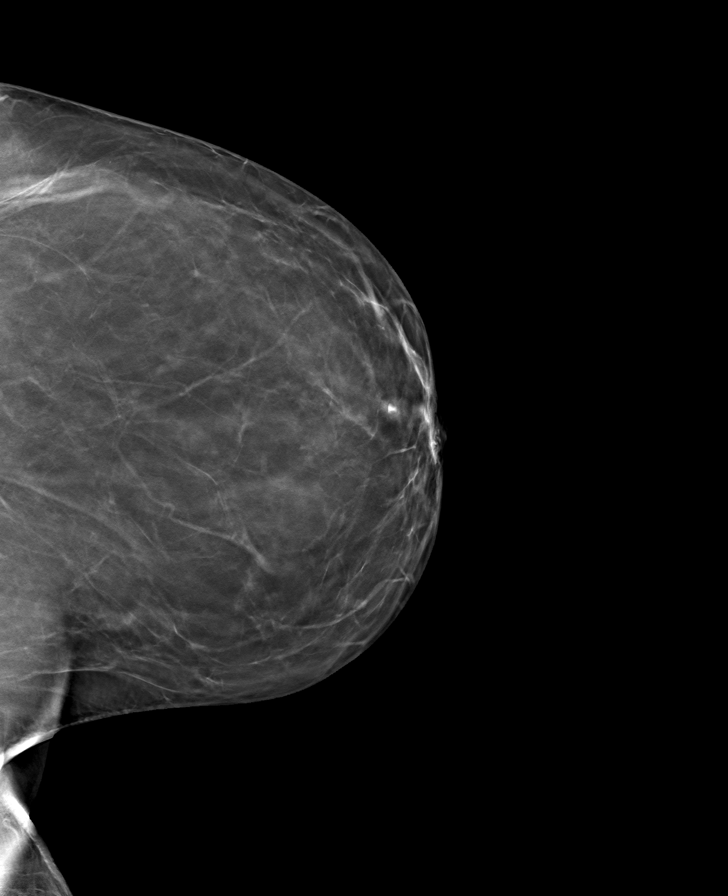

[L MLO tomo · tomo slice 40/79.0]
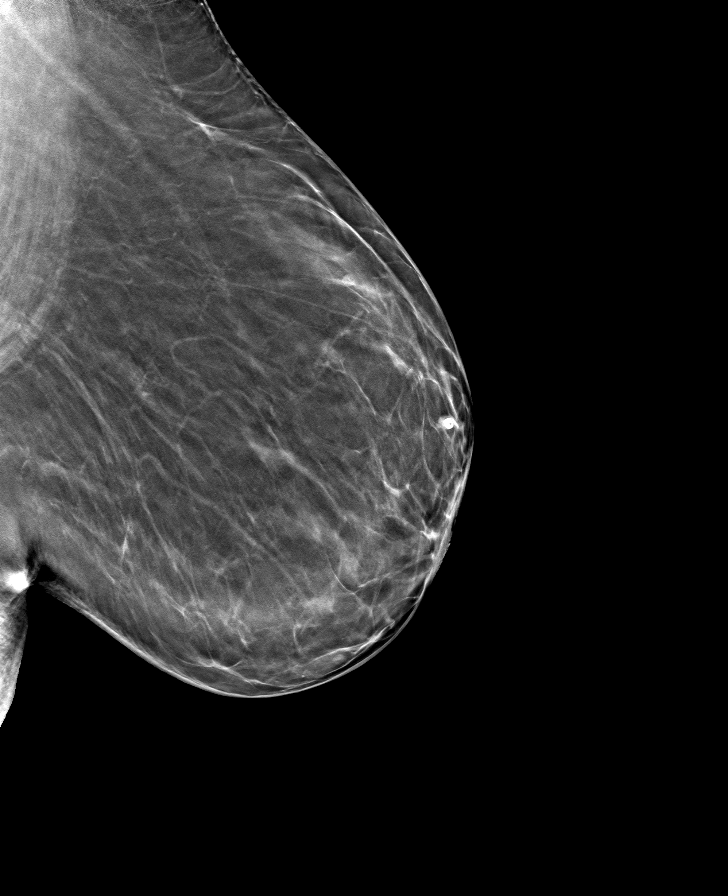

[R MLO tomo · tomo slice 39/78.0]
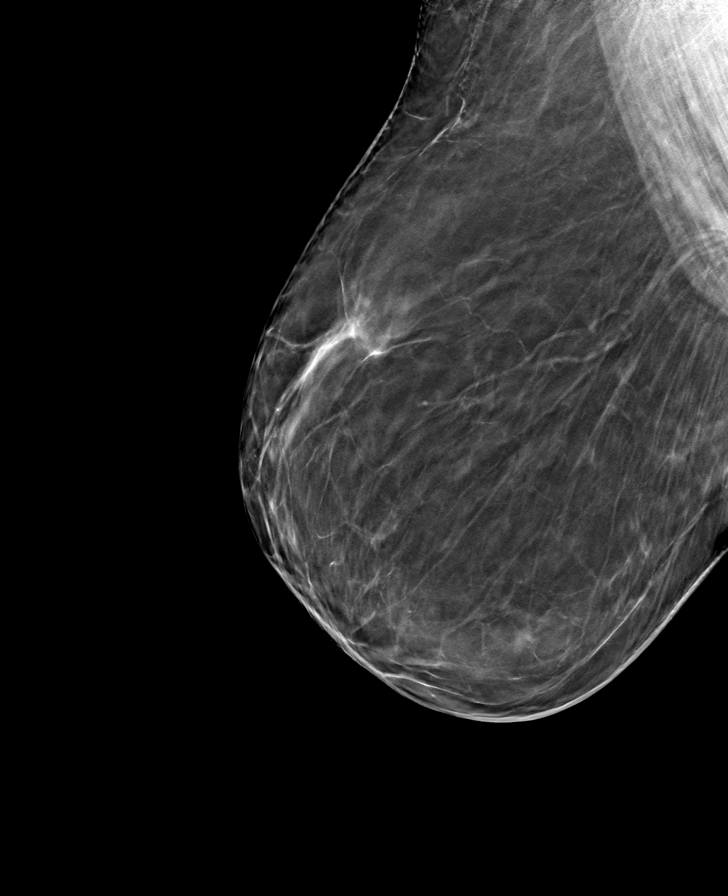

[8 of 24 positions shown; findings below may reference images not displayed]

FINDINGS: There are no findings suspicious for malignancy. Images were
processed with CAD.
IMPRESSION: No mammographic evidence of malignancy. A result letter of this
screening mammogram will be mailed directly to the patient.

RECOMMENDATION:
Screening mammogram in one year. (Code:[TA])

BI-RADS CATEGORY  1: Negative.

## 2020-03-17 ENCOUNTER — Other Ambulatory Visit: Payer: Self-pay

## 2020-03-18 ENCOUNTER — Ambulatory Visit (INDEPENDENT_AMBULATORY_CARE_PROVIDER_SITE_OTHER): Payer: Medicare Other | Admitting: Cardiology

## 2020-03-18 ENCOUNTER — Encounter: Payer: Self-pay | Admitting: Cardiology

## 2020-03-18 ENCOUNTER — Other Ambulatory Visit: Payer: Self-pay

## 2020-03-18 VITALS — BP 148/92 | HR 67 | Ht <= 58 in | Wt 207.2 lb

## 2020-03-18 DIAGNOSIS — I2510 Atherosclerotic heart disease of native coronary artery without angina pectoris: Secondary | ICD-10-CM

## 2020-03-18 DIAGNOSIS — I714 Abdominal aortic aneurysm, without rupture, unspecified: Secondary | ICD-10-CM

## 2020-03-18 DIAGNOSIS — E785 Hyperlipidemia, unspecified: Secondary | ICD-10-CM

## 2020-03-18 DIAGNOSIS — R079 Chest pain, unspecified: Secondary | ICD-10-CM

## 2020-03-18 DIAGNOSIS — J4540 Moderate persistent asthma, uncomplicated: Secondary | ICD-10-CM

## 2020-03-18 DIAGNOSIS — I712 Thoracic aortic aneurysm, without rupture, unspecified: Secondary | ICD-10-CM

## 2020-03-18 NOTE — Patient Instructions (Signed)
Medication Instructions:  Your physician recommends that you continue on your current medications as directed. Please refer to the Current Medication list given to you today.  *If you need a refill on your cardiac medications before your next appointment, please call your pharmacy*   Lab Work: None.  If you have labs (blood work) drawn today and your tests are completely normal, you will receive your results only by: Marland Kitchen MyChart Message (if you have MyChart) OR . A paper copy in the mail If you have any lab test that is abnormal or we need to change your treatment, we will call you to review the results.   Testing/Procedures:   [facility] [address] [place], [place] [address] Phone:  [phone]    Please arrive 15 minutes prior to your appointment time for registration and insurance purposes.  The test will take approximately 3 to 4 hours to complete; you may bring reading material.  If someone comes with you to your appointment, they will need to remain in the main lobby due to limited space in the testing area. **If you are pregnant or breastfeeding, please notify the nuclear lab prior to your appointment**  How to prepare for your Myocardial Perfusion Test: . Do not eat or drink 3 hours prior to your test, except you may have water. . Do not consume products containing caffeine (regular or decaffeinated) 12 hours prior to your test. (ex: coffee, chocolate, sodas, tea). Do bring a list of your current medications with you.  If not listed below, you may take your medications as normal. . Do not take metoprolol (Lopressor, Toprol) for 24 hours prior to the test.  Bring the medication to your appointment as you may be required to take it once the test is complete. .  . Do wear comfortable clothes (no dresses or overalls) and walking shoes, tennis shoes preferred (No heels or open toe shoes are allowed). . Do NOT wear cologne, perfume, aftershave, or  lotions (deodorant is allowed). . If these instructions are not followed, your test will have to be rescheduled.  Please report to [address] for your test.  If you have questions or concerns about your appointment, you can call the [facility] at [phone].  If you cannot keep your appointment, please provide 24 hours notification to the Nuclear Lab, to avoid a possible $50 charge to your account.    Follow-Up: At [facility], you and your health needs are our priority.  As part of our continuing mission to provide you with exceptional heart care, we have created designated Provider Care Teams.  These Care Teams include your primary Cardiologist (physician) and Advanced Practice Providers (APPs -  Physician Assistants and Nurse Practitioners) who all work together to provide you with the care you need, when you need it.  We recommend signing up for the patient portal called "MyChart".  Sign up information is provided on this After Visit Summary.  MyChart is used to connect with patients for Virtual Visits (Telemedicine).  Patients are able to view lab/test results, encounter notes, upcoming appointments, etc.  Non-urgent messages can be sent to your provider as well.   To learn more about what you can do with MyChart, go to [email or web address].    Your next appointment:   3 month(s)  The format for your next appointment:   In Person  Provider:   [name], MD   Other Instructions   Cardiac Nuclear  Scan A cardiac nuclear scan is a test that measures blood flow to the heart when a person is resting and when he or she is exercising. The test looks for problems such as:  Not enough blood reaching a portion of the heart.  The heart muscle not working normally. You may need this test if:  You have heart disease.  You have had abnormal lab results.  You have had heart surgery or a balloon procedure to open up blocked  arteries (angioplasty).  You have chest pain.  You have shortness of breath. In this test, a radioactive dye (tracer) is injected into your bloodstream. After the tracer has traveled to your heart, an imaging device is used to measure how much of the tracer is absorbed by or distributed to various areas of your heart. This procedure is usually done at a hospital and takes 2-4 hours. Tell a health care provider about:  Any allergies you have.  All medicines you are taking, including vitamins, herbs, eye drops, creams, and over-the-counter medicines.  Any problems you or family members have had with anesthetic medicines.  Any blood disorders you have.  Any surgeries you have had.  Any medical conditions you have.  Whether you are pregnant or may be pregnant. What are the risks? Generally, this is a safe procedure. However, problems may occur, including:  Serious chest pain and heart attack. This is only a risk if the stress portion of the test is done.  Rapid heartbeat.  Sensation of warmth in your chest. This usually passes quickly.  Allergic reaction to the tracer. What happens before the procedure?  Ask your health care provider about changing or stopping your regular medicines. This is especially important if you are taking diabetes medicines or blood thinners.  Follow instructions from your health care provider about eating or drinking restrictions.  Remove your jewelry on the day of the procedure. What happens during the procedure?  An IV will be inserted into one of your veins.  Your health care provider will inject a small amount of radioactive tracer through the IV.  You will wait for 20-40 minutes while the tracer travels through your bloodstream.  Your heart activity will be monitored with an electrocardiogram (ECG).  You will lie down on an exam table.  Images of your heart will be taken for about 15-20 minutes.  You may also have a stress test. For this  test, one of the following may be done: ? You will exercise on a treadmill or stationary bike. While you exercise, your heart's activity will be monitored with an ECG, and your blood pressure will be checked. ? You will be given medicines that will increase blood flow to parts of your heart. This is done if you are unable to exercise.  When blood flow to your heart has peaked, a tracer will again be injected through the IV.  After 20-40 minutes, you will get back on the exam table and have more images taken of your heart.  Depending on the type of tracer used, scans may need to be repeated 3-4 hours later.  Your IV line will be removed when the procedure is over. The procedure may vary among health care providers and hospitals. What happens after the procedure?  Unless your health care provider tells you otherwise, you may return to your normal schedule, including diet, activities, and medicines.  Unless your health care provider tells you otherwise, you may increase your fluid intake. This will help to  flush the contrast dye from your body. Drink enough fluid to keep your urine pale yellow.  Ask your health care provider, or the department that is doing the test: ? When will my results be ready? ? How will I get my results? Summary  A cardiac nuclear scan measures the blood flow to the heart when a person is resting and when he or she is exercising.  Tell your health care provider if you are pregnant.  Before the procedure, ask your health care provider about changing or stopping your regular medicines. This is especially important if you are taking diabetes medicines or blood thinners.  After the procedure, unless your health care provider tells you otherwise, increase your fluid intake. This will help flush the contrast dye from your body.  After the procedure, unless your health care provider tells you otherwise, you may return to your normal schedule, including diet, activities,  and medicines. This information is not intended to replace advice given to you by your health care provider. Make sure you discuss any questions you have with your health care provider. Document Revised: 04/03/2018 Document Reviewed: 04/03/2018 Elsevier Patient Education  2020 [employer].

## 2020-03-18 NOTE — Progress Notes (Signed)
Cardiology Office Note:    Date:  03/18/2020   ID:  Alyssa Rosales, DOB 12-30-1950, MRN [id number]  PCP:  [name], MD  Cardiologist:  [name], MD    Referring MD: [name], MD   Chief Complaint  Patient presents with  . Follow-up    6 MO FU   I have a chest pain  History of Present Illness:    Alyssa Rosales is a 69 y.o. female with past medical history significant for thoracic aneurysm measuring 4.5 cm, essential hypertension, luminal disease of coronary artery disease, GERD.  She comes today to my office for follow-up.  She complained of having chest pain.  Pain happened in different situation can happen at rest can happen with exercise however not clear-cut reproduction of the pain with exercise.  She takes some aspirin with this and it helps.  She also takes a lot of nonsteroidal anti-inflammatory medication for her hip she takes 800 of Advil in the morning and then 400 mg of Advil afternoon.  She takes a proton pump inhibitor in form of Protonix.  Still continue to work she works hard.  Past Medical History:  Diagnosis Date  . Abdominal aortic aneurysm (AAA) without rupture (HCC) 09/01/2017  . Coronary artery disease involving native coronary artery of native heart without angina pectoris 02/09/2017  . Edema, lower extremity   . GERD without esophagitis 01/07/2016   Last Assessment & Plan:  Continue with the PPI and have refilled this for her  . History of kidney problems   . Hyperlipidemia 01/27/2017   Overview:  Added automatically from request for surgery [phone]  . Hypertension   . Joint pain   . Moderate persistent asthma without complication 08/10/2016  . OSA (obstructive sleep apnea) 09/30/2016   Follows with pulmonary is seen 12/19 for FU bipap 21/15  . Shortness of breath   . Vitamin D deficiency     Past Surgical History:  Procedure Laterality Date  . CORONARY ANGIOPLASTY    . kidney stone removal    . RECTAL SURGERY    . spur  removal    . TOOTH EXTRACTION      Current Medications: Current Meds  Medication Sig  . amLODipine (NORVASC) 2.5 MG tablet Take 2.5 mg by mouth daily.  . APPLE CIDER VINEGAR PO Take 2 tablets by mouth daily.  . cyclobenzaprine (FLEXERIL) 10 MG tablet Use one-half to one by mouth every 8 hours prn spasm/pain.  Marland Kitchen ibuprofen (ADVIL) 800 MG tablet Take 800 mg by mouth every 8 (eight) hours as needed.  . loperamide (IMODIUM) 2 MG capsule Take 2 mg by mouth as needed for diarrhea or loose stools.  Marland Kitchen losartan (COZAAR) 100 MG tablet Take 100 mg by mouth daily.  Marland Kitchen losartan (COZAAR) 50 MG tablet Take 2 tablets by mouth daily.  . metoprolol succinate (TOPROL XL) 25 MG 24 hr tablet Take 1 tablet (25 mg total) by mouth daily.  . niacin 500 MG tablet Take 500 mg by mouth at bedtime.  . nitroGLYCERIN (NITROSTAT) 0.4 MG SL tablet Place 1 tablet (0.4 mg total) under the tongue every 5 (five) minutes as needed for up to 10 doses for chest pain.  . pantoprazole (PROTONIX) 40 MG tablet Take 40 mg by mouth daily.  Marland Kitchen pyridoxine (B-6) 500 MG tablet Take 500 mg by mouth daily.  Marland Kitchen torsemide (DEMADEX) 10 MG tablet Take 1 tablet by mouth 2 (two) times daily.  . traMADol (ULTRAM) 50 MG  tablet Take 50 mg by mouth every 6 (six) hours as needed.  . vitamin B-12 (CYANOCOBALAMIN) 1000 MCG tablet Take 1,000 mcg by mouth daily.  . Vitamin D, Ergocalciferol, (DRISDOL) 1.25 MG (50000 UT) CAPS capsule Take 1 capsule by mouth once a week.     Allergies:   Amoxicillin-pot clavulanate, Penicillins, Propoxyphene, and Sulfamethoxazole   Social History   Socioeconomic History  . Marital status: Divorced    Spouse name: Not on file  . Number of children: Not on file  . Years of education: Not on file  . Highest education level: Not on file  Occupational History  . Occupation: [employer]   Tobacco Use  . Smoking status: Never Smoker  . Smokeless tobacco: Never Used  Substance and Sexual Activity  . Alcohol use: Never  .  Drug use: Never  . Sexual activity: Not on file  Other Topics Concern  . Not on file  Social History Narrative  . Not on file   Social Determinants of Health   Financial Resource Strain:   . Difficulty of Paying Living Expenses:   Food Insecurity:   . Worried About Charity fundraiser in the Last Year:   . Arboriculturist in the Last Year:   Transportation Needs:   . Film/video editor (Medical):   Marland Kitchen Lack of Transportation (Non-Medical):   Physical Activity:   . Days of Exercise per Week:   . Minutes of Exercise per Session:   Stress:   . Feeling of Stress :   Social Connections:   . Frequency of Communication with Friends and Family:   . Frequency of Social Gatherings with Friends and Family:   . Attends Religious Services:   . Active Member of Clubs or Organizations:   . Attends Archivist Meetings:   Marland Kitchen Marital Status:      Family History: The patient's family history includes Alzheimer's disease in her mother; Diabetes in her mother; Hyperlipidemia in her father and mother; Hypertension in her father and mother. There is no history of Breast cancer. ROS:   Please see the history of present illness.    All 14 point review of systems negative except as described per history of present illness  EKGs/Labs/Other Studies Reviewed:      Recent Labs: 11/27/2019: ALT 10; BUN 30; Creatinine, Ser 0.84; Hemoglobin 15.2; Magnesium 1.9; Platelets 210; Potassium 3.7; Sodium 144; TSH 0.553  Recent Lipid Panel    Component Value Date/Time   CHOL 206 (H) 11/27/2019 1553   TRIG 90 11/27/2019 1553   HDL 57 11/27/2019 1553   CHOLHDL 4.1 11/09/2018 1040   LDLCALC 133 (H) 11/27/2019 1553    Physical Exam:    VS:  BP (!) 148/92   Pulse 67   Ht 4\' 9"  (1.448 m)   Wt 207 lb 3.2 oz (94 kg)   SpO2 97%   BMI 44.84 kg/m     Wt Readings from Last 3 Encounters:  03/18/20 207 lb 3.2 oz (94 kg)  12/31/19 207 lb (93.9 kg)  12/11/19 204 lb (92.5 kg)     GEN:  Well  nourished, well developed in no acute distress HEENT: Normal NECK: No JVD; No carotid bruits LYMPHATICS: No lymphadenopathy CARDIAC: RRR, no murmurs, no rubs, no gallops RESPIRATORY:  Clear to auscultation without rales, wheezing or rhonchi  ABDOMEN: Soft, non-tender, non-distended MUSCULOSKELETAL:  No edema; No deformity  SKIN: Warm and dry LOWER EXTREMITIES: no swelling NEUROLOGIC:  Alert and oriented x 3  PSYCHIATRIC:  Normal affect   ASSESSMENT:    1. Chest pain of uncertain etiology   2. Coronary artery disease involving native coronary artery of native heart without angina pectoris   3. Thoracic aortic aneurysm without rupture (HCC)   4. Abdominal aortic aneurysm (AAA) without rupture (HCC)   5. Moderate persistent asthma without complication   6. Dyslipidemia    PLAN:    In order of problems listed above:  1. Chest pain somewhat atypical there are potential other explanation for her pain including history of GERD, the fact that she takes a lot of nonsteroidal anti-inflammatory medication, however, in view of the fact that she did have luminal disease in her cardiac catheterization few years ago I will schedule her to have Lexiscan.  I asked her to start taking 1 baby aspirin every single day and use nitroglycerin on as-needed basis. 2. Abdominal arctic aneurysm only 3 cm, continue monitoring, she also have ascending thoracic aortic aneurysm measuring 4.6 cm last measurement.  We will continue monitoring. 3. Dyslipidemia we will schedule her to have fasting lipid profile done to decide about therapy.  Also will wait for results of stress test..  She was told to go to the emergency room if pain is not relieved by nitroglycerin.   Medication Adjustments/Labs and Tests Ordered: Current medicines are reviewed at length with the patient today.  Concerns regarding medicines are outlined above.  Orders Placed This Encounter  Procedures  . Myocardial Perfusion Imaging   Medication  changes: No orders of the defined types were placed in this encounter.   Signed, Georgeanna Lea, MD, [facility] 03/18/2020 1:53 PM    [facility]

## 2020-03-19 ENCOUNTER — Telehealth: Payer: Self-pay | Admitting: Cardiology

## 2020-03-19 NOTE — Telephone Encounter (Signed)
New message   Patient states that she has the paperwork for a stress test. The order is not in the system. Please call the patient to setup the appt.

## 2020-03-20 ENCOUNTER — Telehealth: Payer: Self-pay | Admitting: Emergency Medicine

## 2020-03-20 NOTE — Telephone Encounter (Signed)
Left message for patient to return call.

## 2020-03-20 NOTE — Telephone Encounter (Signed)
Follow up   Patient is returning your call per your previous message. The patient states that she is off work at this and would like a call to before 5:00 pm today if possible.

## 2020-03-24 NOTE — Telephone Encounter (Signed)
Follow up  Pt is calling back to follow up, pt wanted to know if Dr. Bing Matter still wants her to get stress test

## 2020-03-25 NOTE — Telephone Encounter (Signed)
Called patient she reports that she needs to be rescheduled for her lexiscan I have reached out to the scheduler and will informed patient of the appointment once scheduled. No further questions.

## 2020-03-25 NOTE — Telephone Encounter (Signed)
Follow up    Pt is returning call  She would like a call back today    Please call back

## 2020-03-25 NOTE — Telephone Encounter (Signed)
Left message for patient to return call.

## 2020-03-26 NOTE — Telephone Encounter (Signed)
Left message for patient to return call regar

## 2020-03-27 NOTE — Telephone Encounter (Signed)
Called patient. She was upset that her lexiscan had to be changed to a two day appointment rather than a 1 day as she had already asked off for work. I apologized for this inconvenience and explained why she needed to be a two day based on weight and bmi. She was not happy about this but understood. She asked me to cancel her appointments for now and she will talk with her boss and let us know when she can get two days off in a row. I told her to make sure it is a Tuesday and Wednesday. No further questions.

## 2020-03-27 NOTE — Telephone Encounter (Signed)
Patient returning call. She states she cannot do the 2 day stress test and does not understand why she needs a 2 day test. She is requesting to speak with [facility] and states if she does not receive a call back by 5:00pm today she will come to the office to find out what is going on.

## 2020-03-27 NOTE — Telephone Encounter (Signed)
Left message for patient to return call. Her lexiscan has been rescheduled as a two day I need to let her know this.

## 2020-04-08 ENCOUNTER — Other Ambulatory Visit: Payer: Self-pay | Admitting: Cardiology

## 2020-04-08 DIAGNOSIS — I2510 Atherosclerotic heart disease of native coronary artery without angina pectoris: Secondary | ICD-10-CM

## 2020-04-15 ENCOUNTER — Ambulatory Visit: Payer: Medicare Other

## 2020-04-16 ENCOUNTER — Ambulatory Visit: Payer: Medicare Other

## 2020-05-14 ENCOUNTER — Telehealth (HOSPITAL_COMMUNITY): Payer: Self-pay | Admitting: *Deleted

## 2020-05-14 NOTE — Telephone Encounter (Signed)
Left message on voicemail in reference to upcoming appointment scheduled for 7/0/21. Phone number given for a call back so details instructions can be given. Alyssa Rosales

## 2020-05-15 NOTE — Telephone Encounter (Signed)
Patient is returning your call. Please call patient back at home, [phone].

## 2020-05-20 ENCOUNTER — Other Ambulatory Visit: Payer: Self-pay | Admitting: Rehabilitation

## 2020-05-20 ENCOUNTER — Ambulatory Visit (INDEPENDENT_AMBULATORY_CARE_PROVIDER_SITE_OTHER): Payer: Medicare Other

## 2020-05-20 ENCOUNTER — Other Ambulatory Visit: Payer: Self-pay

## 2020-05-20 DIAGNOSIS — R079 Chest pain, unspecified: Secondary | ICD-10-CM

## 2020-05-20 DIAGNOSIS — M1612 Unilateral primary osteoarthritis, left hip: Secondary | ICD-10-CM

## 2020-05-20 MED ORDER — REGADENOSON 0.4 MG/5ML IV SOLN
0.4000 mg | Freq: Once | INTRAVENOUS | Status: AC
  Administered 2020-05-20: 0.4 mg via INTRAVENOUS

## 2020-05-20 MED ORDER — TECHNETIUM TC 99M TETROFOSMIN IV KIT
32.3000 | PACK | Freq: Once | INTRAVENOUS | Status: AC | PRN
  Administered 2020-05-20: 32.3 via INTRAVENOUS

## 2020-05-21 ENCOUNTER — Ambulatory Visit: Payer: Medicare Other

## 2020-05-21 MED ORDER — TECHNETIUM TC 99M TETROFOSMIN IV KIT
30.0000 | PACK | Freq: Once | INTRAVENOUS | Status: AC | PRN
  Administered 2020-05-21: 30 via INTRAVENOUS

## 2020-05-22 LAB — MYOCARDIAL PERFUSION IMAGING
LV dias vol: 66 mL (ref 46–106)
LV sys vol: 28 mL
Peak HR: 79 {beats}/min
Rest HR: 64 {beats}/min
SDS: 1
SRS: 3
SSS: 4
TID: 1.05

## 2020-05-26 ENCOUNTER — Telehealth: Payer: Self-pay | Admitting: Cardiology

## 2020-05-26 NOTE — Telephone Encounter (Signed)
New Message    Patient is returning call to obtain stress test results. Please call to discuss.

## 2020-05-26 NOTE — Telephone Encounter (Signed)
Called patient and informed of her of results.

## 2020-06-14 ENCOUNTER — Ambulatory Visit
Admission: RE | Admit: 2020-06-14 | Discharge: 2020-06-14 | Disposition: A | Discharge status: Home / Self Care | Payer: Medicare Other | Source: Ambulatory Visit | Attending: Rehabilitation | Admitting: Rehabilitation

## 2020-06-14 DIAGNOSIS — M1612 Unilateral primary osteoarthritis, left hip: Secondary | ICD-10-CM

## 2020-06-14 IMAGING — MR MR HIP*L* W/O CM
4 of 5 series · 21 of 40 positions shown · non-contrast
Comparison: None.

CLINICAL DATA: Left hip pain for 2 years. No known injury.

EXAM:
MR OF THE LEFT HIP WITHOUT CONTRAST
TECHNIQUE: Multiplanar, multisequence MR imaging was performed. No intravenous
contrast was administered.

[Series 4: T1 · coronal · 4.0mm · 0.74mm/px · 7 of 21 slices shown]
[im 1/21]
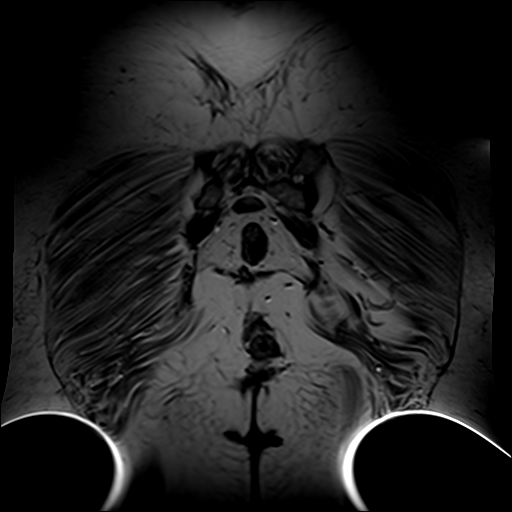
[im 4/21]
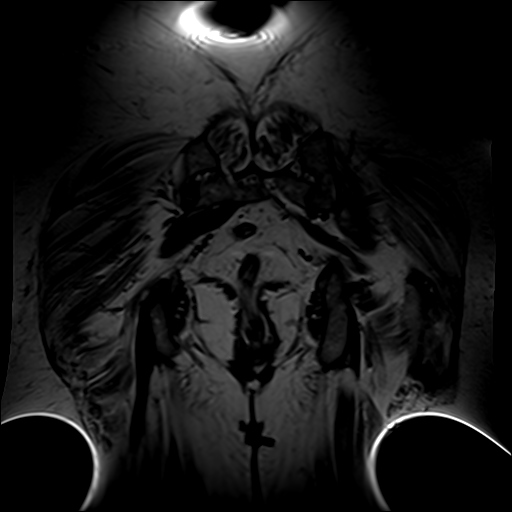
[im 7/21]
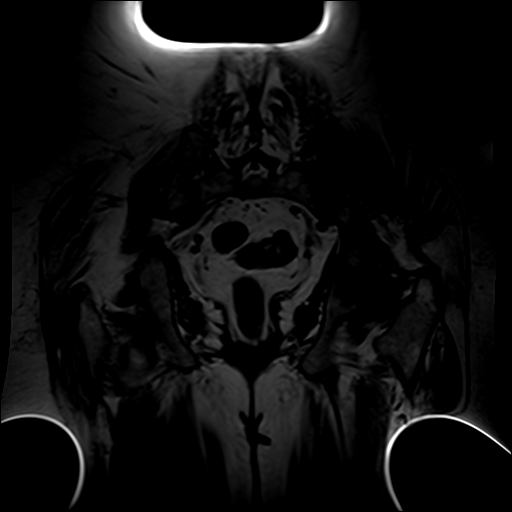
[im 11/21]
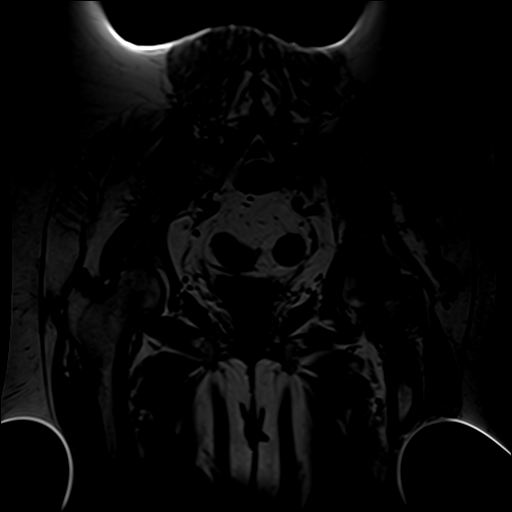
[im 14/21]
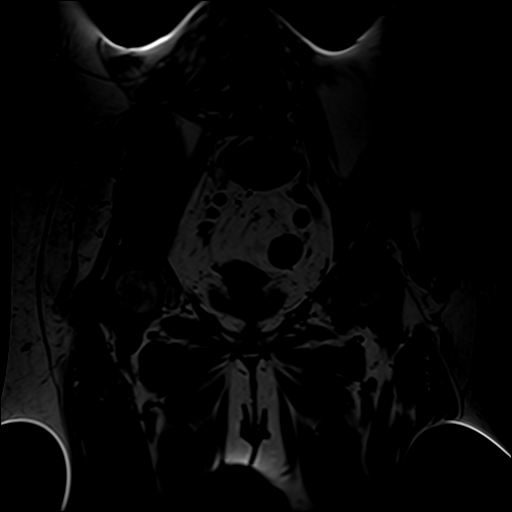
[im 17/21]
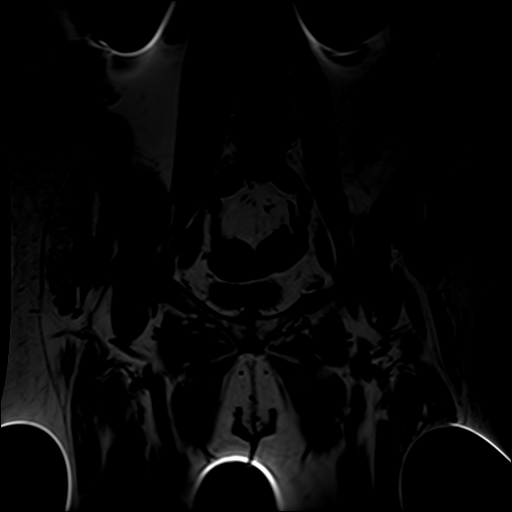
[im 21/21]
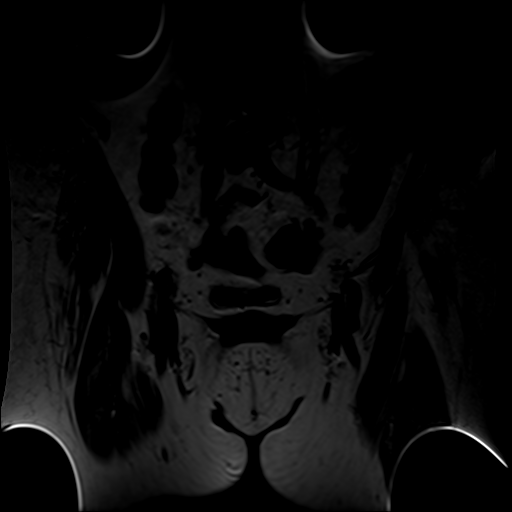

[Series 5: T2 fat-sat · coronal · 4.0mm · 0.74mm/px · 8 of 24 slices shown (1 of 2)]
[im 1/24]
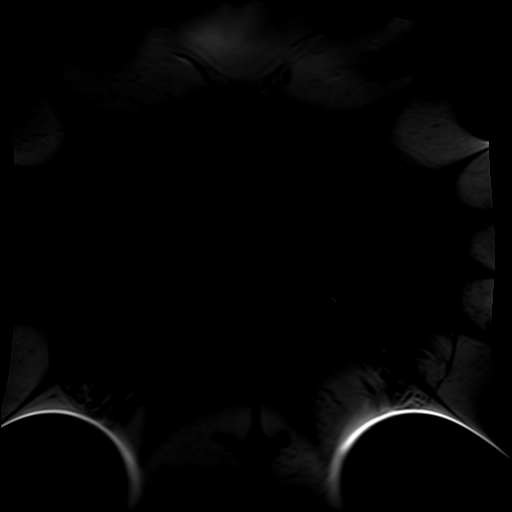
[im 3/24]
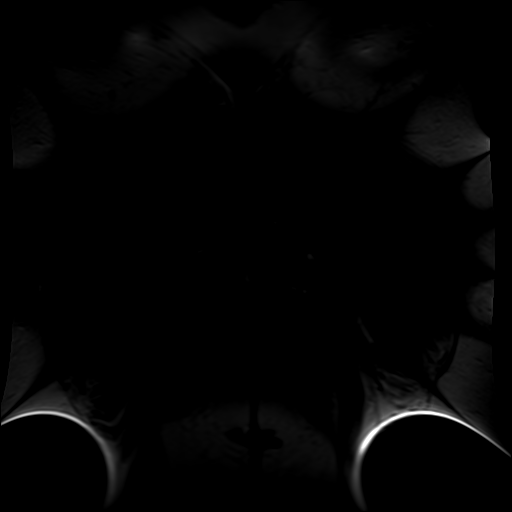
[im 6/24]
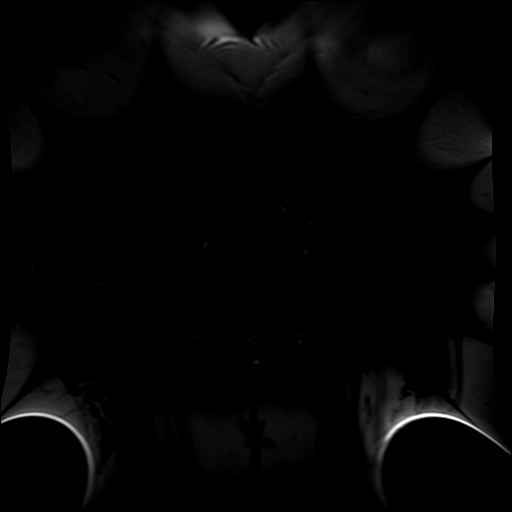
[im 9/24]
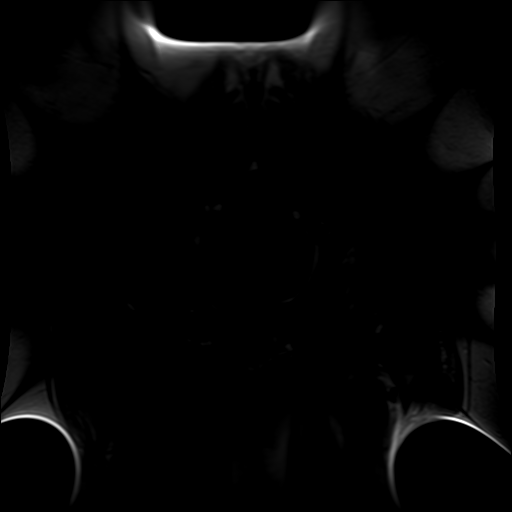
[im 12/24]
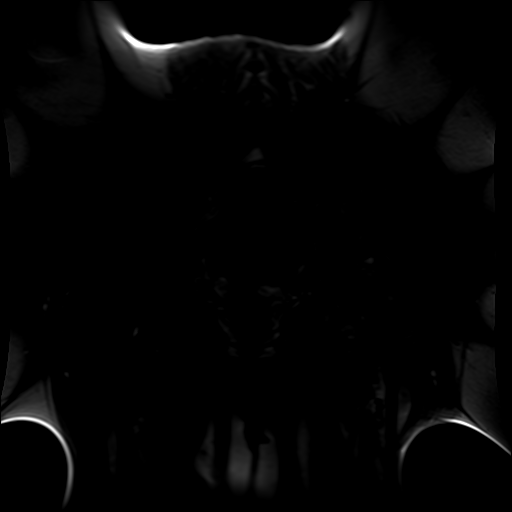
[im 15/24]
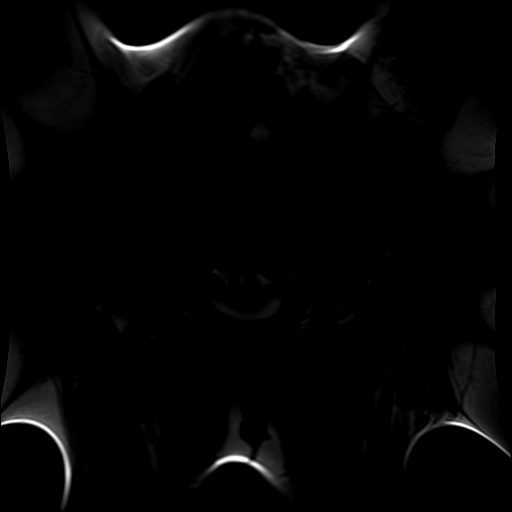
[im 18/24]
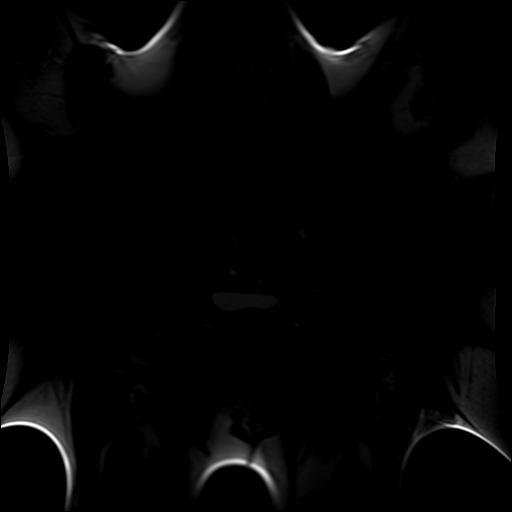
[im 21/24]
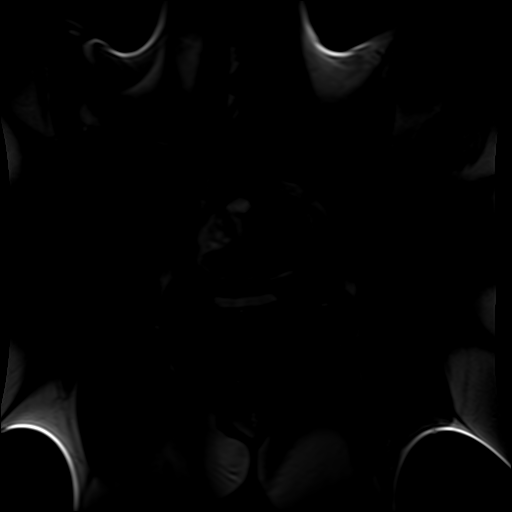

[Series 6: T2 fat-sat · axial · 4.0mm · 0.35mm/px · z∈[-53,+37]mm · 3 of 26 slices shown (2 of 2)]
[im 4/26]
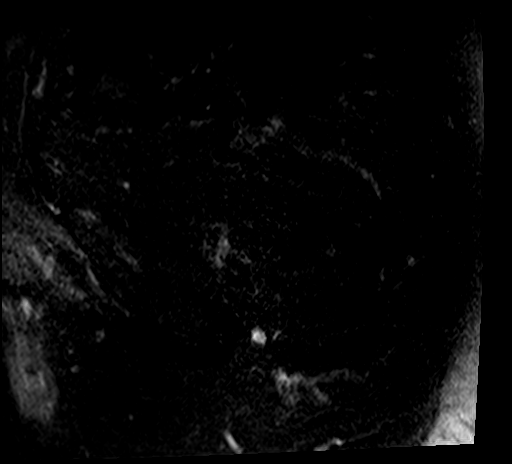
[im 13/26]
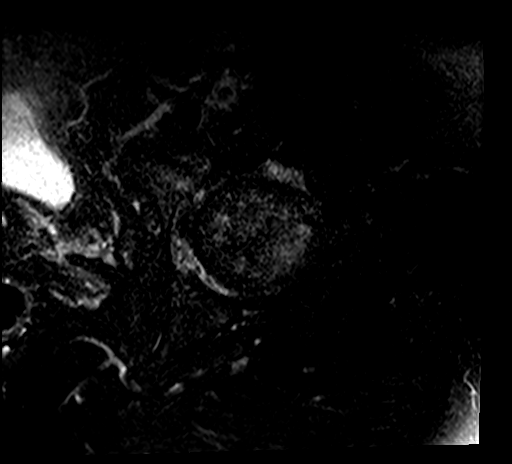
[im 22/26]
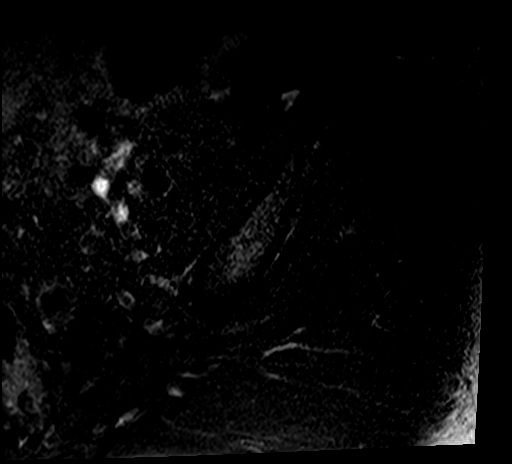

[Series 7: PD fat-sat · coronal · 4.0mm · 0.70mm/px · 3 of 19 slices shown]
[im 4/19]
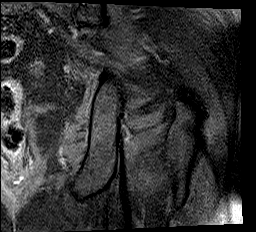
[im 10/19]
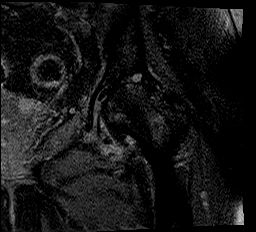
[im 16/19]
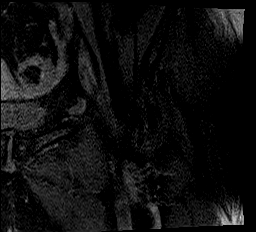

[21 of 40 positions shown; findings below may reference images not displayed]

FINDINGS: Bones:

No hip fracture, dislocation or avascular necrosis. No periosteal
reaction or bone destruction. No aggressive osseous lesion.

Normal sacrum and sacroiliac joints. No SI joint widening or erosive
changes.

Degenerative disease with disc height loss and facet arthropathy of
the lower lumbar spine.

Articular cartilage and labrum

Articular cartilage: Extensive full-thickness cartilage loss of the
left femoral head and acetabulum with subchondral reactive marrow
edema and cystic changes. Partial-thickness cartilage loss of the
right femoral head and acetabulum.

Labrum:  Maceration of the left labrum.

Joint or bursal effusion

Joint effusion: Large left hip joint effusion with synovitis. No
right hip joint effusion. No SI joint effusion.

Bursae: No bursa formation.

Muscles and tendons

Flexors: Normal.

Extensors: Normal.

Abductors: Normal.

Adductors: Normal.

Gluteals: Normal.

Hamstrings: Normal.

Other findings

No pelvic free fluid. No fluid collection or hematoma. No inguinal
lymphadenopathy. No inguinal hernia. Diverticulosis without evidence
of diverticulitis.
IMPRESSION: 1. Severe advanced osteoarthritis of the left hip. Large left hip
joint effusion with synovitis.
2. Mild osteoarthritis of the right hip.
3. Lower lumbar spine spondylosis.

## 2020-06-17 HISTORY — DX: Morbid (severe) obesity due to excess calories: E66.01

## 2020-10-08 ENCOUNTER — Other Ambulatory Visit: Payer: Self-pay | Admitting: Orthopedic Surgery

## 2020-10-14 ENCOUNTER — Other Ambulatory Visit: Payer: Self-pay

## 2020-10-14 ENCOUNTER — Encounter (HOSPITAL_BASED_OUTPATIENT_CLINIC_OR_DEPARTMENT_OTHER): Payer: Self-pay | Admitting: Orthopedic Surgery

## 2020-10-14 NOTE — Progress Notes (Addendum)
Pt chart, including cardiac history, BMI, and medical history/preo-operative medical clearance, reviewed with Dr. Donavan Foil, anesthesiologist. No cardiac clearance needed and okay to proceed with surgery as scheduled per Dr. Donavan Foil. Pt does still need to come in for anesthesia consult d/t BMI >45.

## 2020-10-17 ENCOUNTER — Encounter (HOSPITAL_BASED_OUTPATIENT_CLINIC_OR_DEPARTMENT_OTHER)
Admission: RE | Admit: 2020-10-17 | Discharge: 2020-10-17 | Disposition: A | Discharge status: Home / Self Care | Payer: Medicare Other | Source: Ambulatory Visit | Attending: Orthopedic Surgery | Admitting: Orthopedic Surgery

## 2020-10-17 ENCOUNTER — Other Ambulatory Visit: Payer: Self-pay

## 2020-10-17 ENCOUNTER — Other Ambulatory Visit (HOSPITAL_COMMUNITY)
Admission: RE | Admit: 2020-10-17 | Discharge: 2020-10-17 | Disposition: A | Discharge status: Home / Self Care | Payer: Medicare Other | Source: Ambulatory Visit | Attending: Orthopedic Surgery | Admitting: Orthopedic Surgery

## 2020-10-17 DIAGNOSIS — Z01812 Encounter for preprocedural laboratory examination: Secondary | ICD-10-CM | POA: Insufficient documentation

## 2020-10-17 DIAGNOSIS — Z20822 Contact with and (suspected) exposure to covid-19: Secondary | ICD-10-CM | POA: Insufficient documentation

## 2020-10-17 LAB — BASIC METABOLIC PANEL
Anion gap: 9 (ref 5–15)
BUN: 22 mg/dL (ref 8–23)
CO2: 27 mmol/L (ref 22–32)
Calcium: 9.4 mg/dL (ref 8.9–10.3)
Chloride: 110 mmol/L (ref 98–111)
Creatinine, Ser: 0.75 mg/dL (ref 0.44–1.00)
GFR, Estimated: >60 mL/min (ref >60)
Glucose, Bld: 114 mg/dL — ABNORMAL HIGH (ref 70–99)
Potassium: 3.3 mmol/L — ABNORMAL LOW (ref 3.5–5.1)
Sodium: 146 mmol/L — ABNORMAL HIGH (ref 135–145)

## 2020-10-17 LAB — SARS CORONAVIRUS 2 (TAT 6-24 HRS): SARS Coronavirus 2: NEGATIVE

## 2020-10-17 NOTE — Progress Notes (Signed)

## 2020-10-17 NOTE — Progress Notes (Signed)
Pt here for labs, drink, and anesth consult. Height: 58" and weight: 93.9kg. Evaluated by Dr Lacretia Nicks. Sampson Goon who said OK to proceed with planned surgery. Pt given eras drink with instruction to drink it (or equal amount of water) by 0645 on DOS. Pt verbalized understanding.

## 2020-10-21 ENCOUNTER — Ambulatory Visit (HOSPITAL_BASED_OUTPATIENT_CLINIC_OR_DEPARTMENT_OTHER): Payer: Medicare Other | Admitting: Anesthesiology

## 2020-10-21 ENCOUNTER — Encounter (HOSPITAL_BASED_OUTPATIENT_CLINIC_OR_DEPARTMENT_OTHER)
Admission: RE | Disposition: A | Discharge status: Home / Self Care | Payer: Self-pay | Source: Home / Self Care | Attending: Orthopedic Surgery

## 2020-10-21 ENCOUNTER — Other Ambulatory Visit: Payer: Self-pay

## 2020-10-21 ENCOUNTER — Encounter (HOSPITAL_BASED_OUTPATIENT_CLINIC_OR_DEPARTMENT_OTHER): Payer: Self-pay | Admitting: Orthopedic Surgery

## 2020-10-21 ENCOUNTER — Ambulatory Visit (HOSPITAL_BASED_OUTPATIENT_CLINIC_OR_DEPARTMENT_OTHER)
Admission: RE | Admit: 2020-10-21 | Discharge: 2020-10-21 | Disposition: A | Discharge status: Home / Self Care | Payer: Medicare Other | Attending: Orthopedic Surgery | Admitting: Orthopedic Surgery

## 2020-10-21 DIAGNOSIS — I714 Abdominal aortic aneurysm, without rupture: Secondary | ICD-10-CM | POA: Diagnosis not present

## 2020-10-21 DIAGNOSIS — Z8249 Family history of ischemic heart disease and other diseases of the circulatory system: Secondary | ICD-10-CM | POA: Insufficient documentation

## 2020-10-21 DIAGNOSIS — Z888 Allergy status to other drugs, medicaments and biological substances status: Secondary | ICD-10-CM | POA: Insufficient documentation

## 2020-10-21 DIAGNOSIS — I2510 Atherosclerotic heart disease of native coronary artery without angina pectoris: Secondary | ICD-10-CM | POA: Insufficient documentation

## 2020-10-21 DIAGNOSIS — Z8349 Family history of other endocrine, nutritional and metabolic diseases: Secondary | ICD-10-CM | POA: Diagnosis not present

## 2020-10-21 DIAGNOSIS — I10 Essential (primary) hypertension: Secondary | ICD-10-CM | POA: Diagnosis not present

## 2020-10-21 DIAGNOSIS — Z833 Family history of diabetes mellitus: Secondary | ICD-10-CM | POA: Diagnosis not present

## 2020-10-21 DIAGNOSIS — J4540 Moderate persistent asthma, uncomplicated: Secondary | ICD-10-CM | POA: Insufficient documentation

## 2020-10-21 DIAGNOSIS — M86641 Other chronic osteomyelitis, right hand: Secondary | ICD-10-CM | POA: Diagnosis not present

## 2020-10-21 DIAGNOSIS — X58XXXA Exposure to other specified factors, initial encounter: Secondary | ICD-10-CM | POA: Insufficient documentation

## 2020-10-21 DIAGNOSIS — Z882 Allergy status to sulfonamides status: Secondary | ICD-10-CM | POA: Diagnosis not present

## 2020-10-21 DIAGNOSIS — Z880 Allergy status to penicillin: Secondary | ICD-10-CM | POA: Diagnosis not present

## 2020-10-21 DIAGNOSIS — Z820 Family history of epilepsy and other diseases of the nervous system: Secondary | ICD-10-CM | POA: Diagnosis not present

## 2020-10-21 DIAGNOSIS — T8789 Other complications of amputation stump: Secondary | ICD-10-CM | POA: Insufficient documentation

## 2020-10-21 DIAGNOSIS — G4733 Obstructive sleep apnea (adult) (pediatric): Secondary | ICD-10-CM | POA: Insufficient documentation

## 2020-10-21 HISTORY — DX: Calculus of kidney: N20.0

## 2020-10-21 HISTORY — PX: AMPUTATION: SHX166

## 2020-10-21 SURGERY — AMPUTATION DIGIT
Anesthesia: Regional | Site: Finger | Laterality: Right

## 2020-10-21 MED ORDER — TRAMADOL HCL 50 MG PO TABS: 50.0000 mg | ORAL_TABLET | Freq: Four times a day (QID) | ORAL | 0 refills | Status: DC | PRN

## 2020-10-21 MED ORDER — OXYCODONE HCL 5 MG PO TABS
5.0000 mg | ORAL_TABLET | Freq: Once | ORAL | Status: DC | PRN
Start: 2020-10-21 — End: 2020-10-21

## 2020-10-21 MED ORDER — LACTATED RINGERS IV SOLN: INTRAVENOUS | Status: DC

## 2020-10-21 MED ORDER — CLINDAMYCIN PHOSPHATE 900 MG/50ML IV SOLN: 900.0000 mg | INTRAVENOUS | Status: DC

## 2020-10-21 MED ORDER — FENTANYL CITRATE (PF) 100 MCG/2ML IJ SOLN
INTRAMUSCULAR | Status: AC
  Filled 2020-10-21: qty 2

## 2020-10-21 MED ORDER — PROMETHAZINE HCL 25 MG/ML IJ SOLN: 6.2500 mg | INTRAMUSCULAR | Status: DC | PRN

## 2020-10-21 MED ORDER — BUPIVACAINE HCL (PF) 0.25 % IJ SOLN
INTRAMUSCULAR | Status: DC | PRN
  Administered 2020-10-21: 9 mL

## 2020-10-21 MED ORDER — PROPOFOL 500 MG/50ML IV EMUL
INTRAVENOUS | Status: DC | PRN
  Administered 2020-10-21: 75 ug/kg/min via INTRAVENOUS

## 2020-10-21 MED ORDER — LIDOCAINE HCL (PF) 0.5 % IJ SOLN
INTRAMUSCULAR | Status: DC | PRN
  Administered 2020-10-21: 50 mL via INTRAVENOUS

## 2020-10-21 MED ORDER — FENTANYL CITRATE (PF) 100 MCG/2ML IJ SOLN
INTRAMUSCULAR | Status: DC | PRN
  Administered 2020-10-21: 50 ug via INTRAVENOUS

## 2020-10-21 MED ORDER — ONDANSETRON HCL 4 MG/2ML IJ SOLN
INTRAMUSCULAR | Status: DC | PRN
  Administered 2020-10-21: 4 mg via INTRAVENOUS

## 2020-10-21 MED ORDER — CLINDAMYCIN PHOSPHATE 900 MG/50ML IV SOLN
INTRAVENOUS | Status: DC | PRN
  Administered 2020-10-21: 900 mg via INTRAVENOUS

## 2020-10-21 MED ORDER — OXYCODONE HCL 5 MG/5ML PO SOLN: 5.0000 mg | Freq: Once | ORAL | Status: DC | PRN

## 2020-10-21 MED ORDER — HYDROMORPHONE HCL 1 MG/ML IJ SOLN
0.2500 mg | INTRAMUSCULAR | Status: DC | PRN
Start: 2020-10-21 — End: 2020-10-21

## 2020-10-21 MED ORDER — CLINDAMYCIN PHOSPHATE 900 MG/50ML IV SOLN
INTRAVENOUS | Status: AC
  Filled 2020-10-21: qty 50

## 2020-10-21 MED ORDER — ONDANSETRON HCL 4 MG/2ML IJ SOLN
INTRAMUSCULAR | Status: AC
  Filled 2020-10-21: qty 2

## 2020-10-21 SURGICAL SUPPLY — 46 items
APL PRP STRL LF DISP 70% ISPRP (MISCELLANEOUS) ×1
BLADE MINI RND TIP GREEN BEAV (BLADE) IMPLANT
BLADE OSC/SAG .038X5.5 CUT EDG (BLADE) IMPLANT
BLADE SURG 15 STRL LF DISP TIS (BLADE) ×1 IMPLANT
BLADE SURG 15 STRL SS (BLADE) ×3
BNDG CMPR 9X4 STRL LF SNTH (GAUZE/BANDAGES/DRESSINGS) ×1
BNDG COHESIVE 1X5 TAN STRL LF (GAUZE/BANDAGES/DRESSINGS) ×3 IMPLANT
BNDG ESMARK 4X9 LF (GAUZE/BANDAGES/DRESSINGS) ×3 IMPLANT
CHLORAPREP W/TINT 26 (MISCELLANEOUS) ×3 IMPLANT
CORD BIPOLAR FORCEPS 12FT (ELECTRODE) ×3 IMPLANT
COVER BACK TABLE 60X90IN (DRAPES) ×3 IMPLANT
COVER MAYO STAND STRL (DRAPES) ×3 IMPLANT
COVER WAND RF STERILE (DRAPES) IMPLANT
CUFF TOURN SGL QUICK 18X4 (TOURNIQUET CUFF) ×3 IMPLANT
DECANTER SPIKE VIAL GLASS SM (MISCELLANEOUS) IMPLANT
DRAIN PENROSE 1/4X12 LTX STRL (WOUND CARE) ×3 IMPLANT
DRAPE EXTREMITY T 121X128X90 (DISPOSABLE) ×3 IMPLANT
DRAPE OEC MINIVIEW 54X84 (DRAPES) IMPLANT
DRAPE SURG 17X23 STRL (DRAPES) ×3 IMPLANT
GAUZE SPONGE 4X4 12PLY STRL (GAUZE/BANDAGES/DRESSINGS) ×3 IMPLANT
GAUZE XEROFORM 1X8 LF (GAUZE/BANDAGES/DRESSINGS) ×3 IMPLANT
GLOVE BIOGEL PI IND STRL 8.5 (GLOVE) ×1 IMPLANT
GLOVE BIOGEL PI INDICATOR 8.5 (GLOVE) ×2
GLOVE SURG ORTHO 8.0 STRL STRW (GLOVE) ×3 IMPLANT
GLOVE SURG UNDER POLY LF SZ7 (GLOVE) ×3 IMPLANT
GOWN STRL REUS W/ TWL LRG LVL3 (GOWN DISPOSABLE) ×1 IMPLANT
GOWN STRL REUS W/TWL LRG LVL3 (GOWN DISPOSABLE) ×3
GOWN STRL REUS W/TWL XL LVL3 (GOWN DISPOSABLE) ×3 IMPLANT
NEEDLE PRECISIONGLIDE 27X1.5 (NEEDLE) ×3 IMPLANT
NS IRRIG 1000ML POUR BTL (IV SOLUTION) ×3 IMPLANT
PACK BASIN DAY SURGERY FS (CUSTOM PROCEDURE TRAY) ×3 IMPLANT
PADDING CAST ABS 4INX4YD NS (CAST SUPPLIES)
PADDING CAST ABS COTTON 4X4 ST (CAST SUPPLIES) IMPLANT
SPLINT FINGER 3.25 911903 (SOFTGOODS) ×3 IMPLANT
SPLINT FINGER 4.25 BULB 911906 (SOFTGOODS) IMPLANT
STOCKINETTE 4X48 STRL (DRAPES) ×3 IMPLANT
SUT CHROMIC 4 0 P 3 18 (SUTURE) IMPLANT
SUT ETHILON 4 0 PS 2 18 (SUTURE) ×3 IMPLANT
SUT MERSILENE 4 0 P 3 (SUTURE) IMPLANT
SUT VIC AB 4-0 P-3 18XBRD (SUTURE) IMPLANT
SUT VIC AB 4-0 P3 18 (SUTURE)
SWAB CULTURE ESWAB REG 1ML (MISCELLANEOUS) ×3 IMPLANT
SYR BULB EAR ULCER 3OZ GRN STR (SYRINGE) ×3 IMPLANT
SYR CONTROL 10ML LL (SYRINGE) ×3 IMPLANT
TOWEL GREEN STERILE FF (TOWEL DISPOSABLE) ×3 IMPLANT
UNDERPAD 30X36 HEAVY ABSORB (UNDERPADS AND DIAPERS) ×3 IMPLANT

## 2020-10-21 NOTE — Anesthesia Postprocedure Evaluation (Signed)
Anesthesia Post Note  Patient: Alyssa Rosales  Procedure(s) Performed: AMPUTATION RIGHT MIDDLE FINGER (Right Finger)     Patient location during evaluation: PACU Anesthesia Type: Bier Block Level of consciousness: awake and alert Pain management: pain level controlled Vital Signs Assessment: post-procedure vital signs reviewed and stable Respiratory status: spontaneous breathing, nonlabored ventilation and respiratory function stable Cardiovascular status: blood pressure returned to baseline and stable Postop Assessment: no apparent nausea or vomiting Anesthetic complications: no   No complications documented.  Last Vitals:  Vitals:   10/21/20 1155 10/21/20 1215  BP:  (!) 152/94  Pulse:  (!) 52  Resp:  16  Temp:  (!) 36.4 C  SpO2: 95% 96%    Last Pain:  Vitals:   10/21/20 1215  TempSrc:   PainSc: 0-No pain                 Lowella Curb

## 2020-10-21 NOTE — Anesthesia Procedure Notes (Signed)
Anesthesia Regional Block: Bier block (IV Regional)   Pre-Anesthetic Checklist: ,, timeout performed, Correct Patient, Correct Site, Correct Laterality, Correct Procedure, Correct Position, site marked, Risks and benefits discussed,  Surgical consent,  Pre-op evaluation,  At surgeon's request and post-op pain management  Laterality: Right  Prep: alcohol swabs       Needles:       Needle Gauge: 22     Additional Needles:   Narrative:  CRNA: Burna Cash, CRNA

## 2020-10-21 NOTE — Brief Op Note (Signed)
10/21/2020  11:31 AM  PATIENT:  Alyssa Rosales  69 y.o. female  PRE-OPERATIVE DIAGNOSIS:  CRUSH RIGHT MIDDLE FINGER  POST-OPERATIVE DIAGNOSIS:  CRUSH RIGHT MIDDLE FINGER  PROCEDURE:  Procedure(s) with comments: AMPUTATION RIGHT MIDDLE FINGER (Right) - IV REGIONAL FOREARM BLOCK, BIER BLOCK  SURGEON:  Surgeon(s) and Role:    * Cindee Salt, MD - Primary  PHYSICIAN ASSISTANT:   ASSISTANTS: none   ANESTHESIA:   local, regional and IV sedation  EBL: 24ml  BLOOD ADMINISTERED:none  DRAINS: none   LOCAL MEDICATIONS USED:  BUPIVICAINE   SPECIMEN:  Excision  DISPOSITION OF SPECIMEN:  PATHOLOGY  COUNTS:  YES  TOURNIQUET:   Total Tourniquet Time Documented: Forearm (Right) - 27 minutes Total: Forearm (Right) - 27 minutes   DICTATION: .Reubin Milan Dictation  PLAN OF CARE: Discharge to home after PACU  PATIENT DISPOSITION:  PACU - hemodynamically stable.

## 2020-10-21 NOTE — H&P (Signed)
Alyssa Rosales is an 69 y.o. female.   Chief Complaint: amputation right middle finger HPI: Alyssa Rosales is a 69 yo female with a  crush injury right middle finger. She was last seen on 03/21/2019. She sustained a crush injury in 2017. She has had problems with the finger since that time. She has had continued problems since her last visit. She states it is never fully healed. She is not having any drainage at the present time. 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 She is complaining of an aching pain which keeps her awake at night. She has no no history of injury. She has not had any fevers or chills. She has no history of diabetes thyroid problems arthritis or gout.    Past Medical History:  Diagnosis Date  . Abdominal aortic aneurysm (AAA) without rupture (HCC) 09/01/2017  . Coronary artery disease involving native coronary artery of native heart without angina pectoris 02/09/2017  . Edema, lower extremity   . GERD without esophagitis 01/07/2016   Last Assessment & Plan:  Continue with the PPI and have refilled this for her  . History of kidney problems   . Hyperlipidemia 01/27/2017   Overview:  Added automatically from request for surgery [phone]  . Hypertension   . Joint pain   . Kidney stone   . Moderate persistent asthma without complication 08/10/2016  . OSA (obstructive sleep apnea) 09/30/2016   Follows with pulmonary is seen 12/19 for FU bipap 21/15  . Shortness of breath   . Vitamin D deficiency     Past Surgical History:  Procedure Laterality Date  . CORONARY ANGIOPLASTY    . kidney stone removal    . RECTAL SURGERY    . spur removal    . TOOTH EXTRACTION      Family History  Problem Relation Age of Onset  . Alzheimer's disease Mother   . Hyperlipidemia Mother   . Hypertension Mother   . Diabetes Mother   . Hypertension Father   . Hyperlipidemia Father   . Breast cancer Neg Hx    Social History:  reports that she  has never smoked. She has never used smokeless tobacco. She reports that she does not drink alcohol and does not use drugs.  Allergies:  Allergies  Allergen Reactions  . Amoxicillin-Pot Clavulanate Diarrhea and Nausea And Vomiting  . Penicillins Diarrhea  . Propoxyphene Rash    Other reaction(s): Other (See Comments) violent   . Sulfamethoxazole Rash    No medications prior to admission.    No results found for this or any previous visit (from the past 48 hour(s)).  No results found.   Pertinent items are noted in HPI.  Height 4\' 10"  (1.473 m), weight 93.9 kg.  General appearance: alert, cooperative and appears stated age Head: Normocephalic, without obvious abnormality Neck: no JVD Resp: clear to auscultation bilaterally Cardio: regular rate and rhythm, S1, S2 normal, no murmur, click, rub or gallop GI: soft, non-tender; bowel sounds normal; no masses,  no organomegaly Extremities: extremities normal, atraumatic, no cyanosis or edema Pulses: 2+ and symmetric Skin: Skin color, texture, turgor normal. No rashes or lesions Neurologic: Grossly normal Incision/Wound: granularting tip middle finger  Assessment/Plan Assessment:  1. Crush injury to finger   Plan: She would like to have this revised. We will schedule for amputation through the distal inner phalangeal joint of her right middle finger. She is aware that there is no guarantee that  surgery possibility of infection recurrence injury to arteries nerves tendons. She is advised that cultures will be taken to be certain she does not have a unusual infection. This to be scheduled in outpatient under regional anesthesia.    Cindee Salt 10/21/2020, 5:37 AM

## 2020-10-21 NOTE — Discharge Instructions (Addendum)

## 2020-10-21 NOTE — Op Note (Signed)
NAME: Alyssa Rosales MEDICAL RECORD NO: [id number] DATE OF BIRTH: [birth year] FACILITY: [facility] LOCATION: [facility] PHYSICIAN: Nicki Reaper, MD   OPERATIVE REPORT   DATE OF PROCEDURE: 10/21/20    PREOPERATIVE DIAGNOSIS:   Status post crush injury right middle finger with open area   POSTOPERATIVE DIAGNOSIS:   Same   PROCEDURE:   Revision amputation right middle finger with   SURGEON: Cindee Salt, M.D.   ASSISTANT: none   ANESTHESIA:  Bier block with sedation and Local   INTRAVENOUS FLUIDS:  Per anesthesia flow sheet.   ESTIMATED BLOOD LOSS:  Minimal.   COMPLICATIONS:  None.   SPECIMENS:   Fingertip   TOURNIQUET TIME:    Total Tourniquet Time Documented: Forearm (Right) - 27 minutes Total: Forearm (Right) - 27 minutes    DISPOSITION:  Stable to PACU.   INDICATIONS: Patient is a 69 year old female with a a crush injury to her right middle finger.  This was approximately 5 years ago.  This went on to heal but has broken down with a nonhealing area of granulation tissue.  She is desirous of revision.  She is admitted for biopsy to be certain that this is potential malignancy or infectious process pre-peripost course but discussed along with risk complications.  She is aware there is no guarantee to the surgery the possibility of infection recurrence injury to arteries nerves tendons incomplete relief symptoms dystrophy. Preoperative the patient is seen the extremity marked by both patient and surgeon antibiotic given OPERATIVE COURSE: Patient is brought to the operating room where form based IV regional anesthetic was carried out without difficulty under the direction of the anesthesia department.  She was prepped using ChloraPrep in the supine position with the right arm free.  A 3-minute dry time was allowed timeout taken to confirm patient procedure.  A metacarpal block was given quarter percent bupivacaine without epinephrine approximately 8 cc 9 cc was  used.  A Penrose drain was added for tourniquet control at the base of the right middle finger.  The distal mass was elliptically excised down into subcutaneous tissue.  Blunt sharp dissection allowed dissection free of any abnormal looking tissue.  Cultures were taken for both aerobic and aerobic anaerobic fungal and AFB.  Dissection was carried down through the flexor tendon.  Bleeders were electrocauterized with bipolar the extensor tendon was cut and dissection carried through the distal phalangeal joint.  A rondure was then used to remove the cartilage condyles of the middle phalanx.  The wound was copious irrigated with saline skin was then closed with interrupted 4-0 nylon sutures.  A sterile compressive dressing and splint to the finger was applied.  I deflation the tourniquet remaining fingers pink.  She was taken to the recovery room for observation in satisfactory condition.  Will be discharged home to return to the [facility] center [facility] in 1 week on Tylenol ibuprofen for pain with Ultram for breakthrough.   Cindee Salt, MD Electronically signed, 10/21/20

## 2020-10-21 NOTE — Anesthesia Preprocedure Evaluation (Signed)
Anesthesia Evaluation  Patient identified by MRN, date of birth, ID band Patient awake    Reviewed: Allergy & Precautions, H&P , NPO status , Patient's Chart, lab work & pertinent test results  Airway Mallampati: II  TM Distance: >3 FB Neck ROM: Full    Dental no notable dental hx.    Pulmonary asthma , sleep apnea ,    Pulmonary exam normal breath sounds clear to auscultation       Cardiovascular hypertension, Pt. on medications + CAD  Normal cardiovascular exam Rhythm:Regular Rate:Normal     Neuro/Psych negative neurological ROS  negative psych ROS   GI/Hepatic Neg liver ROS, GERD  ,  Endo/Other  Morbid obesity  Renal/GU negative Renal ROS  negative genitourinary   Musculoskeletal  (+) Arthritis , Osteoarthritis,    Abdominal (+) + obese,   Peds negative pediatric ROS (+)  Hematology negative hematology ROS (+)   Anesthesia Other Findings   Reproductive/Obstetrics negative OB ROS                             Anesthesia Physical Anesthesia Plan  ASA: III  Anesthesia Plan: Bier Block and Bier Block-LIDOCAINE ONLY   Post-op Pain Management:    Induction: Intravenous  PONV Risk Score and Plan: 2 and Ondansetron, Midazolam and Treatment may vary due to age or medical condition  Airway Management Planned: Simple Face Mask  Additional Equipment:   Intra-op Plan:   Post-operative Plan:   Informed Consent: I have reviewed the patients History and Physical, chart, labs and discussed the procedure including the risks, benefits and alternatives for the proposed anesthesia with the patient or authorized representative who has indicated his/her understanding and acceptance.     Dental advisory given  Plan Discussed with: CRNA  Anesthesia Plan Comments:         Anesthesia Quick Evaluation

## 2020-10-21 NOTE — Transfer of Care (Signed)
Immediate Anesthesia Transfer of Care Note  Patient: Alyssa Rosales  Procedure(s) Performed: AMPUTATION RIGHT MIDDLE FINGER (Right Finger)  Patient Location: PACU  Anesthesia Type:MAC and Bier block  Level of Consciousness: awake, alert  and oriented  Airway & Oxygen Therapy: Patient Spontanous Breathing and Patient connected to face mask oxygen  Post-op Assessment: Report given to RN and Post -op Vital signs reviewed and stable  Post vital signs: Reviewed and stable  Last Vitals:  Vitals Value Taken Time  BP 149/83 10/21/20 1145  Temp 36.4 C 10/21/20 1133  Pulse 58 10/21/20 1146  Resp 17 10/21/20 1146  SpO2 95 % 10/21/20 1155  Vitals shown include unvalidated device data.  Last Pain:  Vitals:   10/21/20 1155  TempSrc:   PainSc: 0-No pain      Patients Stated Pain Goal: 4 (86/76/19 5093)  Complications: No complications documented.

## 2020-10-22 ENCOUNTER — Encounter (HOSPITAL_BASED_OUTPATIENT_CLINIC_OR_DEPARTMENT_OTHER): Payer: Self-pay | Admitting: Orthopedic Surgery

## 2020-10-22 NOTE — Progress Notes (Signed)
Very satisfied with care rec'd at [facility]. Denies pain or other problems, F/U with Dr. Merlyn Lot already scheduled.

## 2020-10-23 LAB — SURGICAL PATHOLOGY

## 2020-10-26 LAB — AEROBIC/ANAEROBIC CULTURE W GRAM STAIN (SURGICAL/DEEP WOUND): Gram Stain: NONE SEEN

## 2020-11-14 ENCOUNTER — Other Ambulatory Visit: Payer: Self-pay | Admitting: Orthopedic Surgery

## 2020-11-14 ENCOUNTER — Encounter (HOSPITAL_BASED_OUTPATIENT_CLINIC_OR_DEPARTMENT_OTHER): Payer: Self-pay | Admitting: Orthopedic Surgery

## 2020-11-14 ENCOUNTER — Ambulatory Visit (HOSPITAL_BASED_OUTPATIENT_CLINIC_OR_DEPARTMENT_OTHER): Payer: Medicare Other | Admitting: Anesthesiology

## 2020-11-14 ENCOUNTER — Ambulatory Visit (HOSPITAL_BASED_OUTPATIENT_CLINIC_OR_DEPARTMENT_OTHER)
Admission: RE | Admit: 2020-11-14 | Discharge: 2020-11-14 | Disposition: A | Discharge status: Home / Self Care | Payer: Medicare Other | Source: Ambulatory Visit | Attending: Orthopedic Surgery | Admitting: Orthopedic Surgery

## 2020-11-14 ENCOUNTER — Other Ambulatory Visit: Payer: Self-pay

## 2020-11-14 ENCOUNTER — Encounter (HOSPITAL_BASED_OUTPATIENT_CLINIC_OR_DEPARTMENT_OTHER)
Admission: RE | Disposition: A | Discharge status: Home / Self Care | Payer: Self-pay | Source: Ambulatory Visit | Attending: Orthopedic Surgery

## 2020-11-14 DIAGNOSIS — M65141 Other infective (teno)synovitis, right hand: Secondary | ICD-10-CM | POA: Diagnosis not present

## 2020-11-14 DIAGNOSIS — Z89021 Acquired absence of right finger(s): Secondary | ICD-10-CM | POA: Insufficient documentation

## 2020-11-14 DIAGNOSIS — Z9861 Coronary angioplasty status: Secondary | ICD-10-CM | POA: Insufficient documentation

## 2020-11-14 DIAGNOSIS — Z20822 Contact with and (suspected) exposure to covid-19: Secondary | ICD-10-CM | POA: Insufficient documentation

## 2020-11-14 DIAGNOSIS — T8149XA Infection following a procedure, other surgical site, initial encounter: Secondary | ICD-10-CM | POA: Diagnosis not present

## 2020-11-14 DIAGNOSIS — Z79899 Other long term (current) drug therapy: Secondary | ICD-10-CM | POA: Insufficient documentation

## 2020-11-14 DIAGNOSIS — Y835 Amputation of limb(s) as the cause of abnormal reaction of the patient, or of later complication, without mention of misadventure at the time of the procedure: Secondary | ICD-10-CM | POA: Diagnosis not present

## 2020-11-14 HISTORY — PX: INCISION AND DRAINAGE: SHX5863

## 2020-11-14 LAB — SARS CORONAVIRUS 2 BY RT PCR (HOSPITAL ORDER, PERFORMED IN ~~LOC~~ HOSPITAL LAB): SARS Coronavirus 2: NEGATIVE

## 2020-11-14 SURGERY — INCISION AND DRAINAGE
Anesthesia: Regional | Laterality: Right

## 2020-11-14 MED ORDER — ONDANSETRON HCL 4 MG/2ML IJ SOLN
INTRAMUSCULAR | Status: AC
  Filled 2020-11-14: qty 2

## 2020-11-14 MED ORDER — ONDANSETRON HCL 4 MG/2ML IJ SOLN
INTRAMUSCULAR | Status: DC | PRN
  Administered 2020-11-14: 4 mg via INTRAVENOUS

## 2020-11-14 MED ORDER — PROPOFOL 500 MG/50ML IV EMUL
INTRAVENOUS | Status: DC | PRN
  Administered 2020-11-14: 100 ug/kg/min via INTRAVENOUS

## 2020-11-14 MED ORDER — LIDOCAINE HCL (PF) 0.5 % IJ SOLN
INTRAMUSCULAR | Status: DC | PRN
  Administered 2020-11-14: 30 mL via INTRAVENOUS

## 2020-11-14 MED ORDER — PROPOFOL 10 MG/ML IV BOLUS
INTRAVENOUS | Status: DC | PRN
  Administered 2020-11-14 (×2): 20 mg via INTRAVENOUS

## 2020-11-14 MED ORDER — ACETAMINOPHEN 500 MG PO TABS: 1000.0000 mg | ORAL_TABLET | Freq: Once | ORAL | Status: DC

## 2020-11-14 MED ORDER — PROPOFOL 10 MG/ML IV BOLUS
INTRAVENOUS | Status: AC
  Filled 2020-11-14: qty 40

## 2020-11-14 MED ORDER — LIDOCAINE 2% (20 MG/ML) 5 ML SYRINGE
INTRAMUSCULAR | Status: AC
  Filled 2020-11-14: qty 5

## 2020-11-14 MED ORDER — LIDOCAINE 2% (20 MG/ML) 5 ML SYRINGE
INTRAMUSCULAR | Status: DC | PRN
  Administered 2020-11-14: 40 mg via INTRAVENOUS

## 2020-11-14 MED ORDER — BUPIVACAINE HCL 0.25 % IJ SOLN
INTRAMUSCULAR | Status: DC | PRN
  Administered 2020-11-14: 5 mL

## 2020-11-14 MED ORDER — TRAMADOL HCL 50 MG PO TABS: 50.0000 mg | ORAL_TABLET | Freq: Four times a day (QID) | ORAL | 0 refills | Status: DC | PRN

## 2020-11-14 MED ORDER — MIDAZOLAM HCL 5 MG/5ML IJ SOLN
INTRAMUSCULAR | Status: DC | PRN
  Administered 2020-11-14: 2 mg via INTRAVENOUS

## 2020-11-14 MED ORDER — MIDAZOLAM HCL 2 MG/2ML IJ SOLN
INTRAMUSCULAR | Status: AC
  Filled 2020-11-14: qty 2

## 2020-11-14 MED ORDER — BUPIVACAINE HCL (PF) 0.25 % IJ SOLN: INTRAMUSCULAR | Status: DC | PRN

## 2020-11-14 MED ORDER — LACTATED RINGERS IV SOLN: INTRAVENOUS | Status: DC | PRN

## 2020-11-14 SURGICAL SUPPLY — 49 items
APL PRP STRL LF DISP 70% ISPRP (MISCELLANEOUS)
BAG DECANTER FOR FLEXI CONT (MISCELLANEOUS) IMPLANT
BLADE MINI RND TIP GREEN BEAV (BLADE) IMPLANT
BLADE SURG 15 STRL LF DISP TIS (BLADE) ×1 IMPLANT
BLADE SURG 15 STRL SS (BLADE) ×2
BNDG CMPR 9X4 STRL LF SNTH (GAUZE/BANDAGES/DRESSINGS) ×1
BNDG COHESIVE 1X5 TAN STRL LF (GAUZE/BANDAGES/DRESSINGS) IMPLANT
BNDG COHESIVE 3X5 TAN STRL LF (GAUZE/BANDAGES/DRESSINGS) IMPLANT
BNDG COHESIVE 4X5 TAN STRL (GAUZE/BANDAGES/DRESSINGS) ×2 IMPLANT
BNDG ESMARK 4X9 LF (GAUZE/BANDAGES/DRESSINGS) ×2 IMPLANT
BNDG GAUZE ELAST 4 BULKY (GAUZE/BANDAGES/DRESSINGS) IMPLANT
CHLORAPREP W/TINT 26 (MISCELLANEOUS) IMPLANT
CORD BIPOLAR FORCEPS 12FT (ELECTRODE) ×2 IMPLANT
COVER BACK TABLE 60X90IN (DRAPES) ×2 IMPLANT
COVER MAYO STAND STRL (DRAPES) ×2 IMPLANT
COVER WAND RF STERILE (DRAPES) IMPLANT
CUFF TOURN SGL QUICK 18X4 (TOURNIQUET CUFF) ×2 IMPLANT
DRAPE EXTREMITY T 121X128X90 (DISPOSABLE) ×2 IMPLANT
DRAPE SURG 17X23 STRL (DRAPES) ×2 IMPLANT
GAUZE PACKING IODOFORM 1/4X15 (PACKING) ×2 IMPLANT
GAUZE PACKING IODOFORM 1/4X5 (PACKING) ×2 IMPLANT
GAUZE SPONGE 4X4 12PLY STRL (GAUZE/BANDAGES/DRESSINGS) ×2 IMPLANT
GAUZE XEROFORM 1X8 LF (GAUZE/BANDAGES/DRESSINGS) ×2 IMPLANT
GLOVE BIOGEL PI IND STRL 8.5 (GLOVE) ×1 IMPLANT
GLOVE BIOGEL PI INDICATOR 8.5 (GLOVE) ×1
GLOVE SURG ORTHO 8.0 STRL STRW (GLOVE) ×2 IMPLANT
GOWN STRL REUS W/ TWL LRG LVL3 (GOWN DISPOSABLE) ×1 IMPLANT
GOWN STRL REUS W/TWL LRG LVL3 (GOWN DISPOSABLE) ×2
GOWN STRL REUS W/TWL XL LVL3 (GOWN DISPOSABLE) ×2 IMPLANT
LOOP VESSEL MAXI BLUE (MISCELLANEOUS) IMPLANT
NEEDLE PRECISIONGLIDE 27X1.5 (NEEDLE) ×2 IMPLANT
NS IRRIG 1000ML POUR BTL (IV SOLUTION) ×2 IMPLANT
PACK BASIN DAY SURGERY FS (CUSTOM PROCEDURE TRAY) ×2 IMPLANT
PAD CAST 3X4 CTTN HI CHSV (CAST SUPPLIES) IMPLANT
PADDING CAST ABS 4INX4YD NS (CAST SUPPLIES) ×1
PADDING CAST ABS COTTON 4X4 ST (CAST SUPPLIES) ×1 IMPLANT
PADDING CAST COTTON 3X4 STRL (CAST SUPPLIES)
SPLINT PLASTER CAST XFAST 3X15 (CAST SUPPLIES) IMPLANT
SPLINT PLASTER XTRA FASTSET 3X (CAST SUPPLIES)
STOCKINETTE 4X48 STRL (DRAPES) ×2 IMPLANT
SUT ETHILON 4 0 PS 2 18 (SUTURE) ×2 IMPLANT
SWAB COLLECTION DEVICE MRSA (MISCELLANEOUS) ×2 IMPLANT
SWAB CULTURE ESWAB REG 1ML (MISCELLANEOUS) ×2 IMPLANT
SYR BULB EAR ULCER 3OZ GRN STR (SYRINGE) ×2 IMPLANT
SYR CONTROL 10ML LL (SYRINGE) ×2 IMPLANT
SYR TOOMEY 50ML (SYRINGE) IMPLANT
TOWEL GREEN STERILE FF (TOWEL DISPOSABLE) ×2 IMPLANT
TUBE FEEDING ENTERAL 5FR 16IN (TUBING) ×2 IMPLANT
UNDERPAD 30X36 HEAVY ABSORB (UNDERPADS AND DIAPERS) ×2 IMPLANT

## 2020-11-14 NOTE — Anesthesia Postprocedure Evaluation (Signed)
Anesthesia Post Note  Patient: Alyssa Rosales  Procedure(s) Performed: INCISION AND DRAINAGE RIGHT MIDDLE FINGER (Right )     Patient location during evaluation: PACU Anesthesia Type: Bier Block Level of consciousness: awake and alert Pain management: pain level controlled Vital Signs Assessment: post-procedure vital signs reviewed and stable Respiratory status: spontaneous breathing and respiratory function stable Cardiovascular status: stable Postop Assessment: no apparent nausea or vomiting Anesthetic complications: no   No complications documented.  Last Vitals:  Vitals:   11/14/20 1430 11/14/20 1445  BP: 129/86 (!) 148/99  Pulse: 74 68  Resp: 16 16  Temp:  36.7 C  SpO2: 98% 98%    Last Pain:  Vitals:   11/14/20 1445  TempSrc:   PainSc: 0-No pain                 [name] DANIEL

## 2020-11-14 NOTE — Discharge Instructions (Addendum)

## 2020-11-14 NOTE — Anesthesia Preprocedure Evaluation (Addendum)
Anesthesia Evaluation  Patient identified by MRN, date of birth, ID band Patient awake    Reviewed: Allergy & Precautions, H&P , NPO status , Patient's Chart, lab work & pertinent test results  History of Anesthesia Complications Negative for: history of anesthetic complications  Airway Mallampati: II  TM Distance: >3 FB Neck ROM: Full    Dental no notable dental hx.    Pulmonary asthma , sleep apnea ,    Pulmonary exam normal        Cardiovascular hypertension, Pt. on medications + CAD  Normal cardiovascular exam   Nuclear stress EF: 57%.  The left ventricular ejection fraction is normal (55-65%).  There was no ST segment deviation noted during stress.  This is a low risk study.  No ischemia, no MI, Normal EF.     Neuro/Psych negative neurological ROS  negative psych ROS   GI/Hepatic Neg liver ROS, GERD  ,  Endo/Other  Morbid obesity  Renal/GU negative Renal ROS  negative genitourinary   Musculoskeletal  (+) Arthritis , Osteoarthritis,    Abdominal (+) + obese,   Peds negative pediatric ROS (+)  Hematology negative hematology ROS (+)   Anesthesia Other Findings   Reproductive/Obstetrics negative OB ROS                            Anesthesia Physical  Anesthesia Plan  ASA: III  Anesthesia Plan: Bier Block and MAC and Bier Block-LIDOCAINE ONLY   Post-op Pain Management:    Induction: Intravenous  PONV Risk Score and Plan: 2 and Ondansetron, Treatment may vary due to age or medical condition and Propofol infusion  Airway Management Planned: Simple Face Mask  Additional Equipment:   Intra-op Plan:   Post-operative Plan:   Informed Consent: I have reviewed the patients History and Physical, chart, labs and discussed the procedure including the risks, benefits and alternatives for the proposed anesthesia with the patient or authorized representative who has indicated  his/her understanding and acceptance.     Dental advisory given  Plan Discussed with: Anesthesiologist, CRNA and Surgeon  Anesthesia Plan Comments:        Anesthesia Quick Evaluation

## 2020-11-14 NOTE — H&P (Signed)
Alyssa Rosales is an 70 y.o. female.   Chief Complaint: post op infection HPI: Patient is a 70 yo female post amputation right middle finger who has developed pain swelling and erythema to the finger.over the past several days. This has been slowly increasing. She was placed on doxycycline by Annye Rusk yesterday.  Past Medical History:  Diagnosis Date  . Abdominal aortic aneurysm (AAA) without rupture (HCC) 09/01/2017  . Coronary artery disease involving native coronary artery of native heart without angina pectoris 02/09/2017  . Edema, lower extremity   . GERD without esophagitis 01/07/2016   Last Assessment & Plan:  Continue with the PPI and have refilled this for her  . History of kidney problems   . Hyperlipidemia 01/27/2017   Overview:  Added automatically from request for surgery [phone]  . Hypertension   . Joint pain   . Kidney stone   . Moderate persistent asthma without complication 08/10/2016  . OSA (obstructive sleep apnea) 09/30/2016   Follows with pulmonary is seen 12/19 for FU bipap 21/15  . Shortness of breath   . Vitamin D deficiency     Past Surgical History:  Procedure Laterality Date  . AMPUTATION Right 10/21/2020   Procedure: AMPUTATION RIGHT MIDDLE FINGER;  Surgeon: Cindee Salt, MD;  Location: [facility];  Service: Orthopedics;  Laterality: Right;  IV REGIONAL FOREARM BLOCK, BIER BLOCK  . CORONARY ANGIOPLASTY    . kidney stone removal    . RECTAL SURGERY    . spur removal    . TOOTH EXTRACTION      Family History  Problem Relation Age of Onset  . Alzheimer's disease Mother   . Hyperlipidemia Mother   . Hypertension Mother   . Diabetes Mother   . Hypertension Father   . Hyperlipidemia Father   . Breast cancer Neg Hx    Social History:  reports that she has never smoked. She has never used smokeless tobacco. She reports that she does not drink alcohol and does not use drugs.  Allergies:  Allergies  Allergen Reactions  .  Amoxicillin-Pot Clavulanate Diarrhea and Nausea And Vomiting  . Penicillins Diarrhea  . Propoxyphene Rash    Other reaction(s): Other (See Comments) violent   . Sulfamethoxazole Rash    No medications prior to admission.    No results found for this or any previous visit (from the past 48 hour(s)).  No results found.   Pertinent items are noted in HPI.  There were no vitals taken for this visit.  General appearance: alert, cooperative and appears stated age Head: Normocephalic, without obvious abnormality Neck: no JVD Resp: clear to auscultation bilaterally Cardio: regular rate and rhythm, S1, S2 normal, no murmur, click, rub or gallop GI: soft, non-tender; bowel sounds normal; no masses,  no organomegaly Extremities: infection right middle finger Pulses: 2+ and symmetric Skin: Skin color, texture, turgor normal. No rashes or lesions Neurologic: Grossly normal Incision/Wound: healed  Assessment/Plan Infection right middle finger Plan:  I+D right middle finger.Pre peri post op course discussed. [place] next Tuesday.   Cindee Salt 11/14/2020, 12:24 PM

## 2020-11-14 NOTE — Transfer of Care (Signed)
Immediate Anesthesia Transfer of Care Note  Patient: Alyssa Rosales  Procedure(s) Performed: INCISION AND DRAINAGE RIGHT MIDDLE FINGER (Right )  Patient Location: PACU  Anesthesia Type:MAC and Bier block  Level of Consciousness: awake, alert  and oriented  Airway & Oxygen Therapy: Patient Spontanous Breathing and Patient connected to face mask oxygen  Post-op Assessment: Report given to RN and Post -op Vital signs reviewed and stable  Post vital signs: Reviewed and stable  Last Vitals:  Vitals Value Taken Time  BP 172/65   Temp    Pulse 99 11/14/20 1420  Resp 19 11/14/20 1421  SpO2 99 % 11/14/20 1420  Vitals shown include unvalidated device data.  Last Pain:  Vitals:   11/14/20 1314  TempSrc: Oral  PainSc: 8       Patients Stated Pain Goal: 1 (49/61/16 4353)  Complications: No complications documented.

## 2020-11-14 NOTE — Brief Op Note (Signed)
11/14/2020  2:17 PM  PATIENT:  Alyssa Rosales  70 y.o. female  PRE-OPERATIVE DIAGNOSIS:  INFECTION RIGHT MIDDLE FINGER  POST-OPERATIVE DIAGNOSIS:  INFECTION RIGHT MIDDLE FINGER  PROCEDURE:  Procedure(s): INCISION AND DRAINAGE RIGHT MIDDLE FINGER (Right)  SURGEON:  Surgeon(s) and Role:    * Cindee Salt, MD - Primary  PHYSICIAN ASSISTANT:   ASSISTANTS: none   ANESTHESIA:   local, regional and IV sedation  EBL:  63ml  BLOOD ADMINISTERED:none  DRAINS: packing   LOCAL MEDICATIONS USED:  BUPIVICAINE   SPECIMEN:  No Specimen  DISPOSITION OF SPECIMEN:  cultures  COUNTS:  YES  TOURNIQUET:  * Missing tourniquet times found for documented tourniquets in log: [id number] *  DICTATION: .Reubin Milan Dictation  PLAN OF CARE: Discharge to home after PACU  PATIENT DISPOSITION:  PACU - hemodynamically stable.

## 2020-11-14 NOTE — Anesthesia Procedure Notes (Signed)
Anesthesia Regional Block: Bier block (IV Regional)   Pre-Anesthetic Checklist: ,, timeout performed, Correct Patient, Correct Site, Correct Laterality, Correct Procedure, Correct Position, site marked, Risks and benefits discussed, Surgical consent,  Pre-op evaluation,  At surgeon's request  Laterality: Right  Prep: chloraprep       Needles:  Injection technique: Single-shot      Needle Gauge: 22     Additional Needles:   Procedures:,,,,, intact distal pulses, Esmarch exsanguination, single tourniquet utilized, #20gu IV placed  Narrative:  Start time: 11/14/2020 1:44 PM End time: 11/14/2020 1:46 PM  Performed by: Personally  CRNA: Pearson Grippe, CRNA

## 2020-11-14 NOTE — Op Note (Signed)
NAME: Alyssa Rosales MEDICAL RECORD NO: [id number] DATE OF BIRTH: May 15, 1951 FACILITY: [facility] LOCATION: [facility] PHYSICIAN: Nicki Reaper, MD   OPERATIVE REPORT   DATE OF PROCEDURE: 11/14/20    PREOPERATIVE DIAGNOSIS:   Infection right middle finger   POSTOPERATIVE DIAGNOSIS:   Same   PROCEDURE:   I&D right middle   SURGEON: Cindee Salt, M.D.   ASSISTANT: none   ANESTHESIA:  Bier block with sedation plus local   INTRAVENOUS FLUIDS:  Per anesthesia flow sheet.   ESTIMATED BLOOD LOSS:  Minimal.   COMPLICATIONS:  None.   SPECIMENS:   Cultures   TOURNIQUET TIME:   * Missing tourniquet times found for documented tourniquets in log: [id number] *   DISPOSITION:  Stable to PACU.   INDICATIONS: Patient is a 70 year old female underwent amputation of her right middle finger for nonhealing of an old old wound.  She she has done extremely well for the past 3 weeks her sutures are out she states over the past 2 days she has had progressive swelling of the tip of the finger.  He was seen with tenderness along the flexor sheath swelling of the distal portion of the wound.  She is admitted for incision and drainage.  Prepare postoperative course been discussed along with risks and complications.  She is aware that there is no guarantee to the surgery possibility of further infection.  The preoperative area the patient is seen extremity marked by both patient and surgeon she was placed on the oxacillin yesterday and she first called  OPERATIVE COURSE: Patient is brought to the operating room placed in supine position with the right arm free a forearm IV regional anesthetic was carried out without difficulty under the direction the anesthesia department.  A timeout was taken confirm patient procedure.  A local infiltration metacarpal block was given at the metacarpal phalangeal joint quarter spent percent bupivacaine without epinephrine proximately 7 cc was used.  Oblique  incision was made over the A1 pulley carried down through subcutaneous tissue the neurovascular structures identified protected the A1 pulley was opened and irrigation catheter was then placed this was a five [place].  This was used for irrigation and a proximal to distal direction.  A separate incision was then made over the tip through the old incision purulent material was immediately encountered central aspect.  This area was entirely open.  Dissection carried down to the flexor sheath and flexor sheath was irrigated out with saline approximately 500 cc was used for some was sent for cultures prior to the irrigation.  The two incisions were then packed open with iodoform gauze.  A sterile compressive dressing was applied.  Deflation of the tourniquet all fingers immediately pink.  She was taken to the recovery room for observation in satisfactory condition.  She will be discharged home to return to the [facility] center of [facility] in 1 week Tylenol ibuprofen for pain with Ultram Ultram for breakthrough she is presently on doxycycline.   Cindee Salt, MD Electronically signed, 11/14/20

## 2020-11-18 ENCOUNTER — Encounter (HOSPITAL_BASED_OUTPATIENT_CLINIC_OR_DEPARTMENT_OTHER): Payer: Self-pay | Admitting: Orthopedic Surgery

## 2020-11-19 LAB — AEROBIC/ANAEROBIC CULTURE W GRAM STAIN (SURGICAL/DEEP WOUND)

## 2020-12-08 ENCOUNTER — Other Ambulatory Visit: Payer: Self-pay | Admitting: Internal Medicine

## 2020-12-08 DIAGNOSIS — Z1231 Encounter for screening mammogram for malignant neoplasm of breast: Secondary | ICD-10-CM

## 2021-02-16 ENCOUNTER — Other Ambulatory Visit: Payer: Self-pay

## 2021-02-16 ENCOUNTER — Ambulatory Visit
Admission: RE | Admit: 2021-02-16 | Discharge: 2021-02-16 | Disposition: A | Discharge status: Home / Self Care | Payer: Medicare Other | Source: Ambulatory Visit | Attending: Internal Medicine | Admitting: Internal Medicine

## 2021-02-16 DIAGNOSIS — Z1231 Encounter for screening mammogram for malignant neoplasm of breast: Secondary | ICD-10-CM

## 2021-02-16 IMAGING — MG MM DIGITAL SCREENING BILAT W/ TOMO AND CAD
8 series · 8 of 24 positions shown · non-contrast
Comparison: Previous exam(s).

ACR Breast Density Category a: The breast tissue is almost entirely
fatty.

CLINICAL DATA: Screening.

EXAM:
DIGITAL SCREENING BILATERAL MAMMOGRAM WITH TOMOSYNTHESIS AND CAD
TECHNIQUE: Bilateral screening digital craniocaudal and mediolateral oblique
mammograms were obtained. Bilateral screening digital breast
tomosynthesis was performed. The images were evaluated with
computer-aided detection.

[L MLO synth-2D]
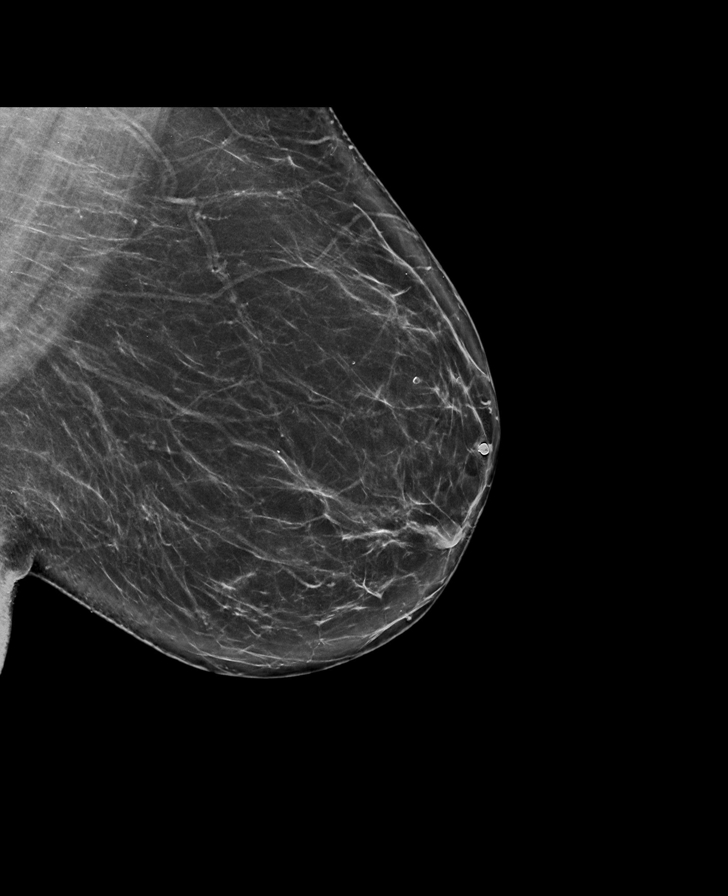

[R MLO synth-2D]
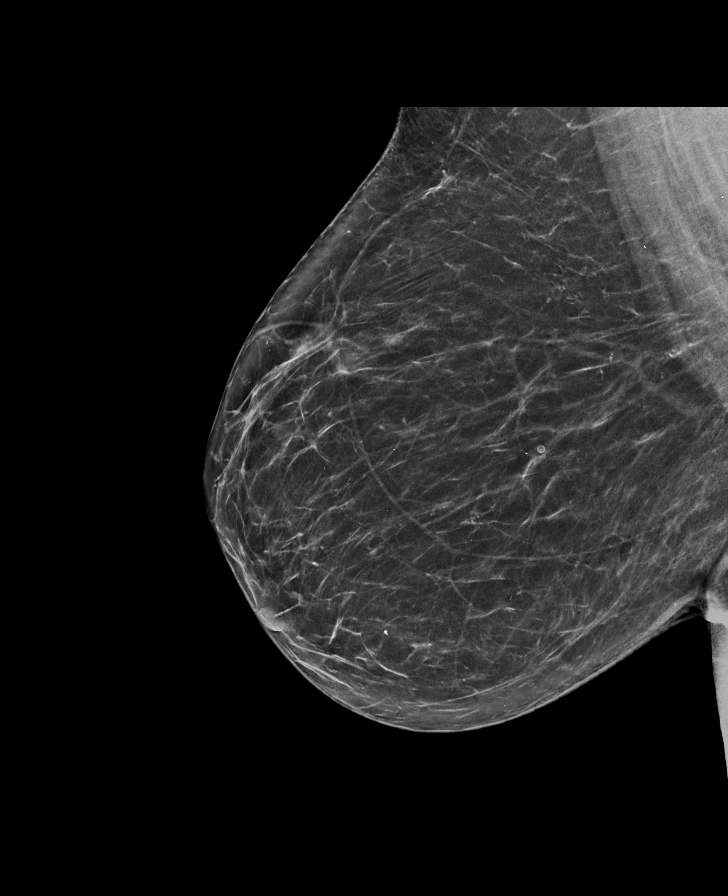

[R CC synth-2D]
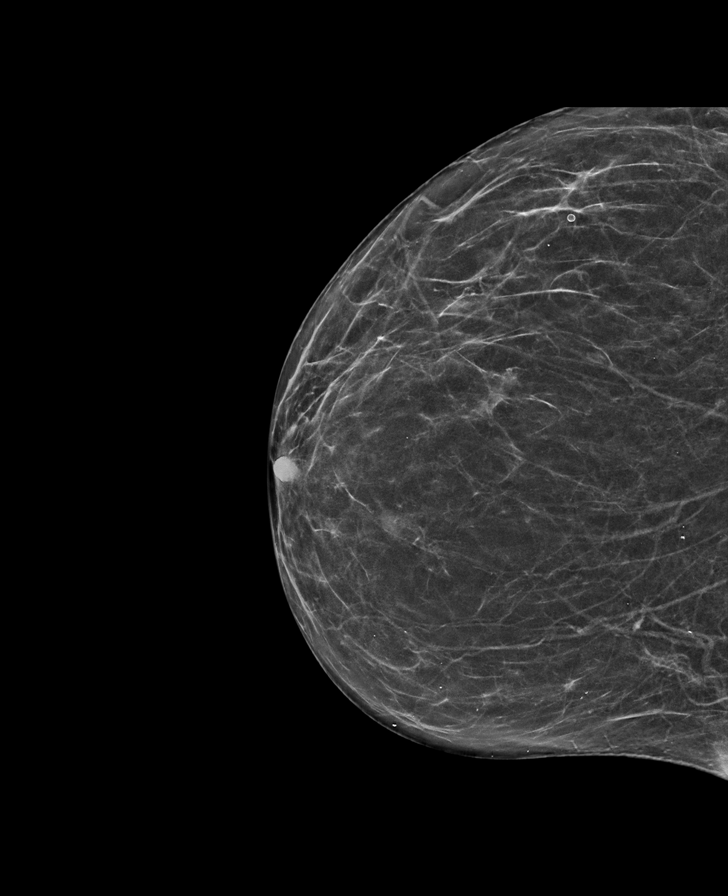

[L CC synth-2D]
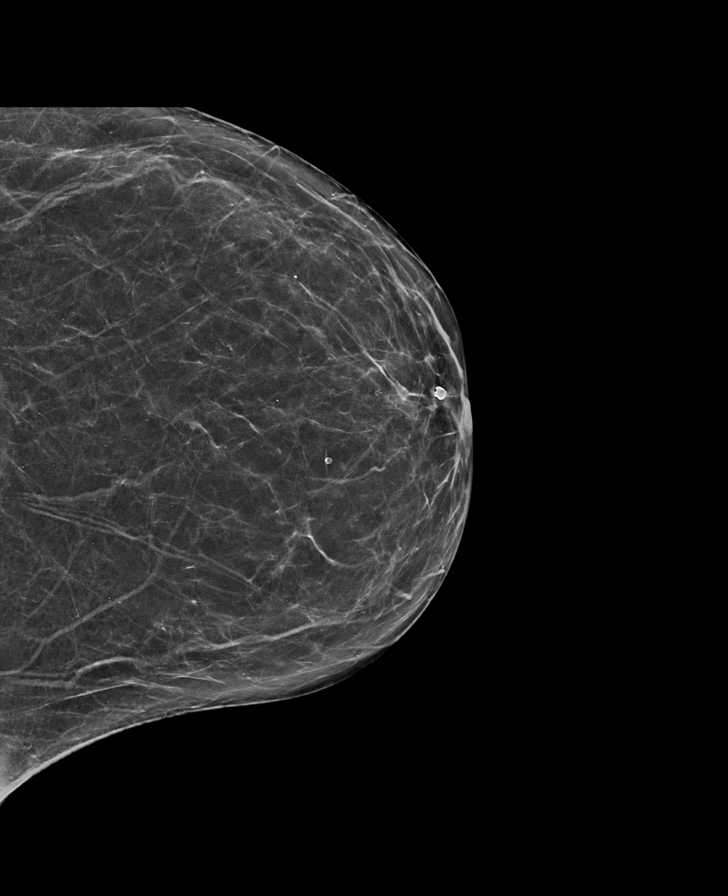

[R MLO tomo · tomo slice 41/80.0]
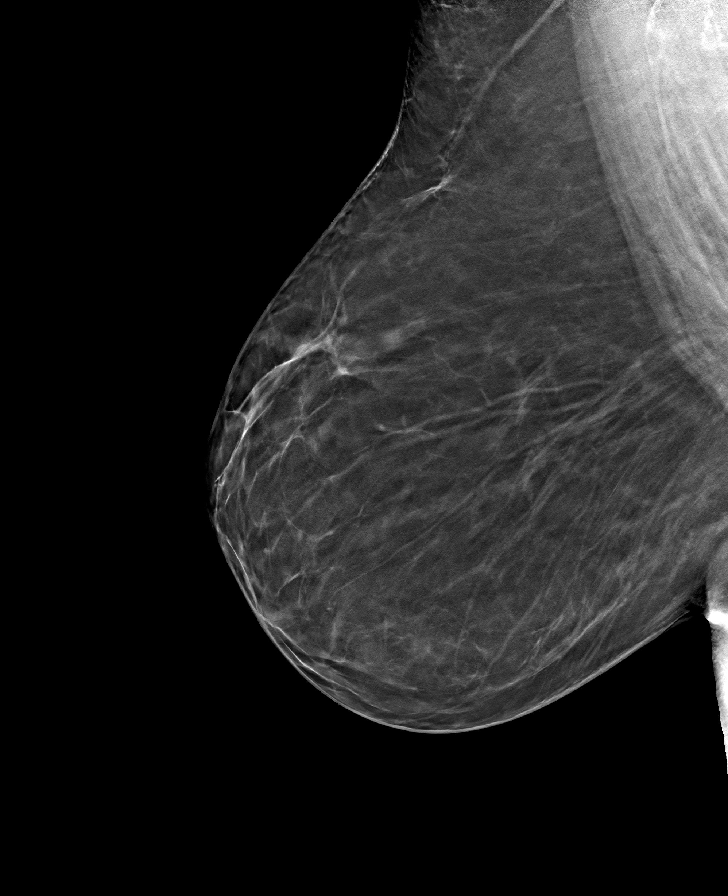

[L MLO tomo · tomo slice 42/83.0]
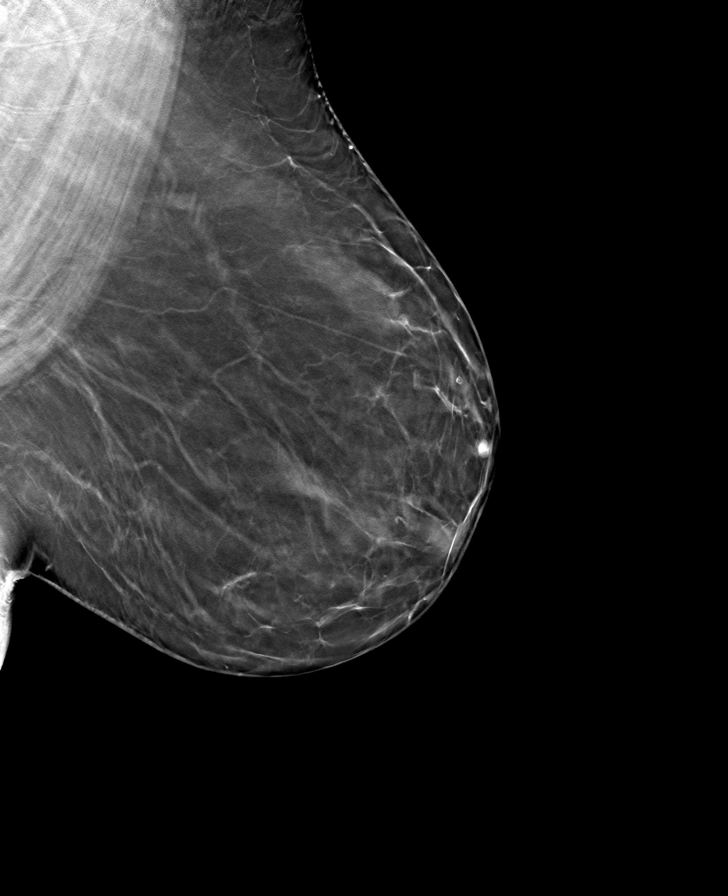

[R CC tomo · tomo slice 31/62.0]
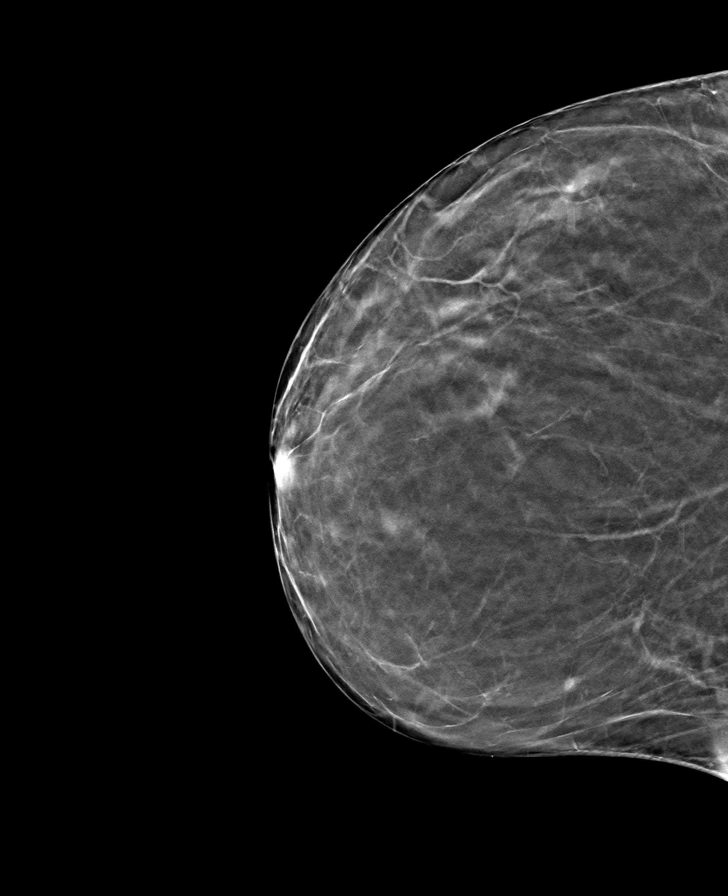

[L CC tomo · tomo slice 34/67.0]
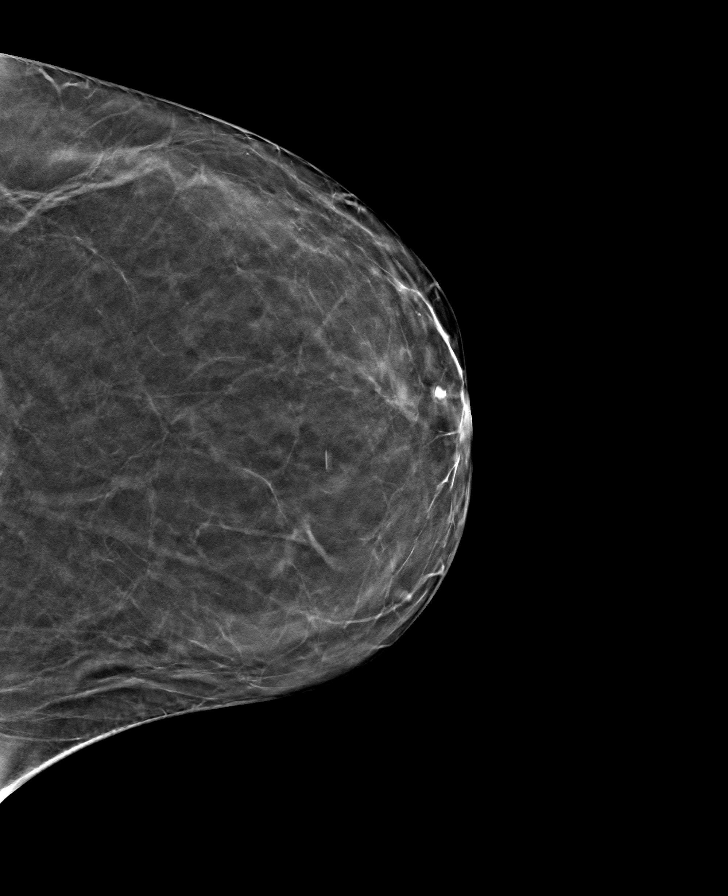

[8 of 24 positions shown; findings below may reference images not displayed]

FINDINGS: There are no findings suspicious for malignancy. The images were
evaluated with computer-aided detection.
IMPRESSION: No mammographic evidence of malignancy. A result letter of this
screening mammogram will be mailed directly to the patient.

RECOMMENDATION:
Screening mammogram in one year. (Code:[8E])

BI-RADS CATEGORY  1: Negative.

## 2021-03-25 ENCOUNTER — Ambulatory Visit: Payer: Medicare Other | Admitting: Orthopaedic Surgery

## 2021-04-06 ENCOUNTER — Ambulatory Visit (INDEPENDENT_AMBULATORY_CARE_PROVIDER_SITE_OTHER): Payer: Medicare Other | Admitting: Orthopaedic Surgery

## 2021-04-06 ENCOUNTER — Encounter: Payer: Self-pay | Admitting: Orthopaedic Surgery

## 2021-04-06 ENCOUNTER — Ambulatory Visit (INDEPENDENT_AMBULATORY_CARE_PROVIDER_SITE_OTHER): Payer: Medicare Other

## 2021-04-06 ENCOUNTER — Other Ambulatory Visit: Payer: Self-pay

## 2021-04-06 VITALS — Ht <= 58 in | Wt 205.0 lb

## 2021-04-06 DIAGNOSIS — M1612 Unilateral primary osteoarthritis, left hip: Secondary | ICD-10-CM | POA: Diagnosis not present

## 2021-04-06 DIAGNOSIS — M25552 Pain in left hip: Secondary | ICD-10-CM

## 2021-04-06 NOTE — Progress Notes (Signed)
Office Visit Note   Patient: Alyssa Rosales           Date of Birth: 1950/11/25           MRN: [id number] Visit Date: 04/06/2021              Requested by: [name], MD [address] [place],  [place] [address] PCP: [name], MD   Assessment & Plan: Visit Diagnoses:  1. Pain in left hip   2. Unilateral primary osteoarthritis, left hip   3. Severe obesity (BMI >= 40) (HCC)     Plan: I went over the patient's x-rays with her.  She is in need of a left total hip arthroplasty given the severity of her arthritis of her left hip on plain films and given her clinical exam findings as well as signs and symptoms.  I counseled her about the importance of weight loss.  We do need to document a weight loss since is the first time of seeing her.  Hopefully if she can lose a few more pounds we can go ahead and set her up for surgery for a left total hip arthroplasty.  I would like to see her back in 3 months.  When we weigh her then we can either put her in shorts or a hospital gown and have her stand up straight and get a new weight.  If there is weight loss we can document this and we would feel better about getting her set up for surgery.  She understands that there is a high complication rate in the morbidly obese population for this type of surgery.  All questions and concerns were answered and addressed.  We will see her back in 3 months.  Follow-Up Instructions: Return in about 3 months (around 07/07/2021).   Orders:  Orders Placed This Encounter  Procedures  . XR HIP UNILAT W OR W/O PELVIS 2-3 VIEWS LEFT   No orders of the defined types were placed in this encounter.     Procedures: No procedures performed   Clinical Data: No additional findings.   Subjective: Chief Complaint  Patient presents with  . Left Hip - Pain  The patient comes in with debilitating and severe left hip pain.  She has known severe end-stage arthritis of the left hip and she is seen  multiple orthopedic surgeons who have turned her down for surgery because her BMI is over 40.  Today her BMI is 43.59.  She is not a diabetic.  Her last hemoglobin A1c was in the low 5 range.  Her left hip pain is severe and is daily.  It is detrimentally affecting her mobility, her quality of life and her actives of daily living.  She is lost over 60 pounds already.  Her right hip is normal.  There is a MRI of her left hip showing severe end-stage arthritis and she agreed to get plain films today.  She walks with a Trendelenburg gait as well.  HPI  Review of Systems She currently denies any headache, chest pain, shortness of breath, fever, chills, nausea, vomiting  Objective: Vital Signs: Ht 4' 9.5" (1.461 m)   Wt 205 lb (93 kg)   BMI 43.59 kg/m   Physical Exam She is alert and orient x3 and in no acute distress Ortho Exam Examination of her right hip is normal.  Examination of her left hip shows significant decrease in internal and external rotation with severe pain in the groin  with attempts of rotation.  She is shoulder on the left side than the right side.  When I have her lay in a supine position there is a plane where we can get to her hip in terms of mobilizing her pannus and soft tissue. Specialty Comments:  No specialty comments available.  Imaging: XR HIP UNILAT W OR W/O PELVIS 2-3 VIEWS LEFT  Result Date: 04/06/2021 An AP pelvis and lateral left hip shows severe loss of the joint space of the left hip.  There is actually femoral head collapse and no articular surface left at all in the femoral head and asked him.  The right hip appears normal.  This is severe end-stage arthritis.    PMFS History: Patient Active Problem List   Diagnosis Date Noted  . Unilateral primary osteoarthritis, left hip 04/06/2021  . Atrophic vaginitis 06/08/2019  . Overactive thyroid gland 04/19/2019  . Abdominal aortic aneurysm (AAA) without rupture (HCC) 09/01/2017  . History of thyroid nodule  04/21/2017  . Coronary artery disease involving native coronary artery of native heart without angina pectoris 02/09/2017  . Thoracic aortic aneurysm without rupture (HCC) 02/09/2017  . Hyperlipidemia 01/27/2017  . OSA (obstructive sleep apnea) 09/30/2016  . Moderate persistent asthma without complication 08/10/2016  . Restrictive lung disease 08/10/2016  . Lumbar pain with radiation down both legs 06/01/2016  . Bilateral hip pain 06/01/2016  . Dyslipidemia 02/16/2016  . Dyspnea on exertion 02/16/2016  . Precordial pain 02/16/2016  . GERD without esophagitis 01/07/2016  . Essential hypertension 01/07/2016  . Morbid obesity with BMI of 45.0-49.9, adult (HCC) 01/07/2016  . Malaise and fatigue 01/07/2016  . Abnormal glucose 01/07/2016  . Primary osteoarthritis involving multiple joints 01/07/2016   Past Medical History:  Diagnosis Date  . Abdominal aortic aneurysm (AAA) without rupture (HCC) 09/01/2017  . Coronary artery disease involving native coronary artery of native heart without angina pectoris 02/09/2017  . Edema, lower extremity   . GERD without esophagitis 01/07/2016   Last Assessment & Plan:  Continue with the PPI and have refilled this for her  . History of kidney problems   . Hyperlipidemia 01/27/2017   Overview:  Added automatically from request for surgery [phone]  . Hypertension   . Joint pain   . Kidney stone   . Moderate persistent asthma without complication 08/10/2016  . OSA (obstructive sleep apnea) 09/30/2016   Follows with pulmonary is seen 12/19 for FU bipap 21/15  . Shortness of breath   . Vitamin D deficiency     Family History  Problem Relation Age of Onset  . Alzheimer's disease Mother   . Hyperlipidemia Mother   . Hypertension Mother   . Diabetes Mother   . Hypertension Father   . Hyperlipidemia Father   . Breast cancer Neg Hx     Past Surgical History:  Procedure Laterality Date  . AMPUTATION Right 10/21/2020   Procedure: AMPUTATION RIGHT MIDDLE  FINGER;  Surgeon: Cindee Salt, MD;  Location: [facility];  Service: Orthopedics;  Laterality: Right;  IV REGIONAL FOREARM BLOCK, BIER BLOCK  . CORONARY ANGIOPLASTY    . INCISION AND DRAINAGE Right 11/14/2020   Procedure: INCISION AND DRAINAGE RIGHT MIDDLE FINGER;  Surgeon: Cindee Salt, MD;  Location: [facility];  Service: Orthopedics;  Laterality: Right;  . kidney stone removal    . RECTAL SURGERY    . spur removal    . TOOTH EXTRACTION     Social History   Occupational History  .  Occupation: [employer]   Tobacco Use  . Smoking status: Never Smoker  . Smokeless tobacco: Never Used  Vaping Use  . Vaping Use: Never used  Substance and Sexual Activity  . Alcohol use: Never  . Drug use: Never  . Sexual activity: Not on file

## 2021-07-07 ENCOUNTER — Encounter: Payer: Self-pay | Admitting: Orthopaedic Surgery

## 2021-07-07 ENCOUNTER — Ambulatory Visit (INDEPENDENT_AMBULATORY_CARE_PROVIDER_SITE_OTHER): Payer: Medicare Other | Admitting: Orthopaedic Surgery

## 2021-07-07 VITALS — Ht <= 58 in | Wt 192.0 lb

## 2021-07-07 DIAGNOSIS — M1612 Unilateral primary osteoarthritis, left hip: Secondary | ICD-10-CM

## 2021-07-07 NOTE — Progress Notes (Signed)
The patient comes in today for a weight check and BMI calculation.  She is someone with severe end-stage arthritis of her left hip and the x-rays show almost collapse of the femoral head.  She has a hard time getting around but even at 70 years old still works.  She is not a diabetic.  When I saw her last her BMI was 43.  Today it is down to 40.  We talked in length in detail about hip replacement surgery.  She currently denies any headache, chest pain, shortness of breath, fever, chills, nausea, vomiting.  She has had no acute change in her medical status.  She does mobilize slowly.  On exam her right hip moves smoothly.  Her left hip has severe limitations with internal and external rotation and severe pain with rotation.  I reviewed her x-rays again of her left hip showing severe end-stage arthritis with complete loss of joint space and almost femoral head collapse.  I went over hip replacement surgery with her in detail.  She understands that there is certainly heightened risk of skin and soft tissue issues infection as well as other complications as a relates to her morbid obesity.  Given the detrimental effect her arthritis of the left hip is having on her mobility, her quality of life and her actives daily living I am recommending a total hip arthroplasty and we can work on getting this scheduled.  She will continue her aggressive weight loss in the interim while we work on getting this scheduled.  All questions and concerns were answered and addressed.

## 2021-08-05 ENCOUNTER — Encounter: Payer: Self-pay | Admitting: Gastroenterology

## 2021-08-13 ENCOUNTER — Other Ambulatory Visit: Payer: Self-pay

## 2021-08-19 ENCOUNTER — Other Ambulatory Visit: Payer: Self-pay | Admitting: *Deleted

## 2021-08-19 DIAGNOSIS — I2510 Atherosclerotic heart disease of native coronary artery without angina pectoris: Secondary | ICD-10-CM

## 2021-08-19 MED ORDER — NITROGLYCERIN 0.4 MG SL SUBL: 0.4000 mg | SUBLINGUAL_TABLET | SUBLINGUAL | 3 refills | Status: DC | PRN

## 2021-08-27 NOTE — Progress Notes (Signed)
Please enter orders for surgery scheduled for 09-11-21

## 2021-08-28 ENCOUNTER — Other Ambulatory Visit: Payer: Self-pay | Admitting: Physician Assistant

## 2021-08-28 DIAGNOSIS — M1612 Unilateral primary osteoarthritis, left hip: Secondary | ICD-10-CM

## 2021-08-31 NOTE — Progress Notes (Addendum)
COVID swab appointment: 09-09-21  COVID Vaccine Completed: Yes x2 Date COVID Vaccine completed: 07-03-20 07-29-20 Has received booster:  No COVID vaccine manufacturer: Pfizer    [employer]   Date of COVID positive in last 90 days:  No  PCP - Feliciana Rossetti, MD Cardiologist - [name], MD  Chest x-ray - N/A EKG - 09-03-21 Epic Stress Test - 05-21-20 Epic ECHO - greater than 2 years, Epic Cardiac Cath - greater than 2 years, CEW Pacemaker/ICD device last checked: Spinal Cord Stimulator:  Sleep Study - Yes, +sleep apnea CPAP - Yes  Fasting Blood Sugar - N/A Checks Blood Sugar _____ times a day  Blood Thinner Instructions:  N/A Aspirin Instructions: Last Dose:  Activity level:  Patient states that she does get SOB with climbing stairs at times.  Able to perform activities of daily living without stopping and without symptoms of chest pain or shortness of breath.  Patient works full time and has no issues with SOB or chest pain.       Anesthesia review:  AAA, CAD, HTN, OSA  Patient denies shortness of breath, fever, cough and chest pain at PAT appointment   Patient verbalized understanding of instructions that were given to them at the PAT appointment. Patient was also instructed that they will need to review over the PAT instructions again at home before surgery.

## 2021-08-31 NOTE — Patient Instructions (Addendum)
DUE TO COVID-19 ONLY ONE VISITOR IS ALLOWED TO COME WITH YOU AND STAY IN THE WAITING ROOM ONLY DURING PRE OP AND PROCEDURE.   **NO VISITORS ARE ALLOWED IN THE SHORT STAY AREA OR RECOVERY ROOM!!**  IF YOU WILL BE ADMITTED INTO THE HOSPITAL YOU ARE ALLOWED ONLY TWO SUPPORT PEOPLE DURING VISITATION HOURS ONLY (7 AM -8PM)    Up to two visitors ages 60+ are allowed at one time in a patient's room.  The visitors may rotate out with other people throughout the day.  Additionally, up to two children between the ages of 74 and 38 are allowed and do not count toward the number of allowed visitors.  Children within this age range must be accompanied by an adult visitor.  One adult visitor may remain with the patient overnight and must be in the room by 8 PM.  COVID SWAB TESTING MUST BE COMPLETED ON: 09-09-21, Between the hours of 8 and 3  **MUST PRESENT COMPLETED FORM AT TESTING SITE**    [address] [place] (backside of the building)  You are not required to quarantine, however you are required to wear a well-fitted mask when you are out and around people not in your household.  Hand Hygiene often Do NOT share personal items Notify your provider if you are in close contact with someone who has COVID or you develop fever 100.4 or greater, new onset of sneezing, cough, sore throat, shortness of breath or body aches.        Your procedure is scheduled on:  Friday, 09-11-21   Report to [facility] Main  Entrance     Report to admitting at 8:15 AM   Call this number if you have problems the morning of surgery [phone]   Do not eat food :After Midnight.   May have liquids until 8:00 AM day of surgery  CLEAR LIQUID DIET  Foods Allowed                                                                     Foods Excluded  Water, Black Coffee (no milk/no creamer) and tea, regular and decaf                              liquids that you cannot  Plain Jell-O in any flavor  (No  red)                         see through such as: Fruit ices (not with fruit pulp)                                 milk, soups, orange juice  Iced Popsicles (No red)                                    All solid food                             Apple juices Sports drinks  like Gatorade (No red) Lightly seasoned clear broth or consume(fat free) Sugar      Complete one Ensure drink the morning of surgery at 8:00 AM the day of surgery.    The day of surgery:  Drink ONE (1) Pre-Surgery Clear Ensure the morning of surgery. Drink in one sitting. Do not sip.  This drink was given to you during your hospital  pre-op appointment visit. Nothing else to drink after completing the Pre-Surgery Clear Ensure.          If you have questions, please contact your surgeon's office.     Oral Hygiene is also important to reduce your risk of infection.                                    Remember - BRUSH YOUR TEETH THE MORNING OF SURGERY WITH YOUR REGULAR TOOTHPASTE   Do NOT smoke after Midnight  Take these medicines the morning of surgery with A SIP OF WATER:  Amlodipine, Metoprolol, Pantoprazole.  Tramadol if needed   Stop all vitamins and herbal supplements a week before surgery   Bring CPAP mask and tubing day of surgery             You may not have any metal on your body including hair pins, jewelry, and body piercing             Do not wear make-up, lotions, powders, perfumes or deodorant  Do not wear nail polish including gel and S&S, artificial/acrylic nails, or any other type of covering on natural nails including finger and toenails. If you have artificial nails, gel coating, etc. that needs to be removed by a nail salon please have this removed prior to surgery or surgery may need to be canceled/ delayed if the surgeon/ anesthesia feels like they are unable to be safely monitored.   Do not shave  48 hours prior to surgery.    Do not bring valuables to the hospital. [facility] IS NOT  RESPONSIBLE FOR VALUABLES.   Contacts, dentures or bridgework may not be worn into surgery.   Bring small overnight bag day of surgery.    Special Instructions: Bring a copy of your healthcare power of attorney and living will documents the day of surgery if you haven't scanned them in before.  Please read over the following fact sheets you were given: IF YOU HAVE QUESTIONS ABOUT YOUR PRE OP INSTRUCTIONS PLEASE CALL [phone] [facility] - Preparing for Surgery Before surgery, you can play an important role.  Because skin is not sterile, your skin needs to be as free of germs as possible.  You can reduce the number of germs on your skin by washing with CHG (chlorahexidine gluconate) soap before surgery.  CHG is an antiseptic cleaner which kills germs and bonds with the skin to continue killing germs even after washing. Please DO NOT use if you have an allergy to CHG or antibacterial soaps.  If your skin becomes reddened/irritated stop using the CHG and inform your nurse when you arrive at Short Stay. Do not shave (including legs and underarms) for at least 48 hours prior to the first CHG shower.  You may shave your face/neck.  Please follow these instructions carefully:  1.  Shower with CHG Soap the night before surgery and the  morning of surgery.  2.  If you choose to wash your hair, wash  your hair first as usual with your normal  shampoo.  3.  After you shampoo, rinse your hair and body thoroughly to remove the shampoo.                             4.  Use CHG as you would any other liquid soap.  You can apply chg directly to the skin and wash.  Gently with a scrungie or clean washcloth.  5.  Apply the CHG Soap to your body ONLY FROM THE NECK DOWN.   Do   not use on face/ open                           Wound or open sores. Avoid contact with eyes, ears mouth and   genitals (private parts).                       Wash face,  Genitals (private parts) with your normal soap.              6.  Wash thoroughly, paying special attention to the area where your    surgery  will be performed.  7.  Thoroughly rinse your body with warm water from the neck down.  8.  DO NOT shower/wash with your normal soap after using and rinsing off the CHG Soap.                9.  Pat yourself dry with a clean towel.            10.  Wear clean pajamas.            11.  Place clean sheets on your bed the night of your first shower and do not  sleep with pets. Day of Surgery : Do not apply any lotions/deodorants the morning of surgery.  Please wear clean clothes to the hospital/surgery center.  FAILURE TO FOLLOW THESE INSTRUCTIONS MAY RESULT IN THE CANCELLATION OF YOUR SURGERY  PATIENT SIGNATURE_________________________________  NURSE SIGNATURE__________________________________  ________________________________________________________________________   Rogelia Mire  An incentive spirometer is a tool that can help keep your lungs clear and active. This tool measures how well you are filling your lungs with each breath. Taking long deep breaths may help reverse or decrease the chance of developing breathing (pulmonary) problems (especially infection) following: A long period of time when you are unable to move or be active. BEFORE THE PROCEDURE  If the spirometer includes an indicator to show your best effort, your nurse or respiratory therapist will set it to a desired goal. If possible, sit up straight or lean slightly forward. Try not to slouch. Hold the incentive spirometer in an upright position. INSTRUCTIONS FOR USE  Sit on the edge of your bed if possible, or sit up as far as you can in bed or on a chair. Hold the incentive spirometer in an upright position. Breathe out normally. Place the mouthpiece in your mouth and seal your lips tightly around it. Breathe in slowly and as deeply as possible, raising the piston or the ball toward the top of the column. Hold your breath for 3-5  seconds or for as long as possible. Allow the piston or ball to fall to the bottom of the column. Remove the mouthpiece from your mouth and breathe out normally. Rest for a few seconds and repeat Steps 1 through 7 at least 10 times  every 1-2 hours when you are awake. Take your time and take a few normal breaths between deep breaths. The spirometer may include an indicator to show your best effort. Use the indicator as a goal to work toward during each repetition. After each set of 10 deep breaths, practice coughing to be sure your lungs are clear. If you have an incision (the cut made at the time of surgery), support your incision when coughing by placing a pillow or rolled up towels firmly against it. Once you are able to get out of bed, walk around indoors and cough well. You may stop using the incentive spirometer when instructed by your caregiver.  RISKS AND COMPLICATIONS Take your time so you do not get dizzy or light-headed. If you are in pain, you may need to take or ask for pain medication before doing incentive spirometry. It is harder to take a deep breath if you are having pain. AFTER USE Rest and breathe slowly and easily. It can be helpful to keep track of a log of your progress. Your caregiver can provide you with a simple table to help with this. If you are using the spirometer at home, follow these instructions: SEEK MEDICAL CARE IF:  You are having difficultly using the spirometer. You have trouble using the spirometer as often as instructed. Your pain medication is not giving enough relief while using the spirometer. You develop fever of 100.5 F (38.1 C) or higher. SEEK IMMEDIATE MEDICAL CARE IF:  You cough up bloody sputum that had not been present before. You develop fever of 102 F (38.9 C) or greater. You develop worsening pain at or near the incision site. MAKE SURE YOU:  Understand these instructions. Will watch your condition. Will get help right away if you are  not doing well or get worse. Document Released: 02/28/2007 Document Revised: 01/10/2012 Document Reviewed: 05/01/2007 [employer] Patient Information 2014 [employer], [place].   ________________________________________________________________________  WHAT IS A BLOOD TRANSFUSION? Blood Transfusion Information  A transfusion is the replacement of blood or some of its parts. Blood is made up of multiple cells which provide different functions. Red blood cells carry oxygen and are used for blood loss replacement. White blood cells fight against infection. Platelets control bleeding. Plasma helps clot blood. Other blood products are available for specialized needs, such as hemophilia or other clotting disorders. BEFORE THE TRANSFUSION  Who gives blood for transfusions?  Healthy volunteers who are fully evaluated to make sure their blood is safe. This is blood bank blood. Transfusion therapy is the safest it has ever been in the practice of medicine. Before blood is taken from a donor, a complete history is taken to make sure that person has no history of diseases nor engages in risky social behavior (examples are intravenous drug use or sexual activity with multiple partners). The donor's travel history is screened to minimize risk of transmitting infections, such as malaria. The donated blood is tested for signs of infectious diseases, such as HIV and hepatitis. The blood is then tested to be sure it is compatible with you in order to minimize the chance of a transfusion reaction. If you or a relative donates blood, this is often done in anticipation of surgery and is not appropriate for emergency situations. It takes many days to process the donated blood. RISKS AND COMPLICATIONS Although transfusion therapy is very safe and saves many lives, the main dangers of transfusion include:  Getting an infectious disease. Developing a transfusion reaction. This is an  allergic reaction to something in the blood  you were given. Every precaution is taken to prevent this. The decision to have a blood transfusion has been considered carefully by your caregiver before blood is given. Blood is not given unless the benefits outweigh the risks. AFTER THE TRANSFUSION Right after receiving a blood transfusion, you will usually feel much better and more energetic. This is especially true if your red blood cells have gotten low (anemic). The transfusion raises the level of the red blood cells which carry oxygen, and this usually causes an energy increase. The nurse administering the transfusion will monitor you carefully for complications. HOME CARE INSTRUCTIONS  No special instructions are needed after a transfusion. You may find your energy is better. Speak with your caregiver about any limitations on activity for underlying diseases you may have. SEEK MEDICAL CARE IF:  Your condition is not improving after your transfusion. You develop redness or irritation at the intravenous (IV) site. SEEK IMMEDIATE MEDICAL CARE IF:  Any of the following symptoms occur over the next 12 hours: Shaking chills. You have a temperature by mouth above 102 F (38.9 C), not controlled by medicine. Chest, back, or muscle pain. People around you feel you are not acting correctly or are confused. Shortness of breath or difficulty breathing. Dizziness and fainting. You get a rash or develop hives. You have a decrease in urine output. Your urine turns a dark color or changes to pink, red, or brown. Any of the following symptoms occur over the next 10 days: You have a temperature by mouth above 102 F (38.9 C), not controlled by medicine. Shortness of breath. Weakness after normal activity. The white part of the eye turns yellow (jaundice). You have a decrease in the amount of urine or are urinating less often. Your urine turns a dark color or changes to pink, red, or brown. Document Released: 10/15/2000 Document Revised:  01/10/2012 Document Reviewed: 06/03/2008 [facility] Patient Information 2014 [place], [place].  _______________________________________________________________________

## 2021-09-03 ENCOUNTER — Encounter (HOSPITAL_COMMUNITY)
Admission: RE | Admit: 2021-09-03 | Discharge: 2021-09-03 | Disposition: A | Discharge status: Home / Self Care | Payer: Medicare Other | Source: Ambulatory Visit | Attending: Orthopaedic Surgery | Admitting: Orthopaedic Surgery

## 2021-09-03 ENCOUNTER — Encounter (HOSPITAL_COMMUNITY): Payer: Self-pay

## 2021-09-03 ENCOUNTER — Other Ambulatory Visit: Payer: Self-pay

## 2021-09-03 VITALS — BP 143/85 | HR 56 | Temp 98.2°F | Resp 18 | Ht <= 58 in | Wt 185.2 lb

## 2021-09-03 DIAGNOSIS — I7121 Aneurysm of the ascending aorta, without rupture: Secondary | ICD-10-CM | POA: Insufficient documentation

## 2021-09-03 DIAGNOSIS — M1612 Unilateral primary osteoarthritis, left hip: Secondary | ICD-10-CM | POA: Diagnosis not present

## 2021-09-03 DIAGNOSIS — K219 Gastro-esophageal reflux disease without esophagitis: Secondary | ICD-10-CM | POA: Diagnosis not present

## 2021-09-03 DIAGNOSIS — Z79899 Other long term (current) drug therapy: Secondary | ICD-10-CM | POA: Diagnosis not present

## 2021-09-03 DIAGNOSIS — I2510 Atherosclerotic heart disease of native coronary artery without angina pectoris: Secondary | ICD-10-CM | POA: Insufficient documentation

## 2021-09-03 DIAGNOSIS — G4733 Obstructive sleep apnea (adult) (pediatric): Secondary | ICD-10-CM | POA: Diagnosis not present

## 2021-09-03 DIAGNOSIS — I10 Essential (primary) hypertension: Secondary | ICD-10-CM | POA: Diagnosis not present

## 2021-09-03 DIAGNOSIS — Z01818 Encounter for other preprocedural examination: Secondary | ICD-10-CM | POA: Diagnosis present

## 2021-09-03 HISTORY — DX: Unspecified osteoarthritis, unspecified site: M19.90

## 2021-09-03 HISTORY — DX: Personal history of urinary calculi: Z87.442

## 2021-09-03 LAB — BASIC METABOLIC PANEL
Anion gap: 6 (ref 5–15)
BUN: 33 mg/dL — ABNORMAL HIGH (ref 8–23)
CO2: 26 mmol/L (ref 22–32)
Calcium: 9.3 mg/dL (ref 8.9–10.3)
Chloride: 109 mmol/L (ref 98–111)
Creatinine, Ser: 0.81 mg/dL (ref 0.44–1.00)
GFR, Estimated: >60 mL/min (ref >60)
Glucose, Bld: 100 mg/dL — ABNORMAL HIGH (ref 70–99)
Potassium: 3.3 mmol/L — ABNORMAL LOW (ref 3.5–5.1)
Sodium: 141 mmol/L (ref 135–145)

## 2021-09-03 LAB — SURGICAL PCR SCREEN
MRSA, PCR: NEGATIVE
Staphylococcus aureus: NEGATIVE

## 2021-09-03 LAB — CBC
HCT: 41.5 % (ref 36.0–46.0)
Hemoglobin: 13.6 g/dL (ref 12.0–15.0)
MCH: 31.6 pg (ref 26.0–34.0)
MCHC: 32.8 g/dL (ref 30.0–36.0)
MCV: 96.3 fL (ref 80.0–100.0)
Platelets: 201 10*3/uL (ref 150–400)
RBC: 4.31 MIL/uL (ref 3.87–5.11)
RDW: 12.6 % (ref 11.5–15.5)
WBC: 5.8 10*3/uL (ref 4.0–10.5)
nRBC: 0 % (ref 0.0–0.2)

## 2021-09-04 ENCOUNTER — Encounter (HOSPITAL_COMMUNITY): Payer: Self-pay | Admitting: Physician Assistant

## 2021-09-04 NOTE — Progress Notes (Addendum)
Anesthesia Chart Review   Case: [id number] Date/Time: 09/11/21 1045   Procedure: LEFT TOTAL HIP ARTHROPLASTY ANTERIOR APPROACH (Left: Hip)   Anesthesia type: Spinal   Pre-op diagnosis: osteoarthritis left hip   Location: [facility] ROOM 10 / [facility]   Surgeons: Kathryne Hitch, MD       DISCUSSION:70 y.o. never smoker with h/o GERD, HTN, asthma, OSA, AAA, CAD, left hip OA scheduled for above procedure 09/11/21 with Dr. Doneen Poisson.   Cardiac clearance requested.   Addendum 09/10/2021:  Pt seen by cardiology 09/10/2021. Per OV note, "Cardiovascular preop evaluation for this lady with luminal disease based on cardiac catheterization, last evaluation last year showing no evidence of ischemia based on stress test.  She does not have any symptomatology suggest coronary artery disease.  There is no chest pain tightness squeezing pressure burning chest.  Until 3 weeks ago she was able to walk and help.  She still works in spite of her age.  Therefore I do not think we need to perform any CAD evaluation at this stage.  Since she does have history of stenting aortic aneurysm I think it will be reasonable to perform echocardiogram to check the size of the arteries and also check left ventricle ejection fraction.  If echocardiogram did not show any significant enlargement of her ascending aneurysm she would be acceptable candidate for surgery from cardiac standpoint reviewed."  Echo pending at this time.  Dr. Eliberto Ivory office aware.  VS: BP (!) 143/85   Pulse (!) 56   Temp 36.8 C (Oral)   Resp 18   Ht 4' 8.5" (1.435 m)   Wt 84 kg   SpO2 98%   BMI 40.79 kg/m   PROVIDERS: [name], MD is PCP   Butler Denmark, MD is Cardiologist  LABS: Labs reviewed: Acceptable for surgery. 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 Recommend semi-annual imaging followup by CTA or MRA and referral to cardiothoracic surgery if not already obtained. This recommendation follows 2010  EKG: 09/03/2021 Rate 50 bpm  Sinus bradycardia LAD  CV: Stress Test 05/21/2020 Nuclear stress EF: 57%. The left ventricular ejection fraction is normal (55-65%). There was no ST segment deviation noted during stress. This is a low risk study. No ischemia, no MI, Normal EF.  Echo 12/20/2018  1. The left ventricle has normal systolic function with an ejection  fraction of 60-65%. The cavity size was normal. There is mildly increased  left ventricular wall thickness. Left ventricular diastolic Doppler  parameters are consistent with impaired  relaxation.   2. The right ventricle has normal systolic function. The cavity was  normal. There is no increase in right ventricular wall thickness.   3. Right atrial size was moderately dilated.   4. The mitral valve is normal in structure.   5. The tricuspid valve is normal in structure.   6. The aortic valve is normal in structure.   7. The pulmonic valve was normal in structure.   8.  There is moderate dilatation of the ascending aorta measuring 46 mm. Past Medical History:  Diagnosis Date   Abdominal aortic aneurysm (AAA) without rupture 09/01/2017   Arthritis    Coronary artery disease involving native coronary artery of native heart without angina pectoris 02/09/2017   Edema, lower extremity    GERD without esophagitis 01/07/2016   Last Assessment & Plan:  Continue with the PPI and have refilled this for her   History of kidney problems    History of kidney stones    Hyperlipidemia 01/27/2017   Overview:   Added automatically from request for surgery [id number]   Hypertension    Joint pain    Kidney stone    Moderate persistent asthma without complication 08/10/2016   OSA (obstructive sleep apnea) 09/30/2016   Follows with pulmonary is seen 12/19 for FU bipap 21/15   Shortness of breath    Vitamin D deficiency     Past Surgical History:  Procedure Laterality Date   AMPUTATION Right 10/21/2020   Procedure: AMPUTATION RIGHT MIDDLE FINGER;  Surgeon: Cindee Salt, MD;  Location: [facility];  Service: Orthopedics;  Laterality: Right;  IV REGIONAL FOREARM BLOCK, BIER BLOCK   CORONARY ANGIOPLASTY     INCISION AND DRAINAGE Right 11/14/2020   Procedure: INCISION AND DRAINAGE RIGHT MIDDLE FINGER;  Surgeon: Cindee Salt, MD;  Location: [facility];  Service: Orthopedics;  Laterality: Right;   kidney stone removal     RECTAL SURGERY     spur removal     TOOTH EXTRACTION      MEDICATIONS:  albuterol (VENTOLIN HFA) 108 (90 Base) MCG/ACT inhaler   amLODipine (NORVASC) 2.5 MG tablet   Apoaequorin (PREVAGEN EXTRA STRENGTH PO)   APPLE CIDER VINEGAR PO   Cyanocobalamin (VITAMIN B-12 PO)   ibuprofen (ADVIL) 800 MG tablet   loperamide (IMODIUM) 2 MG capsule   losartan (COZAAR) 100 MG tablet   meclizine (ANTIVERT) 25 MG tablet   metoprolol succinate (TOPROL-XL) 25 MG 24 hr tablet   niacin 500 MG tablet   nitroGLYCERIN (NITROSTAT) 0.4 MG SL tablet   OVER THE COUNTER MEDICATION   OVER THE COUNTER MEDICATION   pantoprazole (PROTONIX) 40 MG tablet   pyridoxine (B-6) 500 MG tablet   torsemide (DEMADEX) 10 MG tablet   traMADol (ULTRAM) 50 MG tablet   Vitamin D, Ergocalciferol, (DRISDOL) 1.25 MG (50000 UT) CAPS capsule   No current facility-administered medications for this encounter.     Jodell Cipro Ward, PA-C [facility] Pre-Surgical Testing [phone]

## 2021-09-09 ENCOUNTER — Telehealth: Payer: Self-pay | Admitting: Cardiology

## 2021-09-09 ENCOUNTER — Other Ambulatory Visit: Payer: Self-pay | Admitting: Orthopaedic Surgery

## 2021-09-09 LAB — SARS CORONAVIRUS 2 (TAT 6-24 HRS): SARS Coronavirus 2: NEGATIVE

## 2021-09-09 NOTE — Telephone Encounter (Signed)
   [facility] Pre-operative Risk Assessment    Patient Name: Alyssa Rosales  DOB: [birth year] MRN: [id number]  HEARTCARE STAFF:  - IMPORTANT!!!!!! Under Visit Info/Reason for Call, type in Other and utilize the format Clearance MM/DD/YY or Clearance TBD. Do not use dashes or single digits. - Please review there is not already an duplicate clearance open for this procedure. - If request is for dental extraction, please clarify the # of teeth to be extracted. - If the patient is currently at the dentist's office, call Pre-Op Callback Staff (MA/nurse) to input urgent request.  - If the patient is not currently in the dentist office, please route to the Pre-Op pool.  Request for surgical clearance:  What type of surgery is being performed? Total hip arthroplasty  When is this surgery scheduled? 09/11/2021  What type of clearance is required (medical clearance vs. Pharmacy clearance to hold med vs. Both)? Medical  Are there any medications that need to be held prior to surgery and how long? no  Practice name and name of physician performing surgery? [facility], Dr. Santiago Bumpers  What is the office phone number? [phone]   7.   What is the office fax number? [phone]  8.   Anesthesia type (None, local, MAC, general) ? spinal   Selena Zobro 09/09/2021, 4:08 PM  _________________________________________________________________   (provider comments below)

## 2021-09-09 NOTE — Telephone Encounter (Signed)
I s/w the pt and informed her that she will need an appt for pre op clearance. Pt see Dr. Bing Matter in the [facility] office. I did not see any appts available in the [place] office. Pt is very upset and began rasing her voice at me. Pt yelling that she has been waiting for this surgery for along time, she states she has taken the 11 weeks off already for this surgery. I explained to the pt that this is not the fault of cardiology office, that our office did not receive the clearance request until today 09/09/21 at 4:08 pm. If requesting for cardiac clearance, generally we need more than 2 days notice. I tried to explain to the pt as she was yelling and crying that the surgeon is asking cardiac clearance and that is what we need to provide. I explained to the pt that it is our job to help protect her, her heart and be sure that her heart is ok to proceed with major surgery.   I asked the pt to please stop yelling at me, that I was trying my best to help her. I firs scheduled the pt to see our DOD on Monday 09/14/21 Dr. Elease Hashimoto. After I hung up the phone, I decided to check the [facility] office one more time, even though the pt is seen in the [place] office. I was able to find an appt that just opened up on Dr. Vanetta Shawl schedule for tomorrow 09/10/21 @ 1:20 pm in the [facility] office. I called the pt and she is agreeable to new plan of care to see Dr. Bing Matter in the [facility] office tomorrow 09/10/21 @ 1:20. Pt asked did I think that the doctor would say she is ok to proceed with the surgery. I informed the pt that I cannot speak for the doctor and that will be up to Dr. Bing Matter to decide. I did assure the pt that I will forward these notes to surgeon's office as well that she is needing an appt.   I will forward this note to Dr. Bing Matter for appt tomorrow.

## 2021-09-09 NOTE — Telephone Encounter (Signed)
   Name: Alyssa Rosales  DOB: October 15, 1951  MRN: [id number]  Primary Cardiologist: [name], MD  Chart reviewed as part of pre-operative protocol coverage. Patient has not been seen in over 1 year (since 03/2020). Therefore,  she will require a follow-up visit in order to better assess preoperative cardiovascular risk. Suspect her surgery will have to be postponed as I doubt we will be able to get her seen tomorrow.  Pre-op covering staff: - Please schedule appointment and call patient to inform them. If patient already had an upcoming appointment within acceptable timeframe, please add "pre-op clearance" to the appointment notes so provider is aware. - Please contact requesting surgeon's office via preferred method (i.e, phone, fax) to inform them of need for appointment prior to surgery.   Corrin Parker, PA-C  09/09/2021, 4:46 PM

## 2021-09-10 ENCOUNTER — Ambulatory Visit (INDEPENDENT_AMBULATORY_CARE_PROVIDER_SITE_OTHER): Payer: Medicare Other | Admitting: Cardiology

## 2021-09-10 ENCOUNTER — Telehealth: Payer: Self-pay

## 2021-09-10 ENCOUNTER — Other Ambulatory Visit: Payer: Self-pay

## 2021-09-10 ENCOUNTER — Encounter: Payer: Self-pay | Admitting: Cardiology

## 2021-09-10 VITALS — BP 140/80 | HR 57 | Ht <= 58 in | Wt 190.0 lb

## 2021-09-10 DIAGNOSIS — I361 Nonrheumatic tricuspid (valve) insufficiency: Secondary | ICD-10-CM | POA: Diagnosis not present

## 2021-09-10 DIAGNOSIS — E785 Hyperlipidemia, unspecified: Secondary | ICD-10-CM

## 2021-09-10 DIAGNOSIS — Z01810 Encounter for preprocedural cardiovascular examination: Secondary | ICD-10-CM

## 2021-09-10 DIAGNOSIS — I7121 Aneurysm of the ascending aorta, without rupture: Secondary | ICD-10-CM | POA: Diagnosis not present

## 2021-09-10 DIAGNOSIS — I10 Essential (primary) hypertension: Secondary | ICD-10-CM | POA: Diagnosis not present

## 2021-09-10 DIAGNOSIS — G4733 Obstructive sleep apnea (adult) (pediatric): Secondary | ICD-10-CM

## 2021-09-10 HISTORY — DX: Encounter for preprocedural cardiovascular examination: Z01.810

## 2021-09-10 NOTE — Telephone Encounter (Signed)
Patient was scheduled for THA for tomorrow.  Upon review by Shanda Bumps, [facility] anesthesia, pt required cardiac clearance due to history of aneurysm in which patient has not been seen in follow up for.  I faxed urgent clearance request to Dr. Bing Matter at [facility] in [place] on Monday, 09-07-21.  Faxed confirmed went through.  On Wednesday, 09-09-21 I had not heard anything so I called the office.  They could not find the clearance request so a telephone call clearance request was entered.  Patient needed appointment,  Refer to [facility] note about conversation with patient.  Patient was seen today by Dr. Bing Matter and he ordered an ECHO so that he could assess and make sure patient okay for surgery.  I called [facility] office in [place] this afternoon to follow up.  Per Selena, ECHO had not yet been scheduled and Dr. Bing Matter needs that to clear patient.  I called patient to advise surgery cancelled and left voice mail.  She apparently did not get message so she called back and asked for me to call her back. I called patient back and advised that surgery cancelled for tomorrow since Dr. needed that test done to clear.  She stated she had just had the test done at [facility].  I told her what I had been told by Dr. Vanetta Shawl office in [facility].  She was very upset and was insisting that she still have surgery tomorrow pending clearance.  I told her surgery already cancelled and would have to find out results then reschedule.  She blamed me for not asking her to have this clearance done sooner  as part of my scheduling process.  I advised her I do not review chart to see what patient needs to follow up on in their health history.  She was very argumentative and said she would show up here tomorrow.  I told her I would not be here.  She then stated she would be here first thing Monday and she wanted to speak to Dr. Magnus Ivan.  I called Dr. Magnus Ivan and made him aware of the situation.  We will  await ECHO results and clearance by Dr. Bing Matter in order to reschedule patient.

## 2021-09-10 NOTE — Patient Instructions (Signed)
Medication Instructions:  Your physician recommends that you continue on your current medications as directed. Please refer to the Current Medication list given to you today.  *If you need a refill on your cardiac medications before your next appointment, please call your pharmacy*   Lab Work: None If you have labs (blood work) drawn today and your tests are completely normal, you will receive your results only by: MyChart Message (if you have MyChart) OR A paper copy in the mail If you have any lab test that is abnormal or we need to change your treatment, we will call you to review the results.   Testing/Procedures: Echo at [facility]    Follow-Up: At [facility], you and your health needs are our priority.  As part of our continuing mission to provide you with exceptional heart care, we have created designated Provider Care Teams.  These Care Teams include your primary Cardiologist (physician) and Advanced Practice Providers (APPs -  Physician Assistants and Nurse Practitioners) who all work together to provide you with the care you need, when you need it.  We recommend signing up for the patient portal called "MyChart".  Sign up information is provided on this After Visit Summary.  MyChart is used to connect with patients for Virtual Visits (Telemedicine).  Patients are able to view lab/test results, encounter notes, upcoming appointments, etc.  Non-urgent messages can be sent to your provider as well.   To learn more about what you can do with MyChart, go to [email or web address].    Your next appointment:   6 month(s)  The format for your next appointment:   In Person  Provider:   [name], MD    Other Instructions

## 2021-09-10 NOTE — Progress Notes (Signed)
Cardiology Office Note:    Date:  09/10/2021   ID:  Alyssa Rosales, DOB Mar 29, 1951, MRN [id number]  PCP:  [name], MD  Cardiologist:  [name], MD    Referring MD: [name], MD   Chief Complaint  Patient presents with   CLearance 09/11/2021    History of Present Illness:    Alyssa Rosales is a 70 y.o. female with past medical history significant for luminal coronary artery disease based on cardiac catheterization done years ago, last stress test last year showing no evidence of ischemia.  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  His abdominal aorta measuring 3 cm.  Also essential hypertension, dyslipidemia, GERD. Should be struggling with hip pain.  In spite of that she was still able to work until about 3 weeks ago.  She was able to walk around with some limping but otherwise it with no problem.  There is no chest pain tightness squeezing pressure burning chest no shortness of breath  Past Medical History:  Diagnosis Date   Abdominal aortic aneurysm (AAA) without rupture 09/01/2017   Arthritis    Coronary artery disease involving native coronary artery of native heart without angina pectoris 02/09/2017   Edema, lower extremity    GERD without esophagitis 01/07/2016   Last Assessment & Plan:  Continue with the PPI and have refilled this for her   History of kidney problems    History of kidney stones    Hyperlipidemia 01/27/2017   Overview:  Added automatically from request for surgery [id number]   Hypertension    Joint pain    Kidney stone    Moderate persistent asthma without complication 08/10/2016   OSA (obstructive sleep apnea) 09/30/2016   Follows with pulmonary is seen 12/19 for FU bipap 21/15   Shortness of breath    Vitamin D deficiency     Past Surgical History:  Procedure Laterality Date   AMPUTATION Right 10/21/2020   Procedure: AMPUTATION RIGHT MIDDLE FINGER;  Surgeon: Cindee Salt, MD;  Location: [facility];  Service: Orthopedics;  Laterality: Right;  IV REGIONAL FOREARM BLOCK, BIER BLOCK   CORONARY ANGIOPLASTY     INCISION AND DRAINAGE Right 11/14/2020   Procedure: INCISION AND DRAINAGE RIGHT MIDDLE FINGER;  Surgeon: Cindee Salt, MD;  Location: [facility];  Service: Orthopedics;  Laterality: Right;   kidney stone removal     RECTAL SURGERY     spur removal     TOOTH EXTRACTION      Current Medications: Current Meds  Medication Sig   albuterol (VENTOLIN HFA) 108 (90 Base) MCG/ACT inhaler Inhale 2 puffs into the lungs every 6 (six) hours as needed for wheezing or shortness of breath.   amLODipine (NORVASC) 2.5 MG tablet Take 2.5 mg by mouth daily.   Apoaequorin (PREVAGEN EXTRA STRENGTH PO) Take 1 tablet by mouth daily. Unknown strength   APPLE CIDER VINEGAR PO Take 2 tablets by mouth daily. Unknown strength   Cyanocobalamin (VITAMIN B-12 PO) Take 2,500 mcg by mouth daily.   ibuprofen (ADVIL) 800 MG tablet Take 800 mg by mouth every 8 (eight) hours as needed for mild pain.   loperamide (IMODIUM) 2 MG capsule Take 2 mg by mouth as needed for diarrhea or loose stools.   losartan (COZAAR) 100 MG tablet Take 100 mg by mouth daily.   meclizine (ANTIVERT) 25 MG tablet Take 25 mg by mouth 3 (three) times daily as needed.   metoprolol  succinate (TOPROL-XL) 25 MG 24 hr tablet TAKE ONE (1) TABLET BY MOUTH ONCE DAILY (Patient taking differently: Take 25 mg by mouth daily.)   niacin 500 MG tablet Take 500 mg by mouth at bedtime.   nitroGLYCERIN (NITROSTAT) 0.4 MG SL tablet Place 1 tablet (0.4 mg total) under the tongue every 5 (five) minutes as needed for up to 10 doses for chest pain.   OVER THE COUNTER MEDICATION Take 1 tablet by mouth daily. UStrengthApricot kernel extract   OVER THE COUNTER MEDICATION Take 1 tablet by mouth daily. Vit K 90 mcg + vit D 5,000 units   pantoprazole (PROTONIX) 40 MG tablet Take 40 mg by mouth daily.   pyridoxine (B-6) 500 MG tablet Take  500 mg by mouth daily.   torsemide (DEMADEX) 10 MG tablet Take 1 tablet by mouth 2 (two) times daily.   traMADol (ULTRAM) 50 MG tablet Take 1 tablet (50 mg total) by mouth every 6 (six) hours as needed. (Patient taking differently: Take 50 mg by mouth every 8 (eight) hours as needed for moderate pain.)   Vitamin D, Ergocalciferol, (DRISDOL) 1.25 MG (50000 UT) CAPS capsule Take 50,000 Units by mouth once a week.     Allergies:   Amoxicillin-pot clavulanate, Penicillins, Propoxyphene, and Sulfamethoxazole   Social History   Socioeconomic History   Marital status: Divorced    Spouse name: Not on file   Number of children: Not on file   Years of education: Not on file   Highest education level: Not on file  Occupational History   Occupation: [employer]   Tobacco Use   Smoking status: Never   Smokeless tobacco: Never  Vaping Use   Vaping Use: Never used  Substance and Sexual Activity   Alcohol use: Never   Drug use: Never   Sexual activity: Not on file  Other Topics Concern   Not on file  Social History Narrative   Not on file   Social Determinants of Health   Financial Resource Strain: Not on file  Food Insecurity: Not on file  Transportation Needs: Not on file  Physical Activity: Not on file  Stress: Not on file  Social Connections: Not on file     Family History: The patient's family history includes Alzheimer's disease in her mother; Diabetes in her mother; Hyperlipidemia in her father and mother; Hypertension in her father and mother. There is no history of Breast cancer. ROS:   Please see the history of present illness.    All 14 point review of systems negative except as described per history of present illness  EKGs/Labs/Other Studies Reviewed:      Recent Labs: 09/03/2021: BUN 33; Creatinine, Ser 0.81; Hemoglobin 13.6; Platelets 201; Potassium 3.3; Sodium 141  Recent Lipid Panel    Component Value Date/Time   CHOL 206 (H) 11/27/2019 1553   TRIG 90  11/27/2019 1553   HDL 57 11/27/2019 1553   CHOLHDL 4.1 11/09/2018 1040   LDLCALC 133 (H) 11/27/2019 1553    Physical Exam:    VS:  BP 140/80 (BP Location: Right Arm, Patient Position: Sitting)   Pulse (!) 57   Ht 4' 8.5" (1.435 m)   Wt 190 lb (86.2 kg)   SpO2 95%   BMI 41.85 kg/m     Wt Readings from Last 3 Encounters:  09/10/21 190 lb (86.2 kg)  09/03/21 185 lb 3.2 oz (84 kg)  07/07/21 192 lb (87.1 kg)     GEN:  Well nourished, well developed in no  acute distress HEENT: Normal NECK: No JVD; No carotid bruits LYMPHATICS: No lymphadenopathy CARDIAC: RRR, no murmurs, no rubs, no gallops RESPIRATORY:  Clear to auscultation without rales, wheezing or rhonchi  ABDOMEN: Soft, non-tender, non-distended MUSCULOSKELETAL:  No edema; No deformity SKIN: Warm and dry LOWER EXTREMITIES: no swelling NEUROLOGIC:  Alert and oriented x 3 PSYCHIATRIC:  Normal affect   ASSESSMENT:    1. Essential hypertension   2. Aneurysm of ascending aorta without rupture   3. OSA (obstructive sleep apnea)   4. Dyslipidemia   5. Preop cardiovascular exam    PLAN:    In order of problems listed above:  Cardiovascular preop evaluation for this lady with luminal disease based on cardiac catheterization, last evaluation last year showing no evidence of ischemia based on stress test.  She does not have any symptomatology suggest coronary artery disease.  There is no chest pain tightness squeezing pressure burning chest.  Until 3 weeks ago she was able to walk and help.  She still works in spite of her age.  Therefore I do not think we need to perform any CAD evaluation at this stage.  Since she does have history of stenting aortic aneurysm I think it will be reasonable to perform echocardiogram to check the size of the arteries and also check left ventricle ejection fraction.  If echocardiogram did not show any significant enlargement of her ascending aneurysm she would be acceptable candidate for surgery  from cardiac standpoint reviewed. Ascending aortic aneurysm: Plan as described above we will do echocardiogram to look at the size. Essential hypertension slightly elevated today but she is very upset about this entire scenario. Dyslipidemia, last lipid profile from 5 months ago with LDL of 125.  After surgery we will talk about reinitiation of her medications for her cholesterol    Medication Adjustments/Labs and Tests Ordered: Current medicines are reviewed at length with the patient today.  Concerns regarding medicines are outlined above.  Orders Placed This Encounter  Procedures   EKG 12-Lead   Medication changes: No orders of the defined types were placed in this encounter.   Signed, Georgeanna Lea, MD, [facility] 09/10/2021 1:45 PM    [facility]

## 2021-09-11 ENCOUNTER — Ambulatory Visit (HOSPITAL_COMMUNITY): Admission: RE | Admit: 2021-09-11 | Payer: Medicare Other | Source: Home / Self Care | Admitting: Orthopaedic Surgery

## 2021-09-11 ENCOUNTER — Encounter (HOSPITAL_COMMUNITY): Admission: RE | Payer: Self-pay | Source: Home / Self Care

## 2021-09-11 DIAGNOSIS — I10 Essential (primary) hypertension: Secondary | ICD-10-CM

## 2021-09-11 DIAGNOSIS — Z01818 Encounter for other preprocedural examination: Secondary | ICD-10-CM

## 2021-09-11 LAB — TYPE AND SCREEN
ABO/RH(D): O POS
Antibody Screen: NEGATIVE

## 2021-09-11 SURGERY — ARTHROPLASTY, HIP, TOTAL, ANTERIOR APPROACH
Anesthesia: Spinal | Site: Hip | Laterality: Left

## 2021-09-14 ENCOUNTER — Other Ambulatory Visit: Payer: Self-pay | Admitting: Orthopaedic Surgery

## 2021-09-14 ENCOUNTER — Other Ambulatory Visit: Payer: Self-pay

## 2021-09-14 ENCOUNTER — Ambulatory Visit (INDEPENDENT_AMBULATORY_CARE_PROVIDER_SITE_OTHER): Payer: Medicare Other | Admitting: Cardiology

## 2021-09-14 ENCOUNTER — Telehealth: Payer: Self-pay | Admitting: Orthopaedic Surgery

## 2021-09-14 ENCOUNTER — Encounter: Payer: Self-pay | Admitting: Cardiology

## 2021-09-14 ENCOUNTER — Ambulatory Visit: Payer: Medicare Other | Admitting: Cardiovascular Disease

## 2021-09-14 VITALS — BP 164/102 | HR 62 | Ht <= 58 in | Wt 190.0 lb

## 2021-09-14 DIAGNOSIS — E7849 Other hyperlipidemia: Secondary | ICD-10-CM

## 2021-09-14 DIAGNOSIS — I2510 Atherosclerotic heart disease of native coronary artery without angina pectoris: Secondary | ICD-10-CM | POA: Diagnosis not present

## 2021-09-14 DIAGNOSIS — Q211 Atrial septal defect, unspecified: Secondary | ICD-10-CM

## 2021-09-14 HISTORY — DX: Atrial septal defect, unspecified: Q21.10

## 2021-09-14 MED ORDER — ACETAMINOPHEN-CODEINE #3 300-30 MG PO TABS: 1.0000 | ORAL_TABLET | Freq: Three times a day (TID) | ORAL | 0 refills | Status: DC | PRN

## 2021-09-14 NOTE — Telephone Encounter (Signed)
Office calling for update on clearance. Please advise

## 2021-09-14 NOTE — Telephone Encounter (Signed)
Pt came in stating she was supposed t have surgery 09/11/21 and it was cancelled. Pt would like a CB to explain what's going on and to get another surgery date scheduled. Pt would also like to have something called in for pain and would like a CB in regards to that as well.   [phone]

## 2021-09-14 NOTE — Patient Instructions (Signed)
Medication Instructions:  Your physician recommends that you continue on your current medications as directed. Please refer to the Current Medication list given to you today.  *If you need a refill on your cardiac medications before your next appointment, please call your pharmacy*   Lab Work: None If you have labs (blood work) drawn today and your tests are completely normal, you will receive your results only by: MyChart Message (if you have MyChart) OR A paper copy in the mail If you have any lab test that is abnormal or we need to change your treatment, we will call you to review the results.   Testing/Procedures: Your physician has requested that you have a cardiac MRI. Cardiac MRI uses a computer to create images of your heart as its beating, producing both still and moving pictures of your heart and major blood vessels. For further information please visit [email or web address]. Please follow the instruction sheet given to you today for more information.    Follow-Up: At [facility], you and your health needs are our priority.  As part of our continuing mission to provide you with exceptional heart care, we have created designated Provider Care Teams.  These Care Teams include your primary Cardiologist (physician) and Advanced Practice Providers (APPs -  Physician Assistants and Nurse Practitioners) who all work together to provide you with the care you need, when you need it.  We recommend signing up for the patient portal called "MyChart".  Sign up information is provided on this After Visit Summary.  MyChart is used to connect with patients for Virtual Visits (Telemedicine).  Patients are able to view lab/test results, encounter notes, upcoming appointments, etc.  Non-urgent messages can be sent to your provider as well.   To learn more about what you can do with MyChart, go to [email or web address].    Your next appointment:   2 month(s)  The format for your next appointment:    In Person  Provider:   [name], MD    Other Instructions

## 2021-09-14 NOTE — Telephone Encounter (Signed)
I have left a message with [facility] for an update on her clearance.  Thanks!

## 2021-09-14 NOTE — Progress Notes (Signed)
Cardiology Office Note:    Date:  09/14/2021   ID:  Alyssa Rosales, DOB [birth year], MRN [id number]  PCP:  [name], MD  Cardiologist:  [name], MD    Referring MD: [name], MD   Chief Complaint  Patient presents with   Follow-up  I want to have my surgery, I want to go back to work  History of Present Illness:    Alyssa Rosales is a 70 y.o. female with past medical history significant for coronary artery disease only luminal disease based on cardiac cath from 2018, ascending aortic aneurysm measuring 43 mm, essential hypertension, dyslipidemia.  She showed up in my office last week day before her elective hip replacement surgery.  Because of inability to walk because of her hip pain as well as the fact that she does have ascending aortic aneurysm we decided to do a quick echocardiogram which showed large ASD she was noted to have normal pulmonary pressure, right ventricle right atrium is already enlarged.  Shunt is 1-2.18.  She comes today to my office for follow-up.  She is crying.  She wants to go back to work.  She wants to have her surgery done.  I told her that this is the need to be investigated before we elected to have surgery.  Past Medical History:  Diagnosis Date   Abdominal aortic aneurysm (AAA) without rupture 09/01/2017   Arthritis    Coronary artery disease involving native coronary artery of native heart without angina pectoris 02/09/2017   Edema, lower extremity    GERD without esophagitis 01/07/2016   Last Assessment & Plan:  Continue with the PPI and have refilled this for her   History of kidney problems    History of kidney stones    Hyperlipidemia 01/27/2017   Overview:  Added automatically from request for surgery [id number]   Hypertension    Joint pain    Kidney stone    Moderate persistent asthma without complication 08/10/2016   OSA (obstructive sleep apnea) 09/30/2016   Follows with pulmonary is seen 12/19 for FU bipap  21/15   Shortness of breath    Vitamin D deficiency     Past Surgical History:  Procedure Laterality Date   AMPUTATION Right 10/21/2020   Procedure: AMPUTATION RIGHT MIDDLE FINGER;  Surgeon: Cindee Salt, MD;  Location: [facility];  Service: Orthopedics;  Laterality: Right;  IV REGIONAL FOREARM BLOCK, BIER BLOCK   CORONARY ANGIOPLASTY     INCISION AND DRAINAGE Right 11/14/2020   Procedure: INCISION AND DRAINAGE RIGHT MIDDLE FINGER;  Surgeon: Cindee Salt, MD;  Location: [facility];  Service: Orthopedics;  Laterality: Right;   kidney stone removal     RECTAL SURGERY     spur removal     TOOTH EXTRACTION      Current Medications: Current Meds  Medication Sig   acetaminophen-codeine (TYLENOL #3) 300-30 MG tablet Take 1-2 tablets by mouth every 8 (eight) hours as needed.   albuterol (VENTOLIN HFA) 108 (90 Base) MCG/ACT inhaler Inhale 2 puffs into the lungs every 6 (six) hours as needed for wheezing or shortness of breath.   amLODipine (NORVASC) 2.5 MG tablet Take 2.5 mg by mouth daily.   Apoaequorin (PREVAGEN EXTRA STRENGTH PO) Take 1 tablet by mouth daily. Unknown strength   APPLE CIDER VINEGAR PO Take 2 tablets by mouth daily. Unknown strength   Cyanocobalamin (VITAMIN B-12 PO) Take 2,500 mcg by mouth daily.   ibuprofen (ADVIL) 800  MG tablet Take 800 mg by mouth every 8 (eight) hours as needed for mild pain.   loperamide (IMODIUM) 2 MG capsule Take 2 mg by mouth as needed for diarrhea or loose stools.   losartan (COZAAR) 100 MG tablet Take 100 mg by mouth daily.   meclizine (ANTIVERT) 25 MG tablet Take 25 mg by mouth 3 (three) times daily as needed.   metoprolol succinate (TOPROL-XL) 25 MG 24 hr tablet TAKE ONE (1) TABLET BY MOUTH ONCE DAILY   niacin 500 MG tablet Take 500 mg by mouth at bedtime.   nitroGLYCERIN (NITROSTAT) 0.4 MG SL tablet Place 1 tablet (0.4 mg total) under the tongue every 5 (five) minutes as needed for up to 10 doses for chest pain.    OVER THE COUNTER MEDICATION Take 1 tablet by mouth daily. UStrengthApricot kernel extract   OVER THE COUNTER MEDICATION Take 1 tablet by mouth daily. Vit K 90 mcg + vit D 5,000 units   pantoprazole (PROTONIX) 40 MG tablet Take 40 mg by mouth daily.   pyridoxine (B-6) 500 MG tablet Take 500 mg by mouth daily.   torsemide (DEMADEX) 10 MG tablet Take 1 tablet by mouth 2 (two) times daily.   traMADol (ULTRAM) 50 MG tablet Take 1 tablet (50 mg total) by mouth every 6 (six) hours as needed.   Vitamin D, Ergocalciferol, (DRISDOL) 1.25 MG (50000 UT) CAPS capsule Take 50,000 Units by mouth once a week.     Allergies:   Amoxicillin-pot clavulanate, Penicillins, Propoxyphene, and Sulfamethoxazole   Social History   Socioeconomic History   Marital status: Divorced    Spouse name: Not on file   Number of children: Not on file   Years of education: Not on file   Highest education level: Not on file  Occupational History   Occupation: [employer]   Tobacco Use   Smoking status: Never   Smokeless tobacco: Never  Vaping Use   Vaping Use: Never used  Substance and Sexual Activity   Alcohol use: Never   Drug use: Never   Sexual activity: Not on file  Other Topics Concern   Not on file  Social History Narrative   Not on file   Social Determinants of Health   Financial Resource Strain: Not on file  Food Insecurity: Not on file  Transportation Needs: Not on file  Physical Activity: Not on file  Stress: Not on file  Social Connections: Not on file     Family History: The patient's family history includes Alzheimer's disease in her mother; Diabetes in her mother; Hyperlipidemia in her father and mother; Hypertension in her father and mother. There is no history of Breast cancer. ROS:   Please see the history of present illness.    All 14 point review of systems negative except as described per history of present illness  EKGs/Labs/Other Studies Reviewed:      Recent Labs: 09/03/2021:  BUN 33; Creatinine, Ser 0.81; Hemoglobin 13.6; Platelets 201; Potassium 3.3; Sodium 141  Recent Lipid Panel    Component Value Date/Time   CHOL 206 (H) 11/27/2019 1553   TRIG 90 11/27/2019 1553   HDL 57 11/27/2019 1553   CHOLHDL 4.1 11/09/2018 1040   LDLCALC 133 (H) 11/27/2019 1553    Physical Exam:    VS:  BP (!) 164/102 (BP Location: Right Arm, Patient Position: Sitting, Cuff Size: Normal)   Pulse 62   Ht 4' 8.5" (1.435 m)   Wt 190 lb (86.2 kg)   SpO2 97%  BMI 41.85 kg/m     Wt Readings from Last 3 Encounters:  09/14/21 190 lb (86.2 kg)  09/10/21 190 lb (86.2 kg)  09/03/21 185 lb 3.2 oz (84 kg)     GEN:  Well nourished, well developed in no acute distress HEENT: Normal NECK: No JVD; No carotid bruits LYMPHATICS: No lymphadenopathy CARDIAC: RRR, no murmurs, no rubs, no gallops RESPIRATORY:  Clear to auscultation without rales, wheezing or rhonchi  ABDOMEN: Soft, non-tender, non-distended MUSCULOSKELETAL:  No edema; No deformity  SKIN: Warm and dry LOWER EXTREMITIES: no swelling NEUROLOGIC:  Alert and oriented x 3 PSYCHIATRIC:  Normal affect   ASSESSMENT:    1. Coronary artery disease involving native coronary artery of native heart without angina pectoris   2. Atrial septal defect   3. Other hyperlipidemia    PLAN:    In order of problems listed above:  Coronary disease only luminal disease based on cardiac cath from 2018.  No symptomatology suggest reactivation of the problem. Atrial septal defect which is a new discovery.  She will be scheduled to have MRI to assess specifically degree of shunt to look at the size and function of the right ventricle.  Obviously we will look at the size of left atrium and right atrium as well.  Based on that we will make a decision how to proceed.  Until then we will hold with her elective hip surgery. Dyslipidemia,I did review her K PN which show LDL 133 and HDL 57.  This is discussion that we will continue in terms of  potential treatment for it   Medication Adjustments/Labs and Tests Ordered: Current medicines are reviewed at length with the patient today.  Concerns regarding medicines are outlined above.  No orders of the defined types were placed in this encounter.  Medication changes: No orders of the defined types were placed in this encounter.   Signed, Georgeanna Lea, MD, [facility] 09/14/2021 3:32 PM    [facility]

## 2021-09-14 NOTE — Telephone Encounter (Signed)
Pt was seen in office 09/10/21 by Dr. Bing Matter for pre-operative evaluation. He recommended echocardiogram. Report is scanned but not yet resulted by MD. Commentary not yet available on clearance decision.  Will route to Dr. Bing Matter to advise. Per office protocol, since patient was evaluated in person by provider for clearance, the provider should assess clearance at completion of studies and should forward their finalized clearance decision to requesting party below. Separate preop APP input not needed at this time, will remove from preop box.

## 2021-09-14 NOTE — Telephone Encounter (Signed)
Please advise 

## 2021-09-14 NOTE — Telephone Encounter (Addendum)
   Patient Name: Alyssa Rosales  DOB: 1951/08/21 MRN: [id number]  Primary Cardiologist: [name], MD  Chart reviewed as part of pre-operative protocol coverage. Per Dr. Bing Matter: "surprisingly she was find to have ASD, she will require MRI of the heart which we already schedule before surgery"  Finalized clearance forthcoming from MD once completed.   Will route this bundled recommendation and MD OV note from today to requesting provider via Epic fax function. Please call with questions.   Laurann Montana, PA-C 09/14/2021, 5:10 PM

## 2021-09-14 NOTE — Telephone Encounter (Signed)
Please advise patient about pain medication.  Still awaiting clearance decision by Dr. Bing Matter.

## 2021-09-14 NOTE — Telephone Encounter (Signed)
Pt was informed, per [employer]

## 2021-09-15 ENCOUNTER — Telehealth: Payer: Self-pay | Admitting: Emergency Medicine

## 2021-09-15 NOTE — Telephone Encounter (Signed)
Patient called. She needs a RX for a medication for claustrophobia for her mri. Will check with Dr. Bing Matter.

## 2021-09-16 MED ORDER — LORAZEPAM 1 MG PO TABS: ORAL_TABLET | ORAL | 0 refills | Status: DC

## 2021-09-16 NOTE — Telephone Encounter (Signed)
Rx order placed. This medication requires a printed prescription. Left message for patient to return call to inform her to pick this up.

## 2021-09-16 NOTE — Addendum Note (Signed)
Addended by: Hazle Quant on: 09/16/2021 02:57 PM   Modules accepted: Orders

## 2021-09-16 NOTE — Telephone Encounter (Signed)
Patient aware to pick up rx. °

## 2021-09-28 ENCOUNTER — Encounter: Payer: Medicare Other | Admitting: Orthopaedic Surgery

## 2021-10-21 ENCOUNTER — Telehealth (HOSPITAL_COMMUNITY): Payer: Self-pay | Admitting: Emergency Medicine

## 2021-10-21 NOTE — Telephone Encounter (Signed)
Reaching out to patient to offer assistance regarding upcoming cardiac imaging study; pt verbalizes understanding of appt date/time, parking situation and where to check in, and verified current allergies; name and call back number provided for further questions should they arise Rockwell Alexandria RN Navigator Cardiac Imaging [facility] [phone] office [phone] cell  Has designated driver for ativan  Denies implants 1mg  ativan for claustro

## 2021-10-22 ENCOUNTER — Other Ambulatory Visit: Payer: Self-pay

## 2021-10-22 ENCOUNTER — Ambulatory Visit (HOSPITAL_COMMUNITY)
Admission: RE | Admit: 2021-10-22 | Discharge: 2021-10-22 | Disposition: A | Discharge status: Home / Self Care | Payer: Medicare Other | Source: Ambulatory Visit | Attending: Cardiology | Admitting: Cardiology

## 2021-10-22 DIAGNOSIS — Q211 Atrial septal defect, unspecified: Secondary | ICD-10-CM | POA: Diagnosis present

## 2021-10-22 IMAGING — MR MR CARD MORPHOLOGY WO/W CM
43 of 48 series · 43 of 48 positions shown · IV contrast (gadavist)
Comparison: none

Addendum:
CLINICAL DATA: Evaluate ASD

EXAM:
CARDIAC MRI
TECHNIQUE: The patient was scanned on a 1.5 Tesla Siemens magnet. A dedicated
cardiac coil was used. Functional imaging was done using Fiesta
sequences. [DATE], and 4 chamber views were done to assess for RWMA's.
Modified VIOLETA rule using a short axis stack was used to
calculate an ejection fraction on a dedicated work station using
Circle software. The patient received 8 cc of Gadavist. After 10
minutes inversion recovery sequences were used to assess for
infiltration and scar tissue.
CONTRAST:  8 cc  of Gadavist

[Series 4: [id number]_bh · axial · 8.0mm · 1.56mm/px · 1 of 16 slices shown]
[im 1/16]
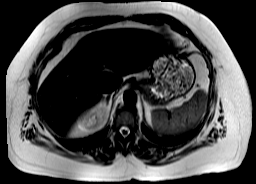

[Series 8: bSSFP · oblique · 8.0mm · 1.70mm/px · 1 of 25 slices shown (1 of 22)]
[im 1/25]
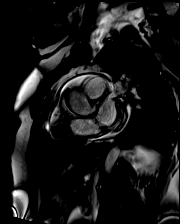

[Series 9: bSSFP · oblique · 8.0mm · 1.70mm/px · 1 of 25 slices shown (2 of 22)]
[im 1/25]
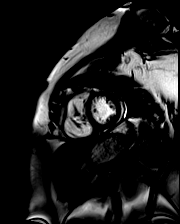

[Series 10: bSSFP · oblique · 8.0mm · 1.70mm/px · 1 of 25 slices shown (3 of 22)]
[im 1/25]
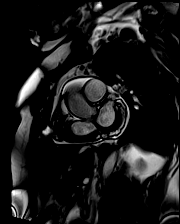

[Series 11: bSSFP · oblique · 8.0mm · 1.70mm/px · 1 of 25 slices shown (4 of 22)]
[im 1/25]
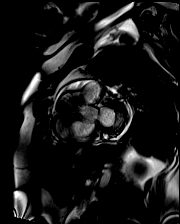

[Series 12: bSSFP · oblique · 8.0mm · 1.70mm/px · 1 of 18 slices shown (5 of 22)]
[im 1/18]
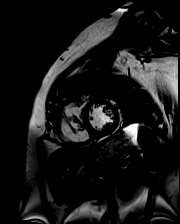

[Series 13: bSSFP · oblique · 8.0mm · 1.70mm/px · 1 of 18 slices shown (6 of 22)]
[im 1/18]
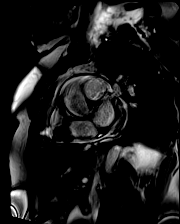

[Series 14: bSSFP · oblique · 8.0mm · 1.70mm/px · 1 of 18 slices shown (7 of 22)]
[im 1/18]
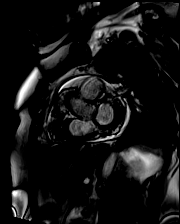

[Series 15: bSSFP · oblique · 8.0mm · 1.70mm/px · 1 of 18 slices shown (8 of 22)]
[im 1/18]
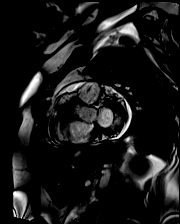

[Series 16: bSSFP · oblique · 8.0mm · 1.70mm/px · 1 of 18 slices shown (9 of 22)]
[im 1/18]
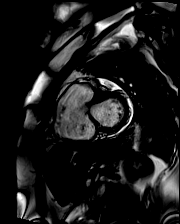

[Series 17: bSSFP · oblique · 8.0mm · 1.70mm/px · 1 of 18 slices shown (10 of 22)]
[im 1/18]
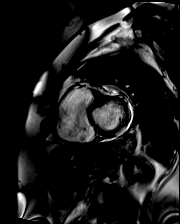

[Series 18: bSSFP · oblique · 8.0mm · 1.70mm/px · 1 of 18 slices shown (11 of 22)]
[im 1/18]
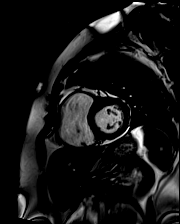

[Series 19: bSSFP · oblique · 8.0mm · 1.70mm/px · 1 of 18 slices shown (12 of 22)]
[im 1/18]
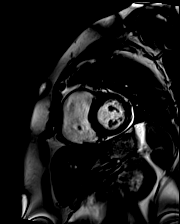

[Series 20: bSSFP · oblique · 8.0mm · 1.70mm/px · 1 of 18 slices shown (13 of 22)]
[im 1/18]
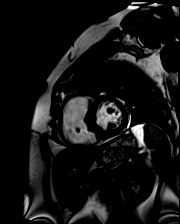

[Series 21: bSSFP · oblique · 8.0mm · 1.70mm/px · 1 of 18 slices shown (14 of 22)]
[im 1/18]
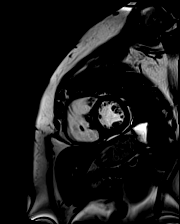

[Series 22: bSSFP · oblique · 8.0mm · 1.70mm/px · 1 of 25 slices shown (15 of 22)]
[im 1/25]
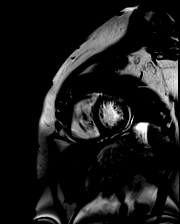

[Series 23: bSSFP · oblique · 8.0mm · 1.70mm/px · 1 of 25 slices shown (16 of 22)]
[im 1/25]
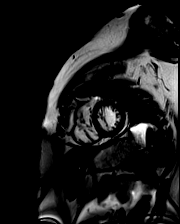

[Series 24: bSSFP · oblique · 8.0mm · 1.70mm/px · 1 of 25 slices shown (17 of 22)]
[im 1/25]
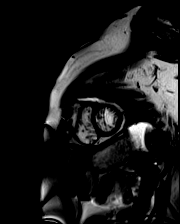

[Series 25: bSSFP · oblique · 8.0mm · 1.70mm/px · 1 of 25 slices shown (18 of 22)]
[im 1/25]
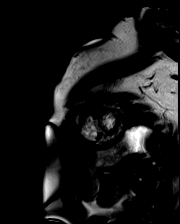

[Series 26: bSSFP · oblique · 8.0mm · 1.70mm/px · 1 of 25 slices shown (19 of 22)]
[im 1/25]
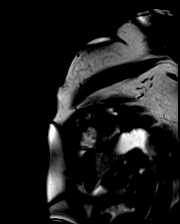

[Series 27: bSSFP · oblique · 8.0mm · 1.70mm/px · 1 of 25 slices shown (20 of 22)]
[im 1/25]
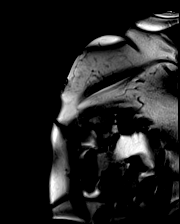

[Series 28: (id)_long_t1 · oblique · 8.0mm · 1.56mm/px · 1 of 24 slices shown]
[im 1/24]
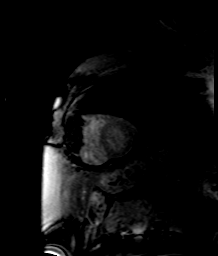

[Series 29: (id)_long_t1_moco · oblique · 8.0mm · 1.56mm/px · 1 of 24 slices shown]
[im 1/24]
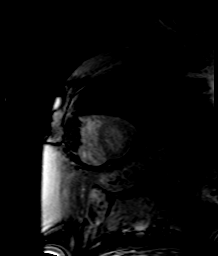

[Series 32: (id)_trufi · oblique · 8.0mm · 2.08mm/px · 1 of 9 slices shown]
[im 1/9]
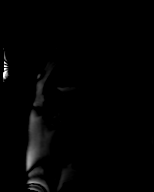

[Series 33: (id)_trufi_moco · oblique · 8.0mm · 2.08mm/px · 1 of 9 slices shown]
[im 1/9]
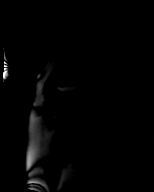

[Series 36: bSSFP · axial · 6.0mm · 1.41mm/px · 1 of 25 slices shown (21 of 22)]
[im 1/25]
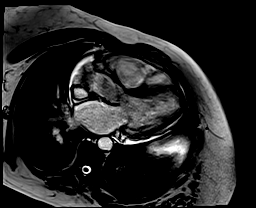

[Series 37: bSSFP · oblique · 6.0mm · 1.41mm/px · 1 of 25 slices shown (22 of 22)]
[im 1/25]
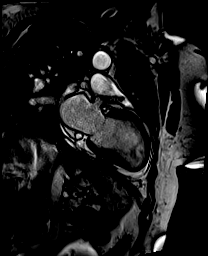

[Series 45: cine_trufi_cs_rt_short axis · axial · 8.0mm · 1.92mm/px · 1 of 14 slices shown (1 of 16)]
[im 1/14]
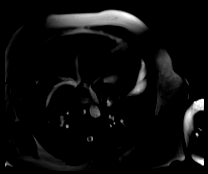

[Series 45: cine_trufi_cs_rt_short axis · axial · 8.0mm · 1.92mm/px · 1 of 14 slices shown (2 of 16)]
[im 1/14]
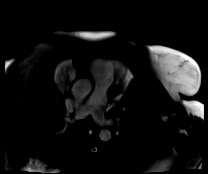

[Series 45: cine_trufi_cs_rt_short axis · axial · 8.0mm · 1.92mm/px · 1 of 14 slices shown (3 of 16)]
[im 1/14]
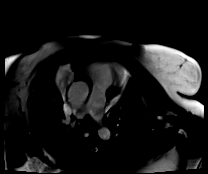

[Series 45: cine_trufi_cs_rt_short axis · axial · 8.0mm · 1.92mm/px · 1 of 14 slices shown (4 of 16)]
[im 1/14]
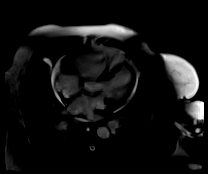

[Series 45: cine_trufi_cs_rt_short axis · axial · 8.0mm · 1.92mm/px · 1 of 14 slices shown (5 of 16)]
[im 1/14]
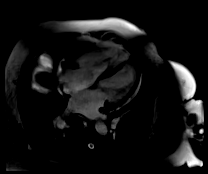

[Series 45: cine_trufi_cs_rt_short axis · axial · 8.0mm · 1.92mm/px · 1 of 14 slices shown (6 of 16)]
[im 1/14]
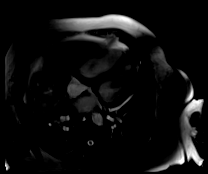

[Series 45: cine_trufi_cs_rt_short axis · axial · 8.0mm · 1.92mm/px · 1 of 14 slices shown (7 of 16)]
[im 1/14]
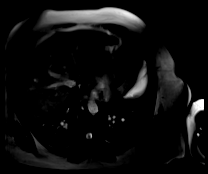

[Series 45: cine_trufi_cs_rt_short axis · axial · 8.0mm · 1.92mm/px · 1 of 14 slices shown (8 of 16)]
[im 1/14]
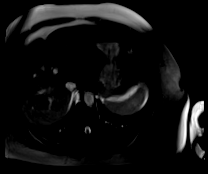

[Series 45: cine_trufi_cs_rt_short axis · axial · 8.0mm · 1.92mm/px · 1 of 14 slices shown (9 of 16)]
[im 1/14]
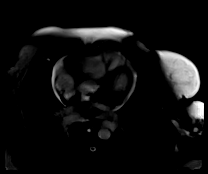

[Series 45: cine_trufi_cs_rt_short axis · axial · 8.0mm · 1.92mm/px · 1 of 14 slices shown (10 of 16)]
[im 1/14]
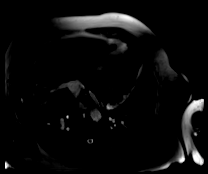

[Series 45: cine_trufi_cs_rt_short axis · axial · 8.0mm · 1.92mm/px · 1 of 14 slices shown (11 of 16)]
[im 1/14]
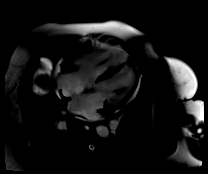

[Series 45: cine_trufi_cs_rt_short axis · axial · 8.0mm · 1.92mm/px · 1 of 14 slices shown (12 of 16)]
[im 1/14]
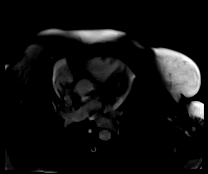

[Series 45: cine_trufi_cs_rt_short axis · axial · 8.0mm · 1.92mm/px · 1 of 14 slices shown (13 of 16)]
[im 1/14]
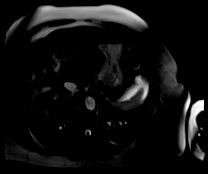

[Series 45: cine_trufi_cs_rt_short axis · axial · 8.0mm · 1.92mm/px · 1 of 14 slices shown (14 of 16)]
[im 1/14]
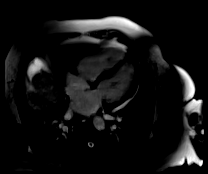

[Series 47: cine_trufi_cs_rt_short axis · oblique · 8.0mm · 1.92mm/px · 1 of 14 slices shown (15 of 16)]
[im 1/14]
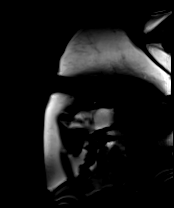

[Series 47: cine_trufi_cs_rt_short axis · oblique · 8.0mm · 1.92mm/px · 1 of 14 slices shown (16 of 16)]
[im 1/14]
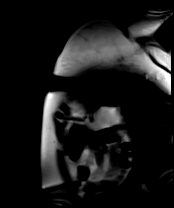

[43 of 48 positions shown; findings below may reference images not displayed]

FINDINGS: Left ventricle:

-Asymmetric hypertrophy measuring up to 14mm in basal septum (8mm in
posterior wall)

-Normal size

-Low normal systolic function

-Normal ECV (28%)

-No LGE

LV EF:  51% (Normal 56-78%)

Absolute volumes:

LV EDV: 141mL (Normal 52-141 mL)

LV ESV: 70mL (Normal 13-51 mL)

LV SV: 72mL (Normal 33-97 mL)

CO: 5.0L/min (Normal 2.7-6.0 L/min)

Indexed volumes:

LV EDV: 76mL/sq-m (Normal 41-81 mL/sq-m)

LV ESV: 38mL/sq-m (Normal 12-21 mL/sq-m)

LV SV: 39mL/sq-m (Normal 26-56 mL/sq-m)

CI: 2.7L/min/sq-m (Normal 1.8-3.8 L/min/sq-m)

Right ventricle: Moderate dilatation with normal systolic function

RV EF: 52% (Normal 47-80%)

Absolute volumes:

RV EDV: 257mL (Normal 58-154 mL)

RV ESV: 123mL (Normal 12-68 mL)

RV SV: 134mL (Normal 35-98 mL)

CO: 9.4L/min (Normal 2.7-6 L/min)

Indexed volumes:

RV EDV: 138mL/sq-m (Normal 48-87 mL/sq-m)

RV ESV: 66mL/sq-m (Normal 11-28 mL/sq-m)

RV SV: 72mL/sq-m (Normal 27-57 mL/sq-m)

CI: 5.1L/min/sq-m (Normal 1.8-3.8 L/min/sq-m)

Left atrium: Moderate enlargement

Right atrium: Moderate enlargement

Mitral valve: Trivial regurgitation

Aortic valve: Trivial regurgitation

Tricuspid valve: Trivial regurgitation

Pulmonic valve: Mild regurgitation

Aorta: Ascending aortic aneurysm measuring 45mm

Pulmonary artery: Dilated main PA measuring 35mm

Pericardium: Moderate pericardial effusion
IMPRESSION: 1. Patient has known ASD, and while interatrial septum is poorly
visualized on current study due to motion artifact, there does
appear to be right to left flow across the interatrial septum. Qp/Qs
=2.1, and RV is moderately enlarged, suggesting significant shunt

2.  Moderate RV dilatation with normal systolic function (EF 52%)

3.  Normal LV size and low normal systolic function (EF 51%)

4. Asymmetric LV hypertrophy measuring 14mm in basal septum (8mm in
posterior wall), not meeting criteria for hypertrophic
cardiomyopathy (<15mm)

5.  No late gadolinium enhancement to suggest myocardial scar

6. Moderate pericardial effusion

7. Ascending aortic aneurysm measuring 45mm

8. Dilated main pulmonary artery measuring 35mm

ADDENDUM:
Flow across the interatrial septum is left to right (not right to
left as stated in the impressions)

*** End of Addendum ***
FINDINGS: Left ventricle:

-Asymmetric hypertrophy measuring up to 14mm in basal septum (8mm in
posterior wall)

-Normal size

-Low normal systolic function

-Normal ECV (28%)

-No LGE

LV EF:  51% (Normal 56-78%)

Absolute volumes:

LV EDV: 141mL (Normal 52-141 mL)

LV ESV: 70mL (Normal 13-51 mL)

LV SV: 72mL (Normal 33-97 mL)

CO: 5.0L/min (Normal 2.7-6.0 L/min)

Indexed volumes:

LV EDV: 76mL/sq-m (Normal 41-81 mL/sq-m)

LV ESV: 38mL/sq-m (Normal 12-21 mL/sq-m)

LV SV: 39mL/sq-m (Normal 26-56 mL/sq-m)

CI: 2.7L/min/sq-m (Normal 1.8-3.8 L/min/sq-m)

Right ventricle: Moderate dilatation with normal systolic function

RV EF: 52% (Normal 47-80%)

Absolute volumes:

RV EDV: 257mL (Normal 58-154 mL)

RV ESV: 123mL (Normal 12-68 mL)

RV SV: 134mL (Normal 35-98 mL)

CO: 9.4L/min (Normal 2.7-6 L/min)

Indexed volumes:

RV EDV: 138mL/sq-m (Normal 48-87 mL/sq-m)

RV ESV: 66mL/sq-m (Normal 11-28 mL/sq-m)

RV SV: 72mL/sq-m (Normal 27-57 mL/sq-m)

CI: 5.1L/min/sq-m (Normal 1.8-3.8 L/min/sq-m)

Left atrium: Moderate enlargement

Right atrium: Moderate enlargement

Mitral valve: Trivial regurgitation

Aortic valve: Trivial regurgitation

Tricuspid valve: Trivial regurgitation

Pulmonic valve: Mild regurgitation

Aorta: Ascending aortic aneurysm measuring 45mm

Pulmonary artery: Dilated main PA measuring 35mm

Pericardium: Moderate pericardial effusion
IMPRESSION: 1. Patient has known ASD, and while interatrial septum is poorly
visualized on current study due to motion artifact, there does
appear to be right to left flow across the interatrial septum. Qp/Qs
=2.1, and RV is moderately enlarged, suggesting significant shunt

2.  Moderate RV dilatation with normal systolic function (EF 52%)

3.  Normal LV size and low normal systolic function (EF 51%)

4. Asymmetric LV hypertrophy measuring 14mm in basal septum (8mm in
posterior wall), not meeting criteria for hypertrophic
cardiomyopathy (<15mm)

5.  No late gadolinium enhancement to suggest myocardial scar

6. Moderate pericardial effusion

7. Ascending aortic aneurysm measuring 45mm

8. Dilated main pulmonary artery measuring 35mm

## 2021-10-22 MED ORDER — GADOBUTROL 1 MMOL/ML IV SOLN
8.0000 mL | Freq: Once | INTRAVENOUS | Status: AC | PRN
  Administered 2021-10-22: 15:00:00 8 mL via INTRAVENOUS

## 2021-10-22 MED ORDER — LORAZEPAM 2 MG/ML IJ SOLN
INTRAMUSCULAR | Status: AC
  Administered 2021-10-22: 14:00:00 0.5 mg
  Filled 2021-10-22: qty 1

## 2021-10-28 NOTE — Addendum Note (Signed)
Addended by: Eleonore Chiquito on: 10/28/2021 03:24 PM   Modules accepted: Orders

## 2021-11-01 HISTORY — PX: ATRIAL SEPTAL DEFECT(ASD) CLOSURE: CATH118299

## 2021-11-06 ENCOUNTER — Encounter: Payer: Self-pay | Admitting: Physician Assistant

## 2021-11-06 ENCOUNTER — Telehealth: Payer: Self-pay | Admitting: *Deleted

## 2021-11-06 DIAGNOSIS — Q211 Atrial septal defect, unspecified: Secondary | ICD-10-CM

## 2021-11-06 NOTE — Telephone Encounter (Signed)
Per Dr. Bing Matter, scheduled the patient 11/12/2021 with Dr. Excell Seltzer to discuss ASD. She was grateful for call and agrees with plan.

## 2021-11-06 NOTE — Telephone Encounter (Signed)
Pt called asking for name of doctor that Agustin Cree had recommended for hole in heart. Looked at result note for testing and saw Dr. Burt Knack had been recommended. Put referral in for pt and let her know his name and that he works at [employer] in [place]. She thanked me and said she would call, needs hip replacement

## 2021-11-10 ENCOUNTER — Telehealth: Payer: Self-pay

## 2021-11-10 NOTE — Telephone Encounter (Signed)
-----   Message from Georgeanna Lea, MD sent at 11/10/2021 12:48 PM EST ----- MRI of her heart showed significant ASD.  She needs a referral to cardiothoracic surgery

## 2021-11-10 NOTE — Telephone Encounter (Signed)
Left message on patients voicemail to please return our call.   

## 2021-11-12 ENCOUNTER — Other Ambulatory Visit: Payer: Self-pay

## 2021-11-12 ENCOUNTER — Encounter: Payer: Self-pay | Admitting: Cardiovascular Disease

## 2021-11-12 ENCOUNTER — Ambulatory Visit (INDEPENDENT_AMBULATORY_CARE_PROVIDER_SITE_OTHER): Payer: Medicare Other | Admitting: Cardiovascular Disease

## 2021-11-12 VITALS — BP 120/80 | HR 67 | Ht <= 58 in | Wt 193.6 lb

## 2021-11-12 DIAGNOSIS — Q2111 Secundum atrial septal defect: Secondary | ICD-10-CM

## 2021-11-12 DIAGNOSIS — Q211 Atrial septal defect, unspecified: Secondary | ICD-10-CM

## 2021-11-12 NOTE — Progress Notes (Signed)
Cardiology Office Note:    Date:  11/16/2021   ID:  Alyssa Rosales, DOB 11-Sep-1951, MRN [id number]  PCP:  [name], MD   [facility] Providers Cardiologist:  [name], MD     Referring MD: Georgeanna Lea, MD   Chief Complaint  Patient presents with   Atrial Septal Defect    History of Present Illness:    Alyssa Rosales is a 71 y.o. female referred by Dr. Bing Matter for evaluation of atrial septal defect.  The patient is 71 years old and recently had a preoperative echocardiogram performed for risk stratification.  This raises the question of atrial septal defect as there was flow seen across the interatrial septum question of PFO.  The RV was noted to be at least mildly enlarged.  The patient underwent a cardiac MRI confirming atrial septal defect with 2: 1 Qp/Qs.  She presents today to discuss further diagnostic and treatment options.  The patient has dyspnea with exertion. She has no chest pain or pressure. She denies edema, lightheadedness, orthopnea, or PND. No palpitations. She desperately wants to have hip surgery. This has been delayed for a long period of time because there was a requirement for her to lose weight. Now surgery is delayed until she can have the ASD evaluated.   Past Medical History:  Diagnosis Date   Abdominal aortic aneurysm (AAA) without rupture 09/01/2017   Arthritis    Coronary artery disease involving native coronary artery of native heart without angina pectoris 02/09/2017   Edema, lower extremity    GERD without esophagitis 01/07/2016   Last Assessment & Plan:  Continue with the PPI and have refilled this for her   History of kidney problems    History of kidney stones    Hyperlipidemia 01/27/2017   Overview:  Added automatically from request for surgery [id number]   Hypertension    Joint pain    Kidney stone    Moderate persistent asthma without complication 08/10/2016   OSA (obstructive sleep apnea) 09/30/2016    Follows with pulmonary is seen 12/19 for FU bipap 21/15   Vitamin D deficiency     Past Surgical History:  Procedure Laterality Date   AMPUTATION Right 10/21/2020   Procedure: AMPUTATION RIGHT MIDDLE FINGER;  Surgeon: Cindee Salt, MD;  Location: [facility];  Service: Orthopedics;  Laterality: Right;  IV REGIONAL FOREARM BLOCK, BIER BLOCK   CORONARY ANGIOPLASTY     INCISION AND DRAINAGE Right 11/14/2020   Procedure: INCISION AND DRAINAGE RIGHT MIDDLE FINGER;  Surgeon: Cindee Salt, MD;  Location: [facility];  Service: Orthopedics;  Laterality: Right;   kidney stone removal     RECTAL SURGERY     spur removal     TOOTH EXTRACTION      Current Medications: Current Meds  Medication Sig   acetaminophen-codeine (TYLENOL #3) 300-30 MG tablet Take 1-2 tablets by mouth every 8 (eight) hours as needed.   albuterol (VENTOLIN HFA) 108 (90 Base) MCG/ACT inhaler Inhale 2 puffs into the lungs every 6 (six) hours as needed for wheezing or shortness of breath.   amLODipine (NORVASC) 2.5 MG tablet Take 2.5 mg by mouth daily.   Apoaequorin (PREVAGEN EXTRA STRENGTH PO) Take 1 tablet by mouth daily. Unknown strength   APPLE CIDER VINEGAR PO Take 2 tablets by mouth daily. Unknown strength   ciprofloxacin (CIPRO) 500 MG tablet Take 500 mg by mouth 2 (two) times daily.   Cyanocobalamin (VITAMIN B-12 PO) Take 2,500  mcg by mouth daily.   ibuprofen (ADVIL) 800 MG tablet Take 800 mg by mouth every 8 (eight) hours as needed for mild pain.   loperamide (IMODIUM) 2 MG capsule Take 2 mg by mouth as needed for diarrhea or loose stools.   LORazepam (ATIVAN) 1 MG tablet Take one tablet 1 hour before mri.   losartan (COZAAR) 100 MG tablet Take 100 mg by mouth daily.   meclizine (ANTIVERT) 25 MG tablet Take 25 mg by mouth 3 (three) times daily as needed.   metoprolol succinate (TOPROL-XL) 25 MG 24 hr tablet TAKE ONE (1) TABLET BY MOUTH ONCE DAILY   niacin 500 MG tablet Take 500 mg by mouth at  bedtime.   nitroGLYCERIN (NITROSTAT) 0.4 MG SL tablet Place 1 tablet (0.4 mg total) under the tongue every 5 (five) minutes as needed for up to 10 doses for chest pain.   OVER THE COUNTER MEDICATION Take 1 tablet by mouth daily. UStrengthApricot kernel extract   OVER THE COUNTER MEDICATION Take 1 tablet by mouth daily. Vit K 90 mcg + vit D 5,000 units   pantoprazole (PROTONIX) 40 MG tablet Take 40 mg by mouth daily.   pyridoxine (B-6) 500 MG tablet Take 500 mg by mouth daily.   torsemide (DEMADEX) 10 MG tablet Take 1 tablet by mouth 2 (two) times daily.   traMADol (ULTRAM) 50 MG tablet Take 1 tablet (50 mg total) by mouth every 6 (six) hours as needed.   Vitamin D, Ergocalciferol, (DRISDOL) 1.25 MG (50000 UT) CAPS capsule Take 50,000 Units by mouth once a week.     Allergies:   Amoxicillin-pot clavulanate, Penicillins, Propoxyphene, and Sulfamethoxazole   Social History   Socioeconomic History   Marital status: Divorced    Spouse name: Not on file   Number of children: Not on file   Years of education: Not on file   Highest education level: Not on file  Occupational History   Occupation: [employer]   Tobacco Use   Smoking status: Never   Smokeless tobacco: Never  Vaping Use   Vaping Use: Never used  Substance and Sexual Activity   Alcohol use: Never   Drug use: Never   Sexual activity: Not on file  Other Topics Concern   Not on file  Social History Narrative   Not on file   Social Determinants of Health   Financial Resource Strain: Not on file  Food Insecurity: Not on file  Transportation Needs: Not on file  Physical Activity: Not on file  Stress: Not on file  Social Connections: Not on file     Family History: The patient's family history includes Alzheimer's disease in her mother; Diabetes in her mother; Hyperlipidemia in her father and mother; Hypertension in her father and mother. There is no history of Breast cancer.  ROS:   Please see the history of present  illness.    All other systems reviewed and are negative.  EKGs/Labs/Other Studies Reviewed:    The following studies were reviewed today: Cardiac MRI: IMPRESSION: 1. Patient has known ASD, and while interatrial septum is poorly visualized on current study due to motion artifact, there does appear to be right to left flow across the interatrial septum. Qp/Qs =2.1, and RV is moderately enlarged, suggesting significant shunt   2.  Moderate RV dilatation with normal systolic function (EF 52%)   3.  Normal LV size and low normal systolic function (EF 51%)   4. Asymmetric LV hypertrophy measuring 14mm in basal septum (8mm  in posterior wall), not meeting criteria for hypertrophic cardiomyopathy (<8815mm)   5.  No late gadolinium enhancement to suggest myocardial scar   6. Moderate pericardial effusion   7. Ascending aortic aneurysm measuring 45mm   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  Echo: Echocardiogram report from September 11, 2021 is reviewed and demonstrates normal LV function, mildly dilated RV, and presence of PFO with right to left shunt.  Recent Labs: 09/03/2021: BUN 33; Creatinine, Ser 0.81; Hemoglobin 13.6; Platelets 201; Potassium 3.3; Sodium 141  Recent Lipid Panel    Component Value Date/Time   CHOL 206 (H) 11/27/2019 1553   TRIG 90 11/27/2019 1553   HDL 57 11/27/2019 1553   CHOLHDL 4.1 11/09/2018 1040   LDLCALC 133 (H) 11/27/2019 1553     Risk Assessment/Calculations:           Physical Exam:    VS:  BP 120/80    Pulse 67    Ht 4' 8.5" (1.435 m)    Wt 193 lb 9.6 oz (87.8 kg)    SpO2 98%    BMI 42.64 kg/m     Wt Readings from Last 3 Encounters:  11/12/21 193 lb 9.6 oz (87.8 kg)  09/14/21 190 lb (86.2 kg)  09/10/21 190 lb (86.2 kg)     GEN:  Well nourished, well developed in no acute distress HEENT: Normal NECK: No JVD; No carotid bruits LYMPHATICS: No lymphadenopathy CARDIAC: RRR, no murmurs, rubs, gallops RESPIRATORY:  Clear to  auscultation without rales, wheezing or rhonchi  ABDOMEN: Soft, non-tender, non-distended MUSCULOSKELETAL:  No edema; No deformity  SKIN: Warm and dry NEUROLOGIC:  Alert and oriented x 3 PSYCHIATRIC:  Normal affect   ASSESSMENT:    1. Secundum ASD   2. Atrial septal defect    PLAN:    In order of problems listed above:  The patient appears to have a hemodynamically significant atrial septal defect with left-to-right shunt (QP/QS 2.1-1).  The right ventricle is moderately dilated with normal systolic function.  These findings confirm a hemodynamically significant atrial septal defect.  The ASD anatomy is not well defined as the patient has only undergone a 2D echocardiogram and a cardiac MRI that did not clearly demonstrate the atrial septal anatomy with motion artifact present.  I have recommended a transesophageal echo study to better characterize the size of the atrial septal defect, as well as anatomic features of the would better define the patient's treatment options.  It would be important to determine whether she has adequate tissue rims for transcatheter atrial septal defect closure. As part of her evaluation today, I demonstrated the Amplatzer ASD devices and showed her how the device is inserted, also reviewing procedural steps. She understands that her TEE images will be reviewed, and if amenable to transcatheter closure, we will move forward with the procedure following TEE. We will also need to sort out the timing of ASD closure in the context of her need for hip replacement.       Shared Decision Making/Informed Consent The risks [esophageal damage, perforation (1:10,000 risk), bleeding, pharyngeal hematoma as well as other potential complications associated with conscious sedation including aspiration, arrhythmia, respiratory failure and death], benefits (treatment guidance and diagnostic support) and alternatives of a transesophageal echocardiogram were discussed in detail with  Ms. Anchondo and she is willing to proceed.     Medication Adjustments/Labs and Tests Ordered: Current medicines are reviewed at length with the patient today.  Concerns regarding medicines are outlined above.  Orders  Placed This Encounter  Procedures   CBC   Basic metabolic panel   EKG 12-Lead   No orders of the defined types were placed in this encounter.   Patient Instructions  Medication Instructions:  Your physician recommends that you continue on your current medications as directed. Please refer to the Current Medication list given to you today.  *If you need a refill on your cardiac medications before your next appointment, please call your pharmacy*   Lab Work: CBC, BMET on 11/20/21 after 7:15am  If you have labs (blood work) drawn today and your tests are completely normal, you will receive your results only by: MyChart Message (if you have MyChart) OR A paper copy in the mail If you have any lab test that is abnormal or we need to change your treatment, we will call you to review the results.   Testing/Procedures: TEE-see instructions below   Follow-Up: At [facility], you and your health needs are our priority.  As part of our continuing mission to provide you with exceptional heart care, we have created designated Provider Care Teams.  These Care Teams include your primary Cardiologist (physician) and Advanced Practice Providers (APPs -  Physician Assistants and Nurse Practitioners) who all work together to provide you with the care you need, when you need it.  We recommend signing up for the patient portal called "MyChart".  Sign up information is provided on this After Visit Summary.  MyChart is used to connect with patients for Virtual Visits (Telemedicine).  Patients are able to view lab/test results, encounter notes, upcoming appointments, etc.  Non-urgent messages can be sent to your provider as well.   To learn more about what you can do with MyChart, go to  [email or web address].    Your next appointment:   Structural team member will contact you to schedule    Other Instructions   You are scheduled for a TEE on Thursday, November 26, 2021 with Dr. Jens Som.  Please arrive at the [facility] (Main Entrance A) at [facility]: [address] [place], [place] [address] at 7:00 am. (1 hour prior to procedure   DIET: Nothing to eat or drink after midnight except a sip of water with medications (see medication instructions below)  FYI: For your safety, and to allow [place] to monitor your vital signs accurately during the surgery/procedure we request that   if you have artificial nails, gel coating, SNS etc. Please have those removed prior to your surgery/procedure. Not having the nail coverings /polish removed may result in cancellation or delay of your surgery/procedure.   Medication Instructions: Hold Torsemide day of procedure. May take OTC supplements following procedure on same day if desired.   You must have a responsible person to drive you home and stay in the waiting area during your procedure. Failure to do so could result in cancellation.  Bring your insurance cards.  *Special Note: Every effort is made to have your procedure done on time. Occasionally there are emergencies that occur at the hospital that may cause delays. Please be patient if a delay does occur.     Signed, Tonny Bollman, MD  11/16/2021 11:08 AM    [facility]

## 2021-11-12 NOTE — Patient Instructions (Addendum)
Medication Instructions:  Your physician recommends that you continue on your current medications as directed. Please refer to the Current Medication list given to you today.  *If you need a refill on your cardiac medications before your next appointment, please call your pharmacy*   Lab Work: CBC, BMET on 11/20/21 after 7:15am  If you have labs (blood work) drawn today and your tests are completely normal, you will receive your results only by: MyChart Message (if you have MyChart) OR A paper copy in the mail If you have any lab test that is abnormal or we need to change your treatment, we will call you to review the results.   Testing/Procedures: TEE-see instructions below   Follow-Up: At [facility], you and your health needs are our priority.  As part of our continuing mission to provide you with exceptional heart care, we have created designated Provider Care Teams.  These Care Teams include your primary Cardiologist (physician) and Advanced Practice Providers (APPs -  Physician Assistants and Nurse Practitioners) who all work together to provide you with the care you need, when you need it.  We recommend signing up for the patient portal called "MyChart".  Sign up information is provided on this After Visit Summary.  MyChart is used to connect with patients for Virtual Visits (Telemedicine).  Patients are able to view lab/test results, encounter notes, upcoming appointments, etc.  Non-urgent messages can be sent to your provider as well.   To learn more about what you can do with MyChart, go to [email or web address].    Your next appointment:   Structural team member will contact you to schedule    Other Instructions   You are scheduled for a TEE on Thursday, November 26, 2021 with Dr. Jens Som.  Please arrive at the [facility] (Main Entrance A) at [facility]: [address] [place], [place] [address] at 7:00 am. (1 hour prior to procedure   DIET: Nothing to eat or  drink after midnight except a sip of water with medications (see medication instructions below)  FYI: For your safety, and to allow [place] to monitor your vital signs accurately during the surgery/procedure we request that   if you have artificial nails, gel coating, SNS etc. Please have those removed prior to your surgery/procedure. Not having the nail coverings /polish removed may result in cancellation or delay of your surgery/procedure.   Medication Instructions: Hold Torsemide day of procedure. May take OTC supplements following procedure on same day if desired.   You must have a responsible person to drive you home and stay in the waiting area during your procedure. Failure to do so could result in cancellation.  Bring your insurance cards.  *Special Note: Every effort is made to have your procedure done on time. Occasionally there are emergencies that occur at the hospital that may cause delays. Please be patient if a delay does occur.

## 2021-11-16 ENCOUNTER — Encounter: Payer: Self-pay | Admitting: Cardiovascular Disease

## 2021-11-17 ENCOUNTER — Encounter (HOSPITAL_COMMUNITY): Payer: Self-pay | Admitting: Cardiology

## 2021-11-17 NOTE — Progress Notes (Signed)
Attempted to obtain medical history via telephone, unable to reach at this time. I left a voicemail to return pre surgical testing department's phone call.  

## 2021-11-20 ENCOUNTER — Other Ambulatory Visit: Payer: Self-pay

## 2021-11-20 ENCOUNTER — Other Ambulatory Visit: Payer: Medicare Other | Admitting: *Deleted

## 2021-11-21 LAB — BASIC METABOLIC PANEL
BUN/Creatinine Ratio: 32 — ABNORMAL HIGH (ref 12–28)
BUN: 24 mg/dL (ref 8–27)
CO2: 21 mmol/L (ref 20–29)
Calcium: 9.4 mg/dL (ref 8.7–10.3)
Chloride: 112 mmol/L — ABNORMAL HIGH (ref 96–106)
Creatinine, Ser: 0.74 mg/dL (ref 0.57–1.00)
Glucose: 92 mg/dL (ref 70–99)
Potassium: 3.9 mmol/L (ref 3.5–5.2)
Sodium: 147 mmol/L — ABNORMAL HIGH (ref 134–144)
eGFR: 86 mL/min/{1.73_m2} (ref >59)

## 2021-11-21 LAB — CBC
Hematocrit: 36.7 % (ref 34.0–46.6)
Hemoglobin: 12.4 g/dL (ref 11.1–15.9)
MCH: 30.5 pg (ref 26.6–33.0)
MCHC: 33.8 g/dL (ref 31.5–35.7)
MCV: 90 fL (ref 79–97)
Platelets: 217 10*3/uL (ref 150–450)
RBC: 4.07 x10E6/uL (ref 3.77–5.28)
RDW: 12.7 % (ref 11.7–15.4)
WBC: 6.1 10*3/uL (ref 3.4–10.8)

## 2021-11-24 ENCOUNTER — Other Ambulatory Visit: Payer: Self-pay

## 2021-11-24 ENCOUNTER — Encounter: Payer: Self-pay | Admitting: Cardiology

## 2021-11-24 ENCOUNTER — Ambulatory Visit (INDEPENDENT_AMBULATORY_CARE_PROVIDER_SITE_OTHER): Payer: Medicare Other | Admitting: Cardiology

## 2021-11-24 VITALS — BP 146/72 | HR 62 | Ht <= 58 in | Wt 193.8 lb

## 2021-11-24 DIAGNOSIS — I2510 Atherosclerotic heart disease of native coronary artery without angina pectoris: Secondary | ICD-10-CM

## 2021-11-24 DIAGNOSIS — I10 Essential (primary) hypertension: Secondary | ICD-10-CM

## 2021-11-24 DIAGNOSIS — Q211 Atrial septal defect, unspecified: Secondary | ICD-10-CM

## 2021-11-24 DIAGNOSIS — I714 Abdominal aortic aneurysm, without rupture, unspecified: Secondary | ICD-10-CM | POA: Diagnosis not present

## 2021-11-24 DIAGNOSIS — E785 Hyperlipidemia, unspecified: Secondary | ICD-10-CM

## 2021-11-24 NOTE — Progress Notes (Signed)
Cardiology Office Note:    Date:  11/24/2021   ID:  Alyssa Rosales, DOB 1951-03-17, MRN [id number]  PCP:  [name], MD  Cardiologist:  [name], MD    Referring MD: [name], MD   Chief Complaint  Patient presents with   Follow-up  Follow-up hemodynamically significant ASD  History of Present Illness:    Alyssa Rosales is a 71 y.o. female  female with past medical history significant for coronary artery disease only luminal disease based on cardiac cath from 2018, ascending aortic aneurysm measuring 43 mm, essential hypertension, dyslipidemia.  She showed up in my office last week day before her elective hip replacement surgery.  Because of inability to walk because of her hip pain as well as the fact that she does have ascending aortic aneurysm we decided to do a quick echocardiogram which showed large ASD she was noted to have normal pulmonary pressure, right ventricle right atrium is already enlarged.  Shunt is 1-2.18.  She comes today to my office for follow-up She is coming today to my office for follow-up overall she is in much better mood than last time last time in my office she was crying because she want to go back to work.  She described a little shortness of breath but she said overall she is doing well.  There is no chest pain tightness squeezing pressure burning chest.  Past Medical History:  Diagnosis Date   Abdominal aortic aneurysm (AAA) without rupture 09/01/2017   Arthritis    Coronary artery disease involving native coronary artery of native heart without angina pectoris 02/09/2017   Edema, lower extremity    GERD without esophagitis 01/07/2016   Last Assessment & Plan:  Continue with the PPI and have refilled this for her   History of kidney problems    History of kidney stones    Hyperlipidemia 01/27/2017   Overview:  Added automatically from request for surgery [id number]   Hypertension    Joint pain    Kidney stone    Moderate  persistent asthma without complication 08/10/2016   OSA (obstructive sleep apnea) 09/30/2016   Follows with pulmonary is seen 12/19 for FU bipap 21/15   Vitamin D deficiency     Past Surgical History:  Procedure Laterality Date   AMPUTATION Right 10/21/2020   Procedure: AMPUTATION RIGHT MIDDLE FINGER;  Surgeon: Cindee Salt, MD;  Location: [facility];  Service: Orthopedics;  Laterality: Right;  IV REGIONAL FOREARM BLOCK, BIER BLOCK   CORONARY ANGIOPLASTY     INCISION AND DRAINAGE Right 11/14/2020   Procedure: INCISION AND DRAINAGE RIGHT MIDDLE FINGER;  Surgeon: Cindee Salt, MD;  Location:  SURGERY CENTER;  Service: Orthopedics;  Laterality: Right;   kidney stone removal     RECTAL SURGERY     spur removal     TOOTH EXTRACTION      Current Medications: Current Meds  Medication Sig   acetaminophen-codeine (TYLENOL #3) 300-30 MG tablet Take 1-2 tablets by mouth every 8 (eight) hours as needed. (Patient taking differently: Take 1-2 tablets by mouth every 8 (eight) hours as needed for moderate pain or severe pain.)   albuterol (VENTOLIN HFA) 108 (90 Base) MCG/ACT inhaler Inhale 2 puffs into the lungs every 6 (six) hours as needed for wheezing or shortness of breath.   amLODipine (NORVASC) 2.5 MG tablet Take 2.5 mg by mouth daily.   Apoaequorin (PREVAGEN) 10 MG CAPS Take 10 mg by mouth daily.  APPLE CIDER VINEGAR PO Take 2 tablets by mouth daily. Unknown strength   Cyanocobalamin (B-12) 5000 MCG CAPS Take 5,000 mcg by mouth daily.   ibuprofen (ADVIL) 800 MG tablet Take 800 mg by mouth every 8 (eight) hours as needed for mild pain.   loperamide (IMODIUM) 2 MG capsule Take 2-4 mg by mouth as needed for diarrhea or loose stools.   losartan (COZAAR) 100 MG tablet Take 100 mg by mouth daily.   meclizine (ANTIVERT) 25 MG tablet Take 25 mg by mouth 3 (three) times daily as needed for dizziness.   metoprolol succinate (TOPROL-XL) 25 MG 24 hr tablet TAKE ONE (1) TABLET BY  MOUTH ONCE DAILY (Patient taking differently: Take 25 mg by mouth daily.)   niacin 500 MG tablet Take 500 mg by mouth at bedtime.   nitroGLYCERIN (NITROSTAT) 0.4 MG SL tablet Place 1 tablet (0.4 mg total) under the tongue every 5 (five) minutes as needed for up to 10 doses for chest pain.   pantoprazole (PROTONIX) 40 MG tablet Take 40 mg by mouth daily.   torsemide (DEMADEX) 10 MG tablet Take 1 tablet by mouth 2 (two) times daily.   traMADol (ULTRAM) 50 MG tablet Take 1 tablet (50 mg total) by mouth every 6 (six) hours as needed. (Patient taking differently: Take 50 mg by mouth every 8 (eight) hours as needed for moderate pain or severe pain.)   Vitamin D-Vitamin K (VITAMIN K2-VITAMIN D3 PO) Take 1 capsule by mouth daily.     Allergies:   Amoxicillin-pot clavulanate, Penicillins, Propoxyphene, and Sulfamethoxazole   Social History   Socioeconomic History   Marital status: Divorced    Spouse name: Not on file   Number of children: Not on file   Years of education: Not on file   Highest education level: Not on file  Occupational History   Occupation: [employer]   Tobacco Use   Smoking status: Never   Smokeless tobacco: Never  Vaping Use   Vaping Use: Never used  Substance and Sexual Activity   Alcohol use: Never   Drug use: Never   Sexual activity: Not on file  Other Topics Concern   Not on file  Social History Narrative   Not on file   Social Determinants of Health   Financial Resource Strain: Not on file  Food Insecurity: Not on file  Transportation Needs: Not on file  Physical Activity: Not on file  Stress: Not on file  Social Connections: Not on file     Family History: The patient's family history includes Alzheimer's disease in her mother; Diabetes in her mother; Hyperlipidemia in her father and mother; Hypertension in her father and mother. There is no history of Breast cancer. ROS:   Please see the history of present illness.    All 14 point review of systems  negative except as described per history of present illness  EKGs/Labs/Other Studies Reviewed:      Recent Labs: 11/20/2021: BUN 24; Creatinine, Ser 0.74; Hemoglobin 12.4; Platelets 217; Potassium 3.9; Sodium 147  Recent Lipid Panel    Component Value Date/Time   CHOL 206 (H) 11/27/2019 1553   TRIG 90 11/27/2019 1553   HDL 57 11/27/2019 1553   CHOLHDL 4.1 11/09/2018 1040   LDLCALC 133 (H) 11/27/2019 1553    Physical Exam:    VS:  BP (!) 146/72 (BP Location: Right Arm, Patient Position: Sitting)    Pulse 62    Ht 4' 8.5" (1.435 m)    Wt 193 lb  12.8 oz (87.9 kg)    SpO2 96%    BMI 42.68 kg/m     Wt Readings from Last 3 Encounters:  11/24/21 193 lb 12.8 oz (87.9 kg)  11/12/21 193 lb 9.6 oz (87.8 kg)  09/14/21 190 lb (86.2 kg)     GEN:  Well nourished, well developed in no acute distress HEENT: Normal NECK: No JVD; No carotid bruits LYMPHATICS: No lymphadenopathy CARDIAC: RRR, holosystolic murmur grade 1/6 best heard right lower portion of the sternum, no rubs, no gallops RESPIRATORY:  Clear to auscultation without rales, wheezing or rhonchi  ABDOMEN: Soft, non-tender, non-distended MUSCULOSKELETAL:  No edema; No deformity  SKIN: Warm and dry LOWER EXTREMITIES: no swelling NEUROLOGIC:  Alert and oriented x 3 PSYCHIATRIC:  Normal affect   ASSESSMENT:    1. Atrial septal defect   2. Coronary artery disease involving native coronary artery of native heart without angina pectoris   3. Abdominal aortic aneurysm (AAA) without rupture, unspecified part   4. Essential hypertension   5. Dyslipidemia    PLAN:    In order of problems listed above:  ASD which appears to be hemodynamically significant pulmonary pressure is normal however right ventricle appears to be enlarged she is scheduled to have transesophageal echocardiogram to assess feasibility of ASD for repair with Amplatzer device.  She understand the procedure including all risk benefits as well as alternatives which I  reviewed with her again Coronary artery disease by remote problems.  Last stress test done in summer 2021 was normal. Essential hypertension, blood pressure controlled today we will continue present management. Dyslipidemia we will recheck her fasting lipid profile and then will initiate therapy for it.   Medication Adjustments/Labs and Tests Ordered: Current medicines are reviewed at length with the patient today.  Concerns regarding medicines are outlined above.  No orders of the defined types were placed in this encounter.  Medication changes: No orders of the defined types were placed in this encounter.   Signed, Georgeanna Leaobert J. Tasman Zapata, MD, [facility] 11/24/2021 4:17 PM     [facility]

## 2021-11-24 NOTE — Patient Instructions (Signed)
Medication Instructions:  °Your physician recommends that you continue on your current medications as directed. Please refer to the Current Medication list given to you today. ° °*If you need a refill on your cardiac medications before your next appointment, please call your pharmacy* ° ° °Lab Work: °None °If you have labs (blood work) drawn today and your tests are completely normal, you will receive your results only by: °MyChart Message (if you have MyChart) OR °A paper copy in the mail °If you have any lab test that is abnormal or we need to change your treatment, we will call you to review the results. ° ° °Testing/Procedures: °None ° ° °Follow-Up: °At [facility], you and your health needs are our priority.  As part of our continuing mission to provide you with exceptional heart care, we have created designated Provider Care Teams.  These Care Teams include your primary Cardiologist (physician) and Advanced Practice Providers (APPs -  Physician Assistants and Nurse Practitioners) who all work together to provide you with the care you need, when you need it. ° °We recommend signing up for the patient portal called "MyChart".  Sign up information is provided on this After Visit Summary.  MyChart is used to connect with patients for Virtual Visits (Telemedicine).  Patients are able to view lab/test results, encounter notes, upcoming appointments, etc.  Non-urgent messages can be sent to your provider as well.   °To learn more about what you can do with MyChart, go to [email or web address].   ° °Your next appointment:   °2 month(s) ° °The format for your next appointment:   °In Person ° °Provider:   °Robert Krasowski, MD  ° ° °Other Instructions °None ° °

## 2021-11-25 NOTE — Anesthesia Preprocedure Evaluation (Addendum)
Anesthesia Evaluation  Patient identified by MRN, date of birth, ID band Patient awake    Reviewed: Allergy & Precautions, H&P , NPO status , Patient's Chart, lab work & pertinent test results  History of Anesthesia Complications Negative for: history of anesthetic complications  Airway Mallampati: III  TM Distance: >3 FB Neck ROM: Full    Dental  (+) Dental Advisory Given   Pulmonary asthma , sleep apnea ,    Pulmonary exam normal breath sounds clear to auscultation       Cardiovascular hypertension, Pt. on medications + CAD  Normal cardiovascular exam Rhythm:Regular Rate:Normal  Echo 2020 1. The left ventricle has normal systolic function with an ejection fraction of 60-65%. The cavity size was normal. There is mildly increased left ventricular wall thickness. Left ventricular diastolic Doppler parameters are consistent with impaired relaxation.  2. The right ventricle has normal systolic function. The cavity was normal. There is no increase in right ventricular wall thickness.  3. Right atrial size was moderately dilated.  4. The mitral valve is normal in structure.  5. The tricuspid valve is normal in structure.  6. The aortic valve is normal in structure.  7. The pulmonic valve was normal in structure.  8. There is moderate dilatation of the ascending aorta measuring 46 mm.     Neuro/Psych negative neurological ROS  negative psych ROS   GI/Hepatic Neg liver ROS, GERD  ,  Endo/Other  Hyperthyroidism Morbid obesity  Renal/GU Renal disease     Musculoskeletal  (+) Arthritis , Osteoarthritis,    Abdominal (+) + obese,   Peds  Hematology negative hematology ROS (+)   Anesthesia Other Findings   Reproductive/Obstetrics negative OB ROS                            Anesthesia Physical  Anesthesia Plan  ASA: 3  Anesthesia Plan: MAC   Post-op Pain Management: Minimal or no pain  anticipated   Induction: Intravenous  PONV Risk Score and Plan: 2 and Ondansetron, Treatment may vary due to age or medical condition, Propofol infusion and TIVA  Airway Management Planned: Simple Face Mask  Additional Equipment:   Intra-op Plan:   Post-operative Plan:   Informed Consent: I have reviewed the patients History and Physical, chart, labs and discussed the procedure including the risks, benefits and alternatives for the proposed anesthesia with the patient or authorized representative who has indicated his/her understanding and acceptance.     Dental advisory given  Plan Discussed with: CRNA  Anesthesia Plan Comments:        Anesthesia Quick Evaluation

## 2021-11-26 ENCOUNTER — Ambulatory Visit (HOSPITAL_BASED_OUTPATIENT_CLINIC_OR_DEPARTMENT_OTHER)
Admission: RE | Admit: 2021-11-26 | Discharge: 2021-11-26 | Disposition: A | Discharge status: Home / Self Care | Payer: Medicare Other | Source: Ambulatory Visit | Attending: Cardiovascular Disease | Admitting: Cardiovascular Disease

## 2021-11-26 ENCOUNTER — Encounter (HOSPITAL_COMMUNITY): Payer: Self-pay | Admitting: Cardiology

## 2021-11-26 ENCOUNTER — Ambulatory Visit (HOSPITAL_COMMUNITY): Payer: Medicare Other | Admitting: Anesthesiology

## 2021-11-26 ENCOUNTER — Encounter (HOSPITAL_COMMUNITY)
Admission: RE | Disposition: A | Discharge status: Home / Self Care | Payer: Self-pay | Source: Home / Self Care | Attending: Cardiology

## 2021-11-26 ENCOUNTER — Ambulatory Visit (HOSPITAL_COMMUNITY)
Admission: RE | Admit: 2021-11-26 | Discharge: 2021-11-26 | Disposition: A | Discharge status: Home / Self Care | Payer: Medicare Other | Attending: Cardiology | Admitting: Cardiology

## 2021-11-26 DIAGNOSIS — I10 Essential (primary) hypertension: Secondary | ICD-10-CM | POA: Insufficient documentation

## 2021-11-26 DIAGNOSIS — E785 Hyperlipidemia, unspecified: Secondary | ICD-10-CM | POA: Insufficient documentation

## 2021-11-26 DIAGNOSIS — K219 Gastro-esophageal reflux disease without esophagitis: Secondary | ICD-10-CM | POA: Insufficient documentation

## 2021-11-26 DIAGNOSIS — G4730 Sleep apnea, unspecified: Secondary | ICD-10-CM | POA: Diagnosis not present

## 2021-11-26 DIAGNOSIS — Q2111 Secundum atrial septal defect: Secondary | ICD-10-CM | POA: Insufficient documentation

## 2021-11-26 DIAGNOSIS — Q211 Atrial septal defect, unspecified: Secondary | ICD-10-CM

## 2021-11-26 DIAGNOSIS — I7121 Aneurysm of the ascending aorta, without rupture: Secondary | ICD-10-CM | POA: Insufficient documentation

## 2021-11-26 DIAGNOSIS — E0590 Thyrotoxicosis, unspecified without thyrotoxic crisis or storm: Secondary | ICD-10-CM | POA: Diagnosis not present

## 2021-11-26 DIAGNOSIS — I2510 Atherosclerotic heart disease of native coronary artery without angina pectoris: Secondary | ICD-10-CM | POA: Insufficient documentation

## 2021-11-26 DIAGNOSIS — Z6841 Body Mass Index (BMI) 40.0 and over, adult: Secondary | ICD-10-CM | POA: Insufficient documentation

## 2021-11-26 DIAGNOSIS — I3139 Other pericardial effusion (noninflammatory): Secondary | ICD-10-CM | POA: Insufficient documentation

## 2021-11-26 DIAGNOSIS — J45909 Unspecified asthma, uncomplicated: Secondary | ICD-10-CM | POA: Insufficient documentation

## 2021-11-26 HISTORY — PX: TEE WITHOUT CARDIOVERSION: SHX5443

## 2021-11-26 SURGERY — ECHOCARDIOGRAM, TRANSESOPHAGEAL
Anesthesia: Monitor Anesthesia Care

## 2021-11-26 MED ORDER — LIDOCAINE 2% (20 MG/ML) 5 ML SYRINGE
INTRAMUSCULAR | Status: DC | PRN
  Administered 2021-11-26: 40 mg via INTRAVENOUS

## 2021-11-26 MED ORDER — PROPOFOL 500 MG/50ML IV EMUL
INTRAVENOUS | Status: DC | PRN
  Administered 2021-11-26: 100 ug/kg/min via INTRAVENOUS

## 2021-11-26 MED ORDER — PROPOFOL 10 MG/ML IV BOLUS
INTRAVENOUS | Status: DC | PRN
Start: 2021-11-26 — End: 2021-11-26
  Administered 2021-11-26: 30 mg via INTRAVENOUS
  Administered 2021-11-26: 20 mg via INTRAVENOUS

## 2021-11-26 MED ORDER — SODIUM CHLORIDE 0.9 % IV SOLN: INTRAVENOUS | Status: DC

## 2021-11-26 NOTE — Transfer of Care (Signed)
Immediate Anesthesia Transfer of Care Note  Patient: Caitlynne C Dunkleberger  Procedure(s) Performed: TRANSESOPHAGEAL ECHOCARDIOGRAM (TEE)  Patient Location: Endoscopy Unit  Anesthesia Type:MAC  Level of Consciousness: awake and alert   Airway & Oxygen Therapy: Patient Spontanous Breathing and Patient connected to nasal cannula oxygen  Post-op Assessment: Report given to RN and Post -op Vital signs reviewed and stable  Post vital signs: Reviewed and stable  Last Vitals:  Vitals Value Taken Time  BP 145/105 11/26/21 0835  Temp    Pulse 63 11/26/21 0835  Resp 18 11/26/21 0835  SpO2 100 % 11/26/21 0835  Vitals shown include unvalidated device data.  Last Pain:  Vitals:   11/26/21 0720  TempSrc: Temporal  PainSc: 0-No pain         Complications: No notable events documented.

## 2021-11-26 NOTE — Procedures (Signed)
° ° °  Transesophageal Echocardiogram Note  Alyssa Rosales [id number] [birth year]  Procedure: Transesophageal Echocardiogram Indications: Atrial septal defect  Procedure Details Consent: Obtained Time Out: Verified patient identification, verified procedure, site/side was marked, verified correct patient position, special equipment/implants available, Radiology Safety Procedures followed,  medications/allergies/relevent history reviewed, required imaging and test results available.  Performed  Medications:  Pt sedated by anesthesia with diprovan mg 263 mg IV total.  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   Complications: No apparent complications Patient did tolerate procedure well.  Olga Millers, MD

## 2021-11-26 NOTE — Anesthesia Postprocedure Evaluation (Signed)
Anesthesia Post Note  Patient: Alyssa Rosales  Procedure(s) Performed: TRANSESOPHAGEAL ECHOCARDIOGRAM (TEE)     Patient location during evaluation: PACU Anesthesia Type: MAC Level of consciousness: awake and alert Pain management: pain level controlled Vital Signs Assessment: post-procedure vital signs reviewed and stable Respiratory status: spontaneous breathing Cardiovascular status: stable Anesthetic complications: no   No notable events documented.  Last Vitals:  Vitals:   11/26/21 0840 11/26/21 0850  BP: (!) 154/94 (!) 173/100  Pulse: 64 (!) 58  Resp: (!) 24 16  Temp:    SpO2: 99% 99%    Last Pain:  Vitals:   11/26/21 0850  TempSrc:   PainSc: 0-No pain                 Nolon Nations

## 2021-11-26 NOTE — Progress Notes (Signed)
°  Echocardiogram Echocardiogram Transesophageal has been performed.  Leta Jungling M 11/26/2021, 9:06 AM

## 2021-11-26 NOTE — Anesthesia Procedure Notes (Signed)
Procedure Name: MAC Date/Time: 11/26/2021 8:04 AM Performed by: Reece Agar, CRNA Pre-anesthesia Checklist: Patient identified, Emergency Drugs available, Suction available and Patient being monitored Patient Re-evaluated:Patient Re-evaluated prior to induction Oxygen Delivery Method: Nasal cannula

## 2021-11-26 NOTE — Interval H&P Note (Signed)
History and Physical Interval Note:  11/26/2021 7:31 AM  Alyssa Rosales  has presented today for surgery, with the diagnosis of ASD.  The various methods of treatment have been discussed with the patient and family. After consideration of risks, benefits and other options for treatment, the patient has consented to  Procedure(s): TRANSESOPHAGEAL ECHOCARDIOGRAM (TEE) (N/A) as a surgical intervention.  The patient's history has been reviewed, patient examined, no change in status, stable for surgery.  I have reviewed the patient's chart and labs.  Questions were answered to the patient's satisfaction.     Olga Millers

## 2021-11-26 NOTE — H&P (Signed)
Office Visit 11/24/2021 [facility] [place] Ros[place], Alyssa Reeksobert J, MD Cardiology Atrial septal defect +4 more Dx Follow-up; Referred by Gordan PaymentGrisso, Alyssa A., MD Reason for Visit   Additional Documentation  Vitals:  BP 146/72 Important   (BP Location: Right Arm, Patient Position: Sitting) Pulse 62 Ht 4' 8.5" (1.435 m) Wt 87.9 kg SpO2 96% BMI 42.68 kg/m BSA 1.87 m  Flowsheets:  NEWS, MEWS Score, Anthropometrics   Encounter Info:  Billing Info, History, Allergies, Detailed Report    All Notes    Patient Instructions by Alyssa Rosales, Alyssa I, RN at 11/24/2021 3:40 PM  Author: Samson Rosales, Alyssa I, RN Author Type: Registered Nurse Filed: 11/24/2021  4:34 PM  Note Status: Signed Cosign: Cosign Not Required Encounter Date: 11/24/2021  Editor: Alyssa Rosales, Alyssa I, RN (Registered Nurse)               Medication Instructions:  Your physician recommends that you continue on your current medications as directed. Please refer to the Current Medication list given to you today.   *If you need a refill on your cardiac medications before your next appointment, please call your pharmacy*     Lab Work: None If you have labs (blood work) drawn today and your tests are completely normal, you will receive your results only by: MyChart Message (if you have MyChart) OR A paper copy in the mail If you have any lab test that is abnormal or we need to change your treatment, we will call you to review the results.     Testing/Procedures: None     Follow-Up: At [facility], you and your health needs are our priority.  As part of our continuing mission to provide you with exceptional heart care, we have created designated Provider Care Teams.  These Care Teams include your primary Cardiologist (physician) and Advanced Practice Providers (APPs -  Physician Assistants and Nurse Practitioners) who all work together to provide you with the care you need, when you need it.   We recommend signing up for the  patient portal called "MyChart".  Sign up information is provided on this After Visit Summary.  MyChart is used to connect with patients for Virtual Visits (Telemedicine).  Patients are able to view lab/test results, encounter notes, upcoming appointments, etc.  Non-urgent messages can be sent to your provider as well.   To learn more about what you can do with MyChart, go to [email or web address].     Your next appointment:   2 month(s)   The format for your next appointment:   In Person   Provider:   Gypsy Balsamobert Krasowski, MD      Other Instructions None          Progress Notes by Alyssa Rosales, Alyssa J, MD at 11/24/2021 3:40 PM  Author: Georgeanna Rosales, Alyssa J, MD Author Type: Physician Filed: 11/24/2021  4:20 PM  Note Status: Signed Cosign: Cosign Not Required Encounter Date: 11/24/2021  Editor: Alyssa Rosales, Alyssa J, MD (Physician)                Cardiology Office Note:     Date:  11/24/2021    ID:  Alyssa Rosales, DOB April 11, 1951, MRN [id number]   PCP:  Gordan PaymentGrisso, Alyssa A., MD      Cardiologist:  Alyssa Balsamobert Krasowski, MD     Referring MD: Gordan PaymentGrisso, Alyssa A., MD       Chief Complaint  Patient presents with   Follow-up  Follow-up hemodynamically significant ASD   History of Present Illness:  Alyssa Rosales is a 71 y.o. female  female with past medical history significant for coronary artery disease only luminal disease based on cardiac cath from 2018, ascending aortic aneurysm measuring 43 mm, essential hypertension, dyslipidemia.  She showed up in my office last week day before her elective hip replacement surgery.  Because of inability to walk because of her hip pain as well as the fact that she does have ascending aortic aneurysm we decided to do a quick echocardiogram which showed large ASD she was noted to have normal pulmonary pressure, right ventricle right atrium is already enlarged.  Shunt is 1-2.18.  She comes today to my office for follow-up She is coming today to my  office for follow-up overall she is in much better mood than last time last time in my office she was crying because she want to go back to work.  She described a little shortness of breath but she said overall she is doing well.  There is no chest pain tightness squeezing pressure burning chest.       Past Medical History:  Diagnosis Date   Abdominal aortic aneurysm (AAA) without rupture 09/01/2017   Arthritis     Coronary artery disease involving native coronary artery of native heart without angina pectoris 02/09/2017   Edema, lower extremity     GERD without esophagitis 01/07/2016    Last Assessment & Plan:  Continue with the PPI and have refilled this for her   History of kidney problems     History of kidney stones     Hyperlipidemia 01/27/2017    Overview:  Added automatically from request for surgery [id number]   Hypertension     Joint pain     Kidney stone     Moderate persistent asthma without complication 08/10/2016   OSA (obstructive sleep apnea) 09/30/2016    Follows with pulmonary is seen 12/19 for FU bipap 21/15   Vitamin D deficiency             Past Surgical History:  Procedure Laterality Date   AMPUTATION Right 10/21/2020    Procedure: AMPUTATION RIGHT MIDDLE FINGER;  Surgeon: Cindee Salt, MD;  Location: [facility];  Service: Orthopedics;  Laterality: Right;  IV REGIONAL FOREARM BLOCK, BIER BLOCK   CORONARY ANGIOPLASTY       INCISION AND DRAINAGE Right 11/14/2020    Procedure: INCISION AND DRAINAGE RIGHT MIDDLE FINGER;  Surgeon: Cindee Salt, MD;  Location: [facility];  Service: Orthopedics;  Laterality: Right;   kidney stone removal       RECTAL SURGERY       spur removal       TOOTH EXTRACTION          Current Medications:     Current Meds  Medication Sig   acetaminophen-codeine (TYLENOL #3) 300-30 MG tablet Take 1-2 tablets by mouth every 8 (eight) hours as needed. (Patient taking differently: Take 1-2 tablets by mouth every 8  (eight) hours as needed for moderate pain or severe pain.)   albuterol (VENTOLIN HFA) 108 (90 Base) MCG/ACT inhaler Inhale 2 puffs into the lungs every 6 (six) hours as needed for wheezing or shortness of breath.   amLODipine (NORVASC) 2.5 MG tablet Take 2.5 mg by mouth daily.   Apoaequorin (PREVAGEN) 10 MG CAPS Take 10 mg by mouth daily.   APPLE CIDER VINEGAR PO Take 2 tablets by mouth daily. Unknown strength   Cyanocobalamin (B-12) 5000 MCG CAPS Take 5,000 mcg by mouth  daily.   ibuprofen (ADVIL) 800 MG tablet Take 800 mg by mouth every 8 (eight) hours as needed for mild pain.   loperamide (IMODIUM) 2 MG capsule Take 2-4 mg by mouth as needed for diarrhea or loose stools.   losartan (COZAAR) 100 MG tablet Take 100 mg by mouth daily.   meclizine (ANTIVERT) 25 MG tablet Take 25 mg by mouth 3 (three) times daily as needed for dizziness.   metoprolol succinate (TOPROL-XL) 25 MG 24 hr tablet TAKE ONE (1) TABLET BY MOUTH ONCE DAILY (Patient taking differently: Take 25 mg by mouth daily.)   niacin 500 MG tablet Take 500 mg by mouth at bedtime.   nitroGLYCERIN (NITROSTAT) 0.4 MG SL tablet Place 1 tablet (0.4 mg total) under the tongue every 5 (five) minutes as needed for up to 10 doses for chest pain.   pantoprazole (PROTONIX) 40 MG tablet Take 40 mg by mouth daily.   torsemide (DEMADEX) 10 MG tablet Take 1 tablet by mouth 2 (two) times daily.   traMADol (ULTRAM) 50 MG tablet Take 1 tablet (50 mg total) by mouth every 6 (six) hours as needed. (Patient taking differently: Take 50 mg by mouth every 8 (eight) hours as needed for moderate pain or severe pain.)   Vitamin D-Vitamin K (VITAMIN K2-VITAMIN D3 PO) Take 1 capsule by mouth daily.      Allergies:   Amoxicillin-pot clavulanate, Penicillins, Propoxyphene, and Sulfamethoxazole    Social History         Socioeconomic History   Marital status: Divorced      Spouse name: Not on file   Number of children: Not on file   Years of education: Not on  file   Highest education level: Not on file  Occupational History   Occupation: [employer]   Tobacco Use   Smoking status: Never   Smokeless tobacco: Never  Vaping Use   Vaping Use: Never used  Substance and Sexual Activity   Alcohol use: Never   Drug use: Never   Sexual activity: Not on file  Other Topics Concern   Not on file  Social History Narrative   Not on file    Social Determinants of Health    Financial Resource Strain: Not on file  Food Insecurity: Not on file  Transportation Needs: Not on file  Physical Activity: Not on file  Stress: Not on file  Social Connections: Not on file      Family History: The patient's family history includes Alzheimer's disease in her mother; Diabetes in her mother; Hyperlipidemia in her father and mother; Hypertension in her father and mother. There is no history of Breast cancer. ROS:   Please see the history of present illness.    All 14 point review of systems negative except as described per history of present illness   EKGs/Labs/Other Studies Reviewed:         Recent Labs: 11/20/2021: BUN 24; Creatinine, Ser 0.74; Hemoglobin 12.4; Platelets 217; Potassium 3.9; Sodium 147  Recent Lipid Panel         Component Value Date/Time    CHOL 206 (H) 11/27/2019 1553    TRIG 90 11/27/2019 1553    HDL 57 11/27/2019 1553    CHOLHDL 4.1 11/09/2018 1040    LDLCALC 133 (H) 11/27/2019 1553      Physical Exam:     VS:  BP (!) 146/72 (BP Location: Right Arm, Patient Position: Sitting)    Pulse 62    Ht 4' 8.5" (1.435 m)  Wt 193 lb 12.8 oz (87.9 kg)    SpO2 96%    BMI 42.68 kg/m         Wt Readings from Last 3 Encounters:  11/24/21 193 lb 12.8 oz (87.9 kg)  11/12/21 193 lb 9.6 oz (87.8 kg)  09/14/21 190 lb (86.2 kg)      GEN:  Well nourished, well developed in no acute distress HEENT: Normal NECK: No JVD; No carotid bruits LYMPHATICS: No lymphadenopathy CARDIAC: RRR, holosystolic murmur grade 1/6 best heard right lower  portion of the sternum, no rubs, no gallops RESPIRATORY:  Clear to auscultation without rales, wheezing or rhonchi  ABDOMEN: Soft, non-tender, non-distended MUSCULOSKELETAL:  No edema; No deformity  SKIN: Warm and dry LOWER EXTREMITIES: no swelling NEUROLOGIC:  Alert and oriented x 3 PSYCHIATRIC:  Normal affect    ASSESSMENT:     1. Atrial septal defect   2. Coronary artery disease involving native coronary artery of native heart without angina pectoris   3. Abdominal aortic aneurysm (AAA) without rupture, unspecified part   4. Essential hypertension   5. Dyslipidemia     PLAN:     In order of problems listed above:   ASD which appears to be hemodynamically significant pulmonary pressure is normal however right ventricle appears to be enlarged she is scheduled to have transesophageal echocardiogram to assess feasibility of ASD for repair with Amplatzer device.  She understand the procedure including all risk benefits as well as alternatives which I reviewed with her again Coronary artery disease by remote problems.  Last stress test done in summer 2021 was normal. Essential hypertension, blood pressure controlled today we will continue present management. Dyslipidemia we will recheck her fasting lipid profile and then will initiate therapy for it.     Medication Adjustments/Labs and Tests Ordered: Current medicines are reviewed at length with the patient today.  Concerns regarding medicines are outlined above.  No orders of the defined types were placed in this encounter.   Medication changes: No orders of the defined types were placed in this encounter.     Signed, Alyssa Lea, MD, [facility] 11/24/2021 4:17 PM    [facility]      For TEE; no changes Olga Millers

## 2021-11-26 NOTE — Discharge Instructions (Signed)

## 2021-11-27 ENCOUNTER — Encounter (HOSPITAL_COMMUNITY): Payer: Self-pay | Admitting: Cardiology

## 2021-12-19 ENCOUNTER — Other Ambulatory Visit: Payer: Self-pay | Admitting: Orthopaedic Surgery

## 2021-12-22 ENCOUNTER — Telehealth: Payer: Self-pay

## 2021-12-22 DIAGNOSIS — Q242 Cor triatriatum: Secondary | ICD-10-CM

## 2021-12-22 DIAGNOSIS — Q211 Atrial septal defect, unspecified: Secondary | ICD-10-CM

## 2021-12-22 DIAGNOSIS — R0602 Shortness of breath: Secondary | ICD-10-CM

## 2021-12-22 NOTE — Telephone Encounter (Addendum)
Discussed with patient. She understands her appointment tomorrow is cancelled and her procedure next week is postponed indefinitely until cardiac CT is completed to better assess cardiac anatomy.  CT scheduled 12/30/2021. The patient will update labs 12/28/2021.  Instructions reviewed.  The patient was grateful for call and agrees with plan.

## 2021-12-22 NOTE — Telephone Encounter (Signed)
Per Dr. Excell Seltzer, "I had a long talk with Alyssa Rosales today about [facility]. She has more complicated anatomy than I initially understood. 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 There is also a small area of flow coming into the left atrium that might be a fistula or some other anomaly. We need to get a cardiac CTA on her and cancel her closure for 3/2.  I think she has an appointment with KT tomorrow but I don't think there is any reason for her to come in for that. I called her this morning and left a message for her to call me back."  Attempted to call patient to discuss.  Left message to call back.

## 2021-12-22 NOTE — Addendum Note (Signed)
Addended by: Harland German A on: 12/22/2021 04:55 PM   Modules accepted: Orders

## 2021-12-23 ENCOUNTER — Ambulatory Visit: Payer: Medicare Other

## 2021-12-28 ENCOUNTER — Other Ambulatory Visit: Payer: Self-pay

## 2021-12-28 ENCOUNTER — Other Ambulatory Visit: Payer: Medicare Other | Admitting: *Deleted

## 2021-12-28 DIAGNOSIS — R0602 Shortness of breath: Secondary | ICD-10-CM

## 2021-12-28 DIAGNOSIS — Q211 Atrial septal defect, unspecified: Secondary | ICD-10-CM

## 2021-12-29 LAB — BASIC METABOLIC PANEL
BUN/Creatinine Ratio: 23 (ref 12–28)
BUN: 19 mg/dL (ref 8–27)
CO2: 24 mmol/L (ref 20–29)
Calcium: 9.6 mg/dL (ref 8.7–10.3)
Chloride: 110 mmol/L — ABNORMAL HIGH (ref 96–106)
Creatinine, Ser: 0.84 mg/dL (ref 0.57–1.00)
Glucose: 109 mg/dL — ABNORMAL HIGH (ref 70–99)
Potassium: 3.7 mmol/L (ref 3.5–5.2)
Sodium: 146 mmol/L — ABNORMAL HIGH (ref 134–144)
eGFR: 74 mL/min/{1.73_m2} (ref >59)

## 2021-12-31 ENCOUNTER — Other Ambulatory Visit: Payer: Self-pay

## 2021-12-31 ENCOUNTER — Encounter (HOSPITAL_COMMUNITY): Payer: Medicare Other

## 2021-12-31 ENCOUNTER — Ambulatory Visit (HOSPITAL_COMMUNITY): Admit: 2021-12-31 | Payer: Medicare Other | Admitting: Cardiovascular Disease

## 2021-12-31 ENCOUNTER — Ambulatory Visit (HOSPITAL_COMMUNITY)
Admission: RE | Admit: 2021-12-31 | Discharge: 2021-12-31 | Disposition: A | Discharge status: Home / Self Care | Payer: Medicare Other | Source: Ambulatory Visit | Attending: Cardiovascular Disease | Admitting: Cardiovascular Disease

## 2021-12-31 DIAGNOSIS — Q211 Atrial septal defect, unspecified: Secondary | ICD-10-CM | POA: Diagnosis present

## 2021-12-31 DIAGNOSIS — R0602 Shortness of breath: Secondary | ICD-10-CM | POA: Diagnosis present

## 2021-12-31 DIAGNOSIS — Q242 Cor triatriatum: Secondary | ICD-10-CM | POA: Insufficient documentation

## 2021-12-31 IMAGING — CT CT HEART MORP W/ CTA COR W/ SCORE W/ CA W/CM &/OR W/O CM
2 of 8 series · 4 of 20 positions shown, 5 images · IV contrast (APPLIED)
Comparison: Prior CTA of the chest on [DATE]
COMPARISON: Prior CTA of the chest on [DATE]

Addendum:
EXAM:
OVER-READ INTERPRETATION  CT CHEST

The following report is an over-read performed by radiologist Dr.
over-read does not include interpretation of cardiac or coronary
anatomy or pathology. The cardiac CTA interpretation by the
cardiologist is attached.
CLINICAL DATA: ASD Sizing for Closure
Cardiac CT/CTA
TECHNIQUE: The patient was scanned on a Siemens Force [REDACTED]ice  scanner.

[Series 9: best syst · axial · 0.39mm/px · z∈[-221,-182]mm · 2 of 291 slices shown, 3 images]
[im 97/291  vessel]
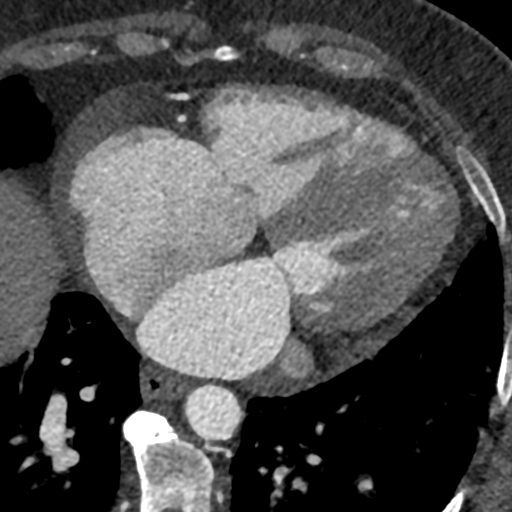
[im 97/291  lung]
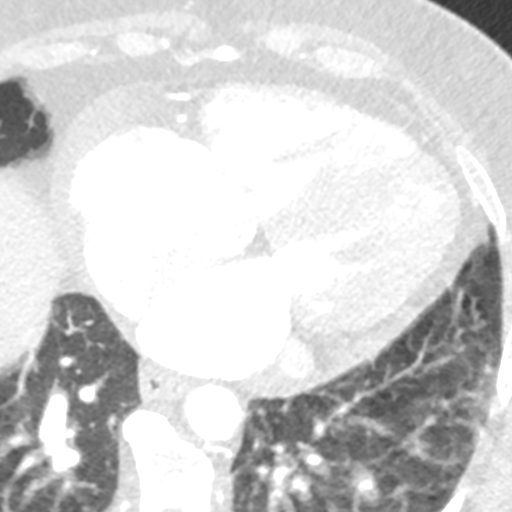
[im 194/291  vessel]
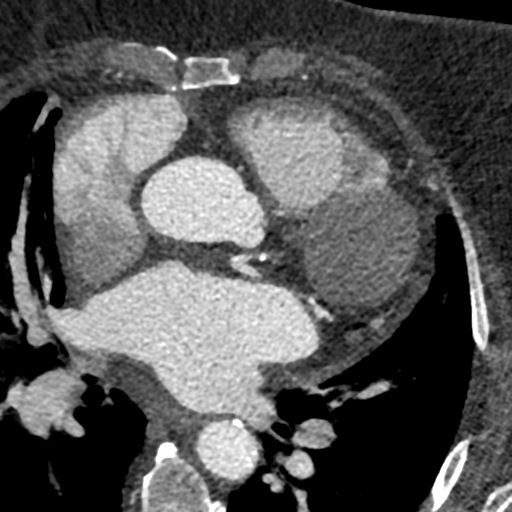

[Series 10: best diast · axial · 0.39mm/px · z∈[-221,-182]mm · 2 of 291 slices shown]
[im 97/291  vessel]
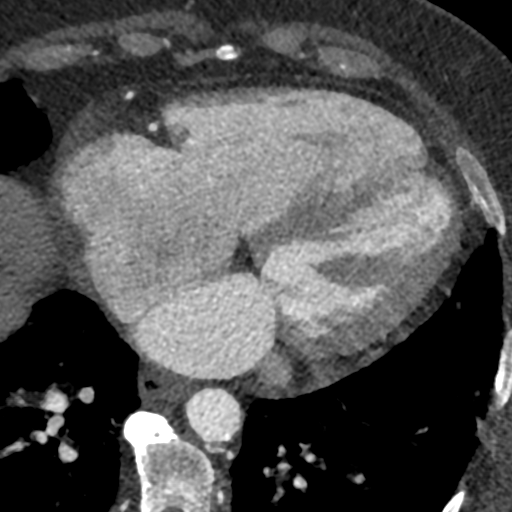
[im 194/291  vessel]
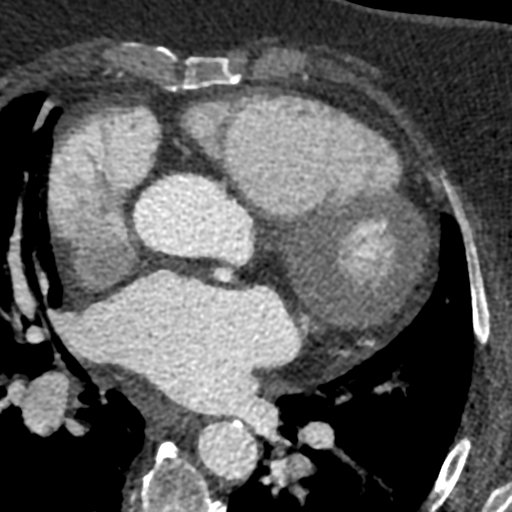

[4 of 20 positions shown; findings below may reference images not displayed]

FINDINGS: Vascular: Stable aneurysmal disease of the ascending thoracic aorta
measuring up to approximately 4.4 cm in greatest caliber. Central
pulmonary artery dilatation with the main pulmonary artery measuring
up to approximately 3.7 cm.

Mediastinum/Nodes: Visualized mediastinum and hilar regions
demonstrate no lymphadenopathy or masses. Probable small hiatal
hernia.

Lungs/Pleura: Visualized lungs show no evidence of pulmonary edema,
consolidation, pneumothorax, nodule or pleural fluid.

Upper Abdomen: No acute abnormality.

Musculoskeletal: No chest wall mass or suspicious bone lesions
identified.
IMPRESSION: 1. Stable aneurysmal disease of the ascending thoracic aorta
measuring up to approximately 4.4 cm in greatest caliber. Recommend
annual imaging followup by CTA or MRA. This recommendation follows
[XK] ACCF/AHA/AATS/ACR/ASA/SCA/SOREN/SOREN/SOREN/SOREN Guidelines for the
Diagnosis and Management of Patients with Thoracic Aortic Disease.
Circulation. [XK]; 121: E266-e369. Aortic aneurysm NOS ([XK]-[XK])
2. Dilated central pulmonary arteries are suggestive of some degree
of underlying pulmonary hypertension.
3. Probable small hiatal hernia.
FINDINGS: A 120 kV prospective scan was triggered in the ascending thoracic
aorta at 140 HU's. Gantry rotation speed was 250 msecs and
collimation was .6 mm. No beta blockade and no NTG was given. The 3D
data set was reconstructed for best systolic and diastolic phases
along with delayed images of the SOREN Images analyzed on a dedicated
work station using MPR, MIP and VRT modes. The patient received 80
cc of contrast.

Moderate bi atrial enlargement No SOREN thrombus Moderate RVE. Trivial
pericardial effusion lateral to the LV. Normal PV anatomy Normal
coronary sinus draining into the RA dilated MPA 3.7 cm dilated
ascending thoracic aorta 4.3 cm Large secundum ASD measuring 1.8 cm

Aortic Rim: 15.7 mm

Posterior Rim: 13 mm

Inferior Rim: 18.1 mm

Superior Rim 15.9 mm
IMPRESSION: 1.  Large atrial secundum ASD measuring 1.8 cm

2.  Adequate rims for Amplatz closure see measurements above

3.  Moderate SOREN and RVE

4.  Normal PV anatomy

5.  No SOREN thrombus

6.  Trivial pericardial effusion lateral to the LV

7.  Dilated ascending thoracic aorta 4.3 cm

SOREN

*** End of Addendum ***
EXAM:
OVER-READ INTERPRETATION  CT CHEST

The following report is an over-read performed by radiologist Dr.
over-read does not include interpretation of cardiac or coronary
anatomy or pathology. The cardiac CTA interpretation by the
cardiologist is attached.
FINDINGS: Vascular: Stable aneurysmal disease of the ascending thoracic aorta
measuring up to approximately 4.4 cm in greatest caliber. Central
pulmonary artery dilatation with the main pulmonary artery measuring
up to approximately 3.7 cm.

Mediastinum/Nodes: Visualized mediastinum and hilar regions
demonstrate no lymphadenopathy or masses. Probable small hiatal
hernia.

Lungs/Pleura: Visualized lungs show no evidence of pulmonary edema,
consolidation, pneumothorax, nodule or pleural fluid.

Upper Abdomen: No acute abnormality.

Musculoskeletal: No chest wall mass or suspicious bone lesions
identified.
IMPRESSION: 1. Stable aneurysmal disease of the ascending thoracic aorta
measuring up to approximately 4.4 cm in greatest caliber. Recommend
annual imaging followup by CTA or MRA. This recommendation follows
[XK] ACCF/AHA/AATS/ACR/ASA/SCA/SOREN/SOREN/SOREN/SOREN Guidelines for the
Diagnosis and Management of Patients with Thoracic Aortic Disease.
Circulation. [XK]; 121: [id number]. Aortic aneurysm NOS ([XK]-[XK])
2. Dilated central pulmonary arteries are suggestive of some degree
of underlying pulmonary hypertension.
3. Probable small hiatal hernia.

## 2021-12-31 SURGERY — ATRIAL SEPTAL DEFECT (ASD) CLOSURE
Anesthesia: LOCAL

## 2021-12-31 MED ORDER — NITROGLYCERIN 0.4 MG SL SUBL
0.8000 mg | SUBLINGUAL_TABLET | Freq: Once | SUBLINGUAL | Status: AC
  Administered 2021-12-31: 0.8 mg via SUBLINGUAL

## 2021-12-31 MED ORDER — IOHEXOL 350 MG/ML SOLN
95.0000 mL | Freq: Once | INTRAVENOUS | Status: AC | PRN
  Administered 2021-12-31: 95 mL via INTRAVENOUS

## 2021-12-31 MED ORDER — NITROGLYCERIN 0.4 MG SL SUBL
SUBLINGUAL_TABLET | SUBLINGUAL | Status: AC
  Filled 2021-12-31: qty 2

## 2022-01-14 ENCOUNTER — Encounter: Payer: Self-pay | Admitting: Cardiology

## 2022-01-14 ENCOUNTER — Ambulatory Visit (INDEPENDENT_AMBULATORY_CARE_PROVIDER_SITE_OTHER): Payer: Medicare Other | Admitting: Cardiology

## 2022-01-14 ENCOUNTER — Other Ambulatory Visit: Payer: Self-pay

## 2022-01-14 VITALS — BP 160/90 | HR 66 | Ht <= 58 in | Wt 200.0 lb

## 2022-01-14 DIAGNOSIS — I7121 Aneurysm of the ascending aorta, without rupture: Secondary | ICD-10-CM | POA: Diagnosis not present

## 2022-01-14 DIAGNOSIS — I10 Essential (primary) hypertension: Secondary | ICD-10-CM

## 2022-01-14 DIAGNOSIS — Q211 Atrial septal defect, unspecified: Secondary | ICD-10-CM

## 2022-01-14 NOTE — Patient Instructions (Signed)
Medication Instructions:  °Your physician recommends that you continue on your current medications as directed. Please refer to the Current Medication list given to you today. ° °*If you need a refill on your cardiac medications before your next appointment, please call your pharmacy* ° ° °Lab Work: °None °If you have labs (blood work) drawn today and your tests are completely normal, you will receive your results only by: °MyChart Message (if you have MyChart) OR °A paper copy in the mail °If you have any lab test that is abnormal or we need to change your treatment, we will call you to review the results. ° ° °Testing/Procedures: °None ° ° °Follow-Up: °At [facility], you and your health needs are our priority.  As part of our continuing mission to provide you with exceptional heart care, we have created designated Provider Care Teams.  These Care Teams include your primary Cardiologist (physician) and Advanced Practice Providers (APPs -  Physician Assistants and Nurse Practitioners) who all work together to provide you with the care you need, when you need it. ° °We recommend signing up for the patient portal called "MyChart".  Sign up information is provided on this After Visit Summary.  MyChart is used to connect with patients for Virtual Visits (Telemedicine).  Patients are able to view lab/test results, encounter notes, upcoming appointments, etc.  Non-urgent messages can be sent to your provider as well.   °To learn more about what you can do with MyChart, go to [email or web address].   ° °Your next appointment:   °3 month(s) ° °The format for your next appointment:   °In Person ° °Provider:   °Robert Krasowski, MD  ° ° °Other Instructions °None ° °

## 2022-01-14 NOTE — Progress Notes (Signed)
?Cardiology Office Note:   ? ?Date:  01/14/2022  ? ?ID:  Alyssa Rosales, DOB 09-02-1951, MRN [id number] ? ?PCP:  [name], MD  ?Cardiologist:  [name], MD   ? ?Referring MD: [name], MD  ? ?Chief Complaint  ?Patient presents with  ? Discuss CT scan  ? ? ?History of Present Illness:   ? ?Alyssa Rosales is a 71 y.o. female  with past medical history significant for coronary artery disease only luminal disease based on cardiac cath from 2018, ascending aortic aneurysm measuring 43 mm, essential hypertension, dyslipidemia.  She showed up in my office last week day before her elective hip replacement surgery.  Because of inability to walk because of her hip pain as well as the fact that she does have ascending aortic aneurysm we decided to do a quick echocardiogram which showed large ASD she was noted to have normal pulmonary pressure, right ventricle right atrium is already enlarged.  Shunt is 1-2.18.  ?She is coming today to my office in follow-up overall seems to be doing good.  Like always very cheerful and happy.  Denies having cardiac complaints, no palpitation no shortness of breath no swelling of lower extremities.  Again the biggest complaint of pain in her hip and actually we initiated this conversation before after she was scheduled to have echocardiogram for evaluation before hip replacement.  She was scheduled to have a cardiac catheterization procedure however that being consult and we wanted to have coronary CT angio done first.  Now it test is available.  I will communicate to our structural heart disease team that we can proceed. ? ?Past Medical History:  ?Diagnosis Date  ? Abdominal aortic aneurysm (AAA) without rupture 09/01/2017  ? Arthritis   ? Coronary artery disease involving native coronary artery of native heart without angina pectoris 02/09/2017  ? Edema, lower extremity   ? GERD without esophagitis 01/07/2016  ? Last Assessment & Plan:  Continue with the PPI and  have refilled this for her  ? History of kidney problems   ? History of kidney stones   ? Hyperlipidemia 01/27/2017  ? Overview:  Added automatically from request for surgery [id number]  ? Hypertension   ? Joint pain   ? Kidney stone   ? Moderate persistent asthma without complication 08/10/2016  ? OSA (obstructive sleep apnea) 09/30/2016  ? Follows with pulmonary is seen 12/19 for FU bipap 21/15  ? Vitamin D deficiency   ? ? ?Past Surgical History:  ?Procedure Laterality Date  ? AMPUTATION Right 10/21/2020  ? Procedure: AMPUTATION RIGHT MIDDLE FINGER;  Surgeon: Cindee Salt, MD;  Location: [facility];  Service: Orthopedics;  Laterality: Right;  IV REGIONAL FOREARM BLOCK, BIER BLOCK  ? CORONARY ANGIOPLASTY    ? INCISION AND DRAINAGE Right 11/14/2020  ? Procedure: INCISION AND DRAINAGE RIGHT MIDDLE FINGER;  Surgeon: Cindee Salt, MD;  Location: [facility];  Service: Orthopedics;  Laterality: Right;  ? kidney stone removal    ? RECTAL SURGERY    ? spur removal    ? TEE WITHOUT CARDIOVERSION N/A 11/26/2021  ? Procedure: TRANSESOPHAGEAL ECHOCARDIOGRAM (TEE);  Surgeon: Lewayne Bunting, MD;  Location: [facility];  Service: Cardiovascular;  Laterality: N/A;  ? TOOTH EXTRACTION    ? ? ?Current Medications: ?Current Meds  ?Medication Sig  ? Acetaminophen-Codeine 300-30 MG tablet Take 1-2 tablets by mouth every 8 (eight) hours as needed.  ? albuterol (VENTOLIN HFA) 108 (90 Base) MCG/ACT inhaler  Inhale 2 puffs into the lungs every 6 (six) hours as needed for wheezing or shortness of breath.  ? amLODipine (NORVASC) 2.5 MG tablet Take 2.5 mg by mouth daily.  ? Apoaequorin (PREVAGEN) 10 MG CAPS Take 10 mg by mouth daily.  ? APPLE CIDER VINEGAR PO Take 2 tablets by mouth daily. Unknown strength  ? Cyanocobalamin (B-12) 5000 MCG CAPS Take 5,000 mcg by mouth daily.  ? ibuprofen (ADVIL) 800 MG tablet Take 800 mg by mouth every 8 (eight) hours as needed for mild pain.  ? loperamide (IMODIUM) 2 MG capsule  Take 2-4 mg by mouth as needed for diarrhea or loose stools.  ? losartan (COZAAR) 100 MG tablet Take 100 mg by mouth daily.  ? meclizine (ANTIVERT) 25 MG tablet Take 25 mg by mouth 3 (three) times daily as needed for dizziness.  ? metoprolol succinate (TOPROL-XL) 25 MG 24 hr tablet TAKE ONE (1) TABLET BY MOUTH ONCE DAILY (Patient taking differently: Take 25 mg by mouth daily.)  ? niacin 500 MG tablet Take 500 mg by mouth at bedtime.  ? nitroGLYCERIN (NITROSTAT) 0.4 MG SL tablet Place 1 tablet (0.4 mg total) under the tongue every 5 (five) minutes as needed for up to 10 doses for chest pain.  ? pantoprazole (PROTONIX) 40 MG tablet Take 40 mg by mouth daily.  ? torsemide (DEMADEX) 10 MG tablet Take 1 tablet by mouth 2 (two) times daily.  ? traMADol (ULTRAM) 50 MG tablet Take 1 tablet (50 mg total) by mouth every 6 (six) hours as needed. (Patient taking differently: Take 50 mg by mouth every 8 (eight) hours as needed for moderate pain or severe pain.)  ? Vitamin D-Vitamin K (VITAMIN K2-VITAMIN D3 PO) Take 1 capsule by mouth daily.  ?  ? ?Allergies:   Amoxicillin-pot clavulanate, Penicillins, Propoxyphene, and Sulfamethoxazole  ? ?Social History  ? ?Socioeconomic History  ? Marital status: Divorced  ?  Spouse name: Not on file  ? Number of children: Not on file  ? Years of education: Not on file  ? Highest education level: Not on file  ?Occupational History  ? Occupation: [employer]   ?Tobacco Use  ? Smoking status: Never  ? Smokeless tobacco: Never  ?Vaping Use  ? Vaping Use: Never used  ?Substance and Sexual Activity  ? Alcohol use: Never  ? Drug use: Never  ? Sexual activity: Not on file  ?Other Topics Concern  ? Not on file  ?Social History Narrative  ? Not on file  ? ?Social Determinants of Health  ? ?Financial Resource Strain: Not on file  ?Food Insecurity: Not on file  ?Transportation Needs: Not on file  ?Physical Activity: Not on file  ?Stress: Not on file  ?Social Connections: Not on file  ?  ? ?Family  History: ?The patient's family history includes Alzheimer's disease in her mother; Diabetes in her mother; Hyperlipidemia in her father and mother; Hypertension in her father and mother. There is no history of Breast cancer. ?ROS:   ?Please see the history of present illness.    ?All 14 point review of systems negative except as described per history of present illness ? ?EKGs/Labs/Other Studies Reviewed:   ? ? ? ?Recent Labs: ?11/20/2021: Hemoglobin 12.4; Platelets 217 ?12/28/2021: BUN 19; Creatinine, Ser 0.84; Potassium 3.7; Sodium 146  ?Recent Lipid Panel ?   ?Component Value Date/Time  ? CHOL 206 (H) 11/27/2019 1553  ? TRIG 90 11/27/2019 1553  ? HDL 57 11/27/2019 1553  ? CHOLHDL 4.1 11/09/2018  1040  ? LDLCALC 133 (H) 11/27/2019 1553  ? ? ?Physical Exam:   ? ?VS:  BP (!) 160/90 (BP Location: Left Arm, Patient Position: Sitting)   Pulse 66   Ht 4' 8.5" (1.435 m)   Wt 200 lb (90.7 kg)   SpO2 98%   BMI 44.05 kg/m?    ? ?Wt Readings from Last 3 Encounters:  ?01/14/22 200 lb (90.7 kg)  ?11/26/21 193 lb 12.8 oz (87.9 kg)  ?11/24/21 193 lb 12.8 oz (87.9 kg)  ?  ? ?GEN:  Well nourished, well developed in no acute distress ?HEENT: Normal ?NECK: No JVD; No carotid bruits ?LYMPHATICS: No lymphadenopathy ?CARDIAC: RRR, holosystolic murmur grade 1/6 to 2/6 best heard right border of the sternum, no rubs, no gallops ?RESPIRATORY:  Clear to auscultation without rales, wheezing or rhonchi  ?ABDOMEN: Soft, non-tender, non-distended ?MUSCULOSKELETAL:  No edema; No deformity  ?SKIN: Warm and dry ?LOWER EXTREMITIES: no swelling ?NEUROLOGIC:  Alert and oriented x 3 ?PSYCHIATRIC:  Normal affect  ? ?ASSESSMENT:   ? ?1. Atrial septal defect   ?2. Aneurysm of ascending aorta without rupture   ?3. Essential hypertension   ?4. Morbid obesity (HCC)   ? ?PLAN:   ? ?In order of problems listed above: ? ?ASD quite large and required fixing.  Plan will be to send messages to our structural heart team so she can be schedule and procedure can  be done.  She understands procedure and she is eager to proceed. ?Aneurysm of descending aorta.  Measuring 4.4 cm based on CT from December 31, 2021.  Stable. ?Essential hypertension: Blood pressure elevated today which

## 2022-02-03 ENCOUNTER — Ambulatory Visit: Payer: Medicare Other

## 2022-02-05 ENCOUNTER — Other Ambulatory Visit: Payer: Self-pay | Admitting: Orthopaedic Surgery

## 2022-02-08 ENCOUNTER — Other Ambulatory Visit: Payer: Self-pay | Admitting: Orthopaedic Surgery

## 2022-02-16 DIAGNOSIS — Z9889 Other specified postprocedural states: Secondary | ICD-10-CM

## 2022-02-16 DIAGNOSIS — D62 Acute posthemorrhagic anemia: Secondary | ICD-10-CM

## 2022-02-16 DIAGNOSIS — G8918 Other acute postprocedural pain: Secondary | ICD-10-CM

## 2022-02-16 HISTORY — DX: Acute posthemorrhagic anemia: D62

## 2022-02-16 HISTORY — DX: Other specified postprocedural states: Z98.890

## 2022-02-16 HISTORY — DX: Other acute postprocedural pain: G89.18

## 2022-02-20 DIAGNOSIS — I4891 Unspecified atrial fibrillation: Secondary | ICD-10-CM

## 2022-02-20 DIAGNOSIS — Z8679 Personal history of other diseases of the circulatory system: Secondary | ICD-10-CM | POA: Insufficient documentation

## 2022-02-20 HISTORY — DX: Unspecified atrial fibrillation: I48.91

## 2022-02-20 HISTORY — DX: Personal history of other diseases of the circulatory system: Z86.79

## 2022-03-15 ENCOUNTER — Ambulatory Visit (INDEPENDENT_AMBULATORY_CARE_PROVIDER_SITE_OTHER): Payer: Medicare Other | Admitting: Cardiology

## 2022-03-15 ENCOUNTER — Encounter: Payer: Self-pay | Admitting: Cardiology

## 2022-03-15 VITALS — BP 142/90 | HR 82 | Ht <= 58 in | Wt 190.2 lb

## 2022-03-15 DIAGNOSIS — Z9889 Other specified postprocedural states: Secondary | ICD-10-CM | POA: Diagnosis not present

## 2022-03-15 DIAGNOSIS — D696 Thrombocytopenia, unspecified: Secondary | ICD-10-CM

## 2022-03-15 DIAGNOSIS — I714 Abdominal aortic aneurysm, without rupture, unspecified: Secondary | ICD-10-CM | POA: Diagnosis not present

## 2022-03-15 DIAGNOSIS — E785 Hyperlipidemia, unspecified: Secondary | ICD-10-CM | POA: Diagnosis not present

## 2022-03-15 HISTORY — DX: Thrombocytopenia, unspecified: D69.6

## 2022-03-15 NOTE — Progress Notes (Signed)
?Cardiology Office Note:   ? ?Date:  03/15/2022  ? ?ID:  Alyssa Rosales, DOB 03/25/1951, MRN [id number] ? ?PCP:  [name], MD  ?Cardiologist:  [name], MD   ? ?Referring MD: [name], MD  ? ?No chief complaint on file. ? ? ?History of Present Illness:   ? ?Alyssa Rosales is a 71 y.o. female with past medical history significant ASD, scheduled for recent surgical closure of ASD that was [facility], essential hypertension, coronary artery disease but only luminal based on cardiac catheterization, ascending aortic aneurysm measuring 43 mm.  She did have episode of atrial fibrillation after surgery with amiodarone amiodarone discontinued on 516.  She is doing well with now.  Comes today to my office for follow-up she is happy that she gets surgery like always she talks a lot and she is disappointed because she cannot go back to work right away.  Also asking me about potentially having her hip surgery done I told her we need to wait.  There is some swelling of lower extremities for which she takes torsemide.  Otherwise no palpitation no shortness of breath chest pain tightness squeezing pressure burning chest. ? ?Past Medical History:  ?Diagnosis Date  ? Abdominal aortic aneurysm (AAA) without rupture (HCC) 09/01/2017  ? Arthritis   ? Coronary artery disease involving native coronary artery of native heart without angina pectoris 02/09/2017  ? Edema, lower extremity   ? GERD without esophagitis 01/07/2016  ? Last Assessment & Plan:  Continue with the PPI and have refilled this for her  ? History of kidney problems   ? History of kidney stones   ? Hyperlipidemia 01/27/2017  ? Overview:  Added automatically from request for surgery [phone]  ? Hypertension   ? Joint pain   ? Kidney stone   ? Moderate persistent asthma without complication 08/10/2016  ? OSA (obstructive sleep apnea) 09/30/2016  ? Follows with pulmonary is seen 12/19 for FU bipap 21/15  ? Vitamin D deficiency   ? ? ?Past  Surgical History:  ?Procedure Laterality Date  ? AMPUTATION Right 10/21/2020  ? Procedure: AMPUTATION RIGHT MIDDLE FINGER;  Surgeon: Cindee Salt, MD;  Location: [facility];  Service: Orthopedics;  Laterality: Right;  IV REGIONAL FOREARM BLOCK, BIER BLOCK  ? CORONARY ANGIOPLASTY    ? INCISION AND DRAINAGE Right 11/14/2020  ? Procedure: INCISION AND DRAINAGE RIGHT MIDDLE FINGER;  Surgeon: Cindee Salt, MD;  Location: [facility];  Service: Orthopedics;  Laterality: Right;  ? kidney stone removal    ? RECTAL SURGERY    ? spur removal    ? TEE WITHOUT CARDIOVERSION N/A 11/26/2021  ? Procedure: TRANSESOPHAGEAL ECHOCARDIOGRAM (TEE);  Surgeon: Lewayne Bunting, MD;  Location: [facility] ENDOSCOPY;  Service: Cardiovascular;  Laterality: N/A;  ? TOOTH EXTRACTION    ? ? ?Current Medications: ?Current Meds  ?Medication Sig  ? acetaminophen (TYLENOL) 500 MG tablet Take 500 mg by mouth every 6 (six) hours as needed for mild pain or moderate pain.  ? albuterol (VENTOLIN HFA) 108 (90 Base) MCG/ACT inhaler Inhale 2 puffs into the lungs every 6 (six) hours as needed for wheezing or shortness of breath.  ? Apoaequorin (PREVAGEN) 10 MG CAPS Take 10 mg by mouth daily.  ? APPLE CIDER VINEGAR PO Take 2 tablets by mouth daily. Unknown strength  ? BIOTIN PO Take 1 tablet by mouth daily.  ? Cyanocobalamin (B-12) 5000 MCG CAPS Take 5,000 mcg by mouth daily.  ?  ELIQUIS 5 MG TABS tablet Take 5 mg by mouth in the morning and at bedtime.  ? fluticasone (FLONASE) 50 MCG/ACT nasal spray Place 1 spray into both nostrils daily.  ? ibuprofen (ADVIL) 800 MG tablet Take 800 mg by mouth every 8 (eight) hours as needed for mild pain.  ? loperamide (IMODIUM) 2 MG capsule Take 2-4 mg by mouth as needed for diarrhea or loose stools.  ? meclizine (ANTIVERT) 25 MG tablet Take 25 mg by mouth 3 (three) times daily as needed for dizziness.  ? niacin 500 MG tablet Take 500 mg by mouth at bedtime.  ? ondansetron (ZOFRAN) 4 MG tablet Take 4 mg by  mouth every 8 (eight) hours as needed for nausea.  ? pantoprazole (PROTONIX) 40 MG tablet Take 40 mg by mouth daily.  ? torsemide (DEMADEX) 10 MG tablet Take 1 tablet by mouth daily.  ? traMADol (ULTRAM) 50 MG tablet Take 1 tablet (50 mg total) by mouth every 6 (six) hours as needed. (Patient taking differently: Take 50 mg by mouth every 8 (eight) hours as needed for moderate pain or severe pain.)  ?  ? ?Allergies:   Amoxicillin-pot clavulanate, Penicillins, Propoxyphene, Sulfa antibiotics, and Sulfamethoxazole  ? ?Social History  ? ?Socioeconomic History  ? Marital status: Divorced  ?  Spouse name: Not on file  ? Number of children: Not on file  ? Years of education: Not on file  ? Highest education level: Not on file  ?Occupational History  ? Occupation: [employer]   ?Tobacco Use  ? Smoking status: Never  ? Smokeless tobacco: Never  ?Vaping Use  ? Vaping Use: Never used  ?Substance and Sexual Activity  ? Alcohol use: Never  ? Drug use: Never  ? Sexual activity: Not on file  ?Other Topics Concern  ? Not on file  ?Social History Narrative  ? Not on file  ? ?Social Determinants of Health  ? ?Financial Resource Strain: Not on file  ?Food Insecurity: Not on file  ?Transportation Needs: Not on file  ?Physical Activity: Not on file  ?Stress: Not on file  ?Social Connections: Not on file  ?  ? ?Family History: ?The patient's family history includes Alzheimer's disease in her mother; Diabetes in her mother; Hyperlipidemia in her father and mother; Hypertension in her father and mother. There is no history of Breast cancer. ?ROS:   ?Please see the history of present illness.    ?All 14 point review of systems negative except as described per history of present illness ? ?EKGs/Labs/Other Studies Reviewed:   ? ? ? ?Recent Labs: ?11/20/2021: Hemoglobin 12.4; Platelets 217 ?12/28/2021: BUN 19; Creatinine, Ser 0.84; Potassium 3.7; Sodium 146  ?Recent Lipid Panel ?   ?Component Value Date/Time  ? CHOL 206 (H) 11/27/2019 1553  ?  TRIG 90 11/27/2019 1553  ? HDL 57 11/27/2019 1553  ? CHOLHDL 4.1 11/09/2018 1040  ? LDLCALC 133 (H) 11/27/2019 1553  ? ? ?Physical Exam:   ? ?VS:  BP (!) 142/90 (BP Location: Left Arm, Patient Position: Sitting)   Pulse 82   Ht 4' 8.5" (1.435 m)   Wt 190 lb 3.2 oz (86.3 kg)   SpO2 97%   BMI 41.89 kg/m?    ? ?Wt Readings from Last 3 Encounters:  ?03/15/22 190 lb 3.2 oz (86.3 kg)  ?01/14/22 200 lb (90.7 kg)  ?11/26/21 193 lb 12.8 oz (87.9 kg)  ?  ? ?GEN:  Well nourished, well developed in no acute distress ?HEENT: Normal ?NECK: No  JVD; No carotid bruits ?LYMPHATICS: No lymphadenopathy ?CARDIAC: RRR, no murmurs, no rubs, no gallops ?RESPIRATORY:  Clear to auscultation without rales, wheezing or rhonchi  ?ABDOMEN: Soft, non-tender, non-distended ?MUSCULOSKELETAL:  No edema; No deformity  ?SKIN: Warm and dry ?LOWER EXTREMITIES: no swelling ?NEUROLOGIC:  Alert and oriented x 3 ?PSYCHIATRIC:  Normal affect  ? ?ASSESSMENT:   ? ?1. S/P atrial septal defect closure, surgical   ?2. Dyslipidemia   ?3. Abdominal aortic aneurysm (AAA) without rupture, unspecified part (HCC)   ? ?PLAN:   ? ?In order of problems listed above: ? ?Status post atrial septal defect repair surgical, doing well recovering quite nicely old wound checked looking good.  Without any evidence of infection.  She is doing very well ?Essential hypertension blood pressure slightly elevated today but she says she is excited to see me.  We will continue monitoring. ?Paroxysmal atrial fibrillation only episode after surgery.  We will continue anticoagulation for now but then in the future we will reconsider potentially stopping ?Ascending arctic aneurysm.  We will continue monitoring ? ? ?Medication Adjustments/Labs and Tests Ordered: ?Current medicines are reviewed at length with the patient today.  Concerns regarding medicines are outlined above.  ?No orders of the defined types were placed in this encounter. ? ?Medication changes: No orders of the defined  types were placed in this encounter. ? ? ?Signed, ?Georgeanna Leaobert J. Samra Pesch, MD, [facility] CenterFACC ?03/15/2022 4:25 PM    ?[facility] ?

## 2022-03-15 NOTE — Addendum Note (Signed)
Addended by: Jacobo Forest D on: 03/15/2022 04:44 PM ? ? Modules accepted: Orders ? ?

## 2022-03-15 NOTE — Patient Instructions (Signed)
Medication Instructions:  ?Your physician recommends that you continue on your current medications as directed. Please refer to the Current Medication list given to you today.  ?*If you need a refill on your cardiac medications before your next appointment, please call your pharmacy* ? ? ?Lab Work: ?BMP today ?If you have labs (blood work) drawn today and your tests are completely normal, you will receive your results only by: ?MyChart Message (if you have MyChart) OR ?A paper copy in the mail ?If you have any lab test that is abnormal or we need to change your treatment, we will call you to review the results. ? ? ?Testing/Procedures: ?None Ordered ? ? ?Follow-Up: ?At [facility], you and your health needs are our priority.  As part of our continuing mission to provide you with exceptional heart care, we have created designated Provider Care Teams.  These Care Teams include your primary Cardiologist (physician) and Advanced Practice Providers (APPs -  Physician Assistants and Nurse Practitioners) who all work together to provide you with the care you need, when you need it. ? ?We recommend signing up for the patient portal called "MyChart".  Sign up information is provided on this After Visit Summary.  MyChart is used to connect with patients for Virtual Visits (Telemedicine).  Patients are able to view lab/test results, encounter notes, upcoming appointments, etc.  Non-urgent messages can be sent to your provider as well.   ?To learn more about what you can do with MyChart, go to [email or web address].   ? ?Your next appointment:   ? Has appt scheduled ? ?The format for your next appointment:   ?In Person ? ?Provider:   ?[name], MD  ? ? ?Other Instructions ?NA  ?

## 2022-03-16 LAB — BASIC METABOLIC PANEL
BUN/Creatinine Ratio: 41 — ABNORMAL HIGH (ref 12–28)
BUN: 41 mg/dL — ABNORMAL HIGH (ref 8–27)
CO2: 23 mmol/L (ref 20–29)
Calcium: 9.4 mg/dL (ref 8.7–10.3)
Chloride: 105 mmol/L (ref 96–106)
Creatinine, Ser: 1.01 mg/dL — ABNORMAL HIGH (ref 0.57–1.00)
Glucose: 99 mg/dL (ref 70–99)
Potassium: 3.4 mmol/L — ABNORMAL LOW (ref 3.5–5.2)
Sodium: 144 mmol/L (ref 134–144)
eGFR: 59 mL/min/{1.73_m2} — ABNORMAL LOW (ref >59)

## 2022-03-18 ENCOUNTER — Telehealth: Payer: Self-pay

## 2022-03-18 DIAGNOSIS — I10 Essential (primary) hypertension: Secondary | ICD-10-CM

## 2022-03-18 MED ORDER — POTASSIUM CHLORIDE ER 10 MEQ PO TBCR: 10.0000 meq | EXTENDED_RELEASE_TABLET | Freq: Every day | ORAL | 2 refills | Status: DC

## 2022-03-18 NOTE — Telephone Encounter (Signed)
Patient notified of results and recommendation and agreed with plan. Rx sent, lab order on file

## 2022-03-18 NOTE — Telephone Encounter (Signed)
-----   Message from Georgeanna Lea, MD sent at 03/18/2022  2:49 PM EDT ----- Potassium on the lower side.  Please start 10 mEq of potassium daily p.o. Chem-7 need to be done in 1 week

## 2022-03-22 ENCOUNTER — Other Ambulatory Visit: Payer: Self-pay | Admitting: Internal Medicine

## 2022-03-26 ENCOUNTER — Telehealth: Payer: Self-pay | Admitting: Cardiology

## 2022-03-26 NOTE — Telephone Encounter (Signed)
Pt just had heart surgery on April 18th. Pt would like to know how long she should wait before she has hip surgery.  Best number to reach pt [phone]

## 2022-03-26 NOTE — Telephone Encounter (Signed)
Full vm 

## 2022-03-26 NOTE — Telephone Encounter (Signed)
Please advise 

## 2022-03-27 LAB — BASIC METABOLIC PANEL
BUN/Creatinine Ratio: 33 — ABNORMAL HIGH (ref 12–28)
BUN: 31 mg/dL — ABNORMAL HIGH (ref 8–27)
CO2: 25 mmol/L (ref 20–29)
Calcium: 9 mg/dL (ref 8.7–10.3)
Chloride: 106 mmol/L (ref 96–106)
Creatinine, Ser: 0.93 mg/dL (ref 0.57–1.00)
Glucose: 91 mg/dL (ref 70–99)
Potassium: 3.4 mmol/L — ABNORMAL LOW (ref 3.5–5.2)
Sodium: 142 mmol/L (ref 134–144)
eGFR: 65 mL/min/{1.73_m2} (ref >59)

## 2022-03-30 ENCOUNTER — Telehealth: Payer: Self-pay

## 2022-03-30 NOTE — Telephone Encounter (Signed)
Recommendations reviewed with pt as per Dr. Krasowski's note.  Pt verbalized understanding and had no additional questions.  

## 2022-03-30 NOTE — Telephone Encounter (Signed)
[place] application  faxed.

## 2022-04-01 ENCOUNTER — Telehealth: Payer: Self-pay

## 2022-04-01 DIAGNOSIS — E876 Hypokalemia: Secondary | ICD-10-CM

## 2022-04-01 NOTE — Telephone Encounter (Signed)
Results reviewed with pt as per Dr. Wendy Poet note. Encouraged to increase potassium to 20 q day and come in for BMP in 1 week. Lab req sent to lab. Pt verbalized understanding and had no additional questions. Routed to PCP

## 2022-04-10 LAB — BASIC METABOLIC PANEL
BUN/Creatinine Ratio: 26 (ref 12–28)
BUN: 26 mg/dL (ref 8–27)
CO2: 24 mmol/L (ref 20–29)
Calcium: 9.3 mg/dL (ref 8.7–10.3)
Chloride: 107 mmol/L — ABNORMAL HIGH (ref 96–106)
Creatinine, Ser: 1 mg/dL (ref 0.57–1.00)
Glucose: 87 mg/dL (ref 70–99)
Potassium: 4.1 mmol/L (ref 3.5–5.2)
Sodium: 144 mmol/L (ref 134–144)
eGFR: 60 mL/min/{1.73_m2} (ref >59)

## 2022-04-12 ENCOUNTER — Telehealth: Payer: Self-pay

## 2022-04-12 NOTE — Telephone Encounter (Signed)
Left message for pt on VM regarding lab results.

## 2022-04-13 ENCOUNTER — Other Ambulatory Visit: Payer: Self-pay | Admitting: Cardiology

## 2022-04-15 ENCOUNTER — Telehealth: Payer: Self-pay

## 2022-04-15 NOTE — Telephone Encounter (Signed)
[employer] Application denied for the following reason: Patient had not meet 3% OOP Rx expense. I called and LM

## 2022-04-19 ENCOUNTER — Other Ambulatory Visit: Payer: Self-pay

## 2022-04-19 ENCOUNTER — Telehealth: Payer: Self-pay

## 2022-04-19 MED ORDER — POTASSIUM CHLORIDE CRYS ER 20 MEQ PO TBCR: 20.0000 meq | EXTENDED_RELEASE_TABLET | Freq: Every day | ORAL | 3 refills | Status: DC

## 2022-04-19 NOTE — Telephone Encounter (Signed)
Refilled K+ 20 mEq *90 ref x 3. Dose increase on 03-29-22 per Dr. Bing Matter.

## 2022-04-22 ENCOUNTER — Telehealth: Payer: Self-pay | Admitting: Orthopaedic Surgery

## 2022-04-22 ENCOUNTER — Telehealth: Payer: Self-pay | Admitting: Cardiology

## 2022-04-22 NOTE — Telephone Encounter (Signed)
Does patient believe Eliquis is the cause?  It is not a new medication for her.  Recommend she try cortisone cream to see if this helps

## 2022-04-22 NOTE — Telephone Encounter (Signed)
Pt c/o medication issue:  1. Name of Medication: Eliquis   2. How are you currently taking this medication (dosage and times per day)? As directed  3. Are you having a reaction (difficulty breathing--STAT)? Patches on skin  4. What is your medication issue? Patient said she has spots where she broke out on her arms and it is itchy. She has large areas and she is worried about it

## 2022-04-22 NOTE — Telephone Encounter (Signed)
Please advise 

## 2022-04-22 NOTE — Telephone Encounter (Signed)
Pt stated that her arms are itching and have a rash. She is wondering if the Eliquis could be causing the rash and itching. She has been on Eliquis since April. Pharm D recommended Cortisone Cream. Please advise.

## 2022-04-22 NOTE — Telephone Encounter (Signed)
Pt called and would like to know if she could get somethin for her hip pain.   CB [phone]

## 2022-05-07 ENCOUNTER — Ambulatory Visit: Payer: Medicare Other | Admitting: Cardiology

## 2022-05-07 ENCOUNTER — Encounter: Payer: Self-pay | Admitting: Cardiology

## 2022-05-07 ENCOUNTER — Ambulatory Visit (INDEPENDENT_AMBULATORY_CARE_PROVIDER_SITE_OTHER): Payer: Medicare Other | Admitting: Cardiology

## 2022-05-07 VITALS — BP 120/62 | HR 66 | Resp 97 | Ht <= 58 in | Wt 202.2 lb

## 2022-05-07 DIAGNOSIS — I7121 Aneurysm of the ascending aorta, without rupture: Secondary | ICD-10-CM | POA: Diagnosis not present

## 2022-05-07 DIAGNOSIS — I9789 Other postprocedural complications and disorders of the circulatory system, not elsewhere classified: Secondary | ICD-10-CM | POA: Diagnosis not present

## 2022-05-07 DIAGNOSIS — Q212 Atrioventricular septal defect, unspecified as to partial or complete: Secondary | ICD-10-CM

## 2022-05-07 DIAGNOSIS — E785 Hyperlipidemia, unspecified: Secondary | ICD-10-CM | POA: Diagnosis not present

## 2022-05-07 DIAGNOSIS — Z9889 Other specified postprocedural states: Secondary | ICD-10-CM

## 2022-05-07 DIAGNOSIS — I2510 Atherosclerotic heart disease of native coronary artery without angina pectoris: Secondary | ICD-10-CM

## 2022-05-07 DIAGNOSIS — I4891 Unspecified atrial fibrillation: Secondary | ICD-10-CM

## 2022-05-07 NOTE — Addendum Note (Signed)
Addended by: Baldo Ash D on: 05/07/2022 04:15 PM   Modules accepted: Orders

## 2022-05-07 NOTE — Patient Instructions (Signed)
Medication Instructions:  Your physician recommends that you continue on your current medications as directed. Please refer to the Current Medication list given to you today.  *If you need a refill on your cardiac medications before your next appointment, please call your pharmacy*   Lab Work: None Ordered If you have labs (blood work) drawn today and your tests are completely normal, you will receive your results only by: MyChart Message (if you have MyChart) OR A paper copy in the mail If you have any lab test that is abnormal or we need to change your treatment, we will call you to review the results.   Testing/Procedures: Your physician has requested that you have an echocardiogram. Echocardiography is a painless test that uses sound waves to create images of your heart. It provides your doctor with information about the size and shape of your heart and how well your heart's chambers and valves are working. This procedure takes approximately one hour. There are no restrictions for this procedure.    Follow-Up: At [facility], you and your health needs are our priority.  As part of our continuing mission to provide you with exceptional heart care, we have created designated Provider Care Teams.  These Care Teams include your primary Cardiologist (physician) and Advanced Practice Providers (APPs -  Physician Assistants and Nurse Practitioners) who all work together to provide you with the care you need, when you need it.  We recommend signing up for the patient portal called "MyChart".  Sign up information is provided on this After Visit Summary.  MyChart is used to connect with patients for Virtual Visits (Telemedicine).  Patients are able to view lab/test results, encounter notes, upcoming appointments, etc.  Non-urgent messages can be sent to your provider as well.   To learn more about what you can do with MyChart, go to [email or web address].    Your next appointment:   3  month(s)  The format for your next appointment:   In Person  Provider:   Robert Krasowski, MD    Other Instructions NA  

## 2022-05-07 NOTE — Progress Notes (Signed)
Cardiology Office Note:    Date:  05/07/2022   ID:  KHRISTA Rosales, DOB 1951/07/13, MRN [id number]  PCP:  Alyssa Payment., MD  Cardiologist:  [name], MD    Referring MD: Alyssa Payment., MD   Chief Complaint  Patient presents with   discuss hip replacement that has not been approve    History of Present Illness:    Alyssa Rosales is a 71 y.o. female  with past medical history significant ASD, scheduled for recent surgical closure of ASD that was done at [facility], essential hypertension, coronary artery disease but only luminal based on cardiac catheterization, ascending aortic aneurysm measuring 43 mm.  She did have episode of atrial fibrillation after surgery with amiodarone amiodarone discontinued on 5/16.   She comes to my office for follow-up today.  Overall doing well cardiac wise denies of any palpitations no chest pain tightness squeezing pressure burning chest.  Biggest complaint is issue with her hips she would like to have a hip replacement however appropriate weight after open heart surgery for better recovery however after admission overall she is looking good denies have any palpitations concerned about prices of Eliquis   Past Medical History:  Diagnosis Date   Abdominal aortic aneurysm (AAA) without rupture (HCC) 09/01/2017   Arthritis    Coronary artery disease involving native coronary artery of native heart without angina pectoris 02/09/2017   Edema, lower extremity    GERD without esophagitis 01/07/2016   Last Assessment & Plan:  Continue with the PPI and have refilled this for her   History of kidney problems    History of kidney stones    Hyperlipidemia 01/27/2017   Overview:  Added automatically from request for surgery [id number]   Hypertension    Joint pain    Kidney stone    Moderate persistent asthma without complication 08/10/2016   OSA (obstructive sleep apnea) 09/30/2016   Follows with pulmonary is seen 12/19 for FU bipap 21/15   Vitamin  D deficiency     Past Surgical History:  Procedure Laterality Date   AMPUTATION Right 10/21/2020   Procedure: AMPUTATION RIGHT MIDDLE FINGER;  Surgeon: Cindee Salt, MD;  Location: [facility];  Service: Orthopedics;  Laterality: Right;  IV REGIONAL FOREARM BLOCK, BIER BLOCK   CORONARY ANGIOPLASTY     INCISION AND DRAINAGE Right 11/14/2020   Procedure: INCISION AND DRAINAGE RIGHT MIDDLE FINGER;  Surgeon: Cindee Salt, MD;  Location: [facility];  Service: Orthopedics;  Laterality: Right;   kidney stone removal     RECTAL SURGERY     spur removal     TEE WITHOUT CARDIOVERSION N/A 11/26/2021   Procedure: TRANSESOPHAGEAL ECHOCARDIOGRAM (TEE);  Surgeon: Lewayne Bunting, MD;  Location: [facility];  Service: Cardiovascular;  Laterality: N/A;   TOOTH EXTRACTION      Current Medications: Current Meds  Medication Sig   albuterol (VENTOLIN HFA) 108 (90 Base) MCG/ACT inhaler Inhale 2 puffs into the lungs every 6 (six) hours as needed for wheezing or shortness of breath.   budesonide-formoterol (SYMBICORT) 160-4.5 MCG/ACT inhaler Inhale 2 puffs into the lungs 2 (two) times daily.   cyanocobalamin 2000 MCG tablet Take 2,500 mcg by mouth daily.   ELIQUIS 5 MG TABS tablet Take 5 mg by mouth in the morning and at bedtime.   fluticasone (FLONASE) 50 MCG/ACT nasal spray Place 1 spray into both nostrils daily.   loperamide (IMODIUM A-D) 2 MG tablet Take 2 mg by mouth 4 (four)  times daily as needed for diarrhea or loose stools.   losartan (COZAAR) 100 MG tablet Take 100 mg by mouth daily.   meclizine (ANTIVERT) 25 MG tablet Take 25 mg by mouth 3 (three) times daily as needed for dizziness.   metoprolol succinate (TOPROL-XL) 25 MG 24 hr tablet Take 25 mg by mouth daily.   nitroGLYCERIN (NITROSTAT) 0.4 MG SL tablet Place 0.4 mg under the tongue every 5 (five) minutes as needed for chest pain.   ondansetron (ZOFRAN) 4 MG tablet Take 4 mg by mouth every 8 (eight) hours as needed  for nausea or vomiting.   OxyCODONE HCl, Abuse Deter, (OXAYDO) 5 MG TABA Take 1 tablet by mouth 2 (two) times daily as needed (pain).   pantoprazole (PROTONIX) 40 MG tablet Take 40 mg by mouth daily.   potassium chloride SA (KLOR-CON M20) 20 MEQ tablet Take 1 tablet (20 mEq total) by mouth daily.   torsemide (DEMADEX) 10 MG tablet Take 2 tablets by mouth daily.   [DISCONTINUED] amLODipine (NORVASC) 2.5 MG tablet Take 2.5 mg by mouth daily.     Allergies:   Amoxicillin-pot clavulanate, Penicillins, Propoxyphene, Sulfa antibiotics, and Sulfamethoxazole   Social History   Socioeconomic History   Marital status: Divorced    Spouse name: Not on file   Number of children: Not on file   Years of education: Not on file   Highest education level: Not on file  Occupational History   Occupation: [employer]   Tobacco Use   Smoking status: Never   Smokeless tobacco: Never  Vaping Use   Vaping Use: Never used  Substance and Sexual Activity   Alcohol use: Never   Drug use: Never   Sexual activity: Not on file  Other Topics Concern   Not on file  Social History Narrative   Not on file   Social Determinants of Health   Financial Resource Strain: Not on file  Food Insecurity: Not on file  Transportation Needs: Not on file  Physical Activity: Not on file  Stress: Not on file  Social Connections: Not on file     Family History: The patient's family history includes Alzheimer's disease in her mother; Diabetes in her mother; Hyperlipidemia in her father and mother; Hypertension in her father and mother. There is no history of Breast cancer. ROS:   Please see the history of present illness.    All 14 point review of systems negative except as described per history of present illness  EKGs/Labs/Other Studies Reviewed:      Recent Labs: 11/20/2021: Hemoglobin 12.4; Platelets 217 04/09/2022: BUN 26; Creatinine, Ser 1.00; Potassium 4.1; Sodium 144  Recent Lipid Panel    Component Value  Date/Time   CHOL 206 (H) 11/27/2019 1553   TRIG 90 11/27/2019 1553   HDL 57 11/27/2019 1553   CHOLHDL 4.1 11/09/2018 1040   LDLCALC 133 (H) 11/27/2019 1553    Physical Exam:    VS:  BP 120/62 (BP Location: Left Arm, Patient Position: Sitting)   Pulse 66   Resp (!) 97   Ht 4\' 8"  (1.422 m)   Wt 202 lb 3.2 oz (91.7 kg)   BMI 45.33 kg/m     Wt Readings from Last 3 Encounters:  05/07/22 202 lb 3.2 oz (91.7 kg)  03/15/22 190 lb 3.2 oz (86.3 kg)  01/14/22 200 lb (90.7 kg)     GEN:  Well nourished, well developed in no acute distress HEENT: Normal NECK: No JVD; No carotid bruits LYMPHATICS: No lymphadenopathy  CARDIAC: RRR, no murmurs, no rubs, no gallops RESPIRATORY:  Clear to auscultation without rales, wheezing or rhonchi  ABDOMEN: Soft, non-tender, non-distended MUSCULOSKELETAL:  No edema; No deformity  SKIN: Warm and dry LOWER EXTREMITIES: no swelling NEUROLOGIC:  Alert and oriented x 3 PSYCHIATRIC:  Normal affect   ASSESSMENT:    1. S/P atrial septal defect closure, surgical   2. Dyslipidemia   3. Aneurysm of ascending aorta without rupture ([place])   4. Postoperative atrial fibrillation (HCC)   5. Coronary artery disease involving native coronary artery of native heart without angina pectoris    PLAN:    In order of problems listed above:  Status post atrial septal defect repaired.  I will get echocardiogram from baseline.  We will continue present management. Dyslipidemia cholesterol is elevated with LDL of 133 HDL 57 likely she does not have any significant coronary artery disease based on cardiac catheterization previously.  She is reluctant to take any extra medications Aneurysm of ascending aorta 43 mm stable continue monitoring Postoperative A-fib.  I will do echocardiogram to look at the atrial size and then if there be no recurrences of atrial fibrillation we may put monitor if negative then will consider stopping Eliquis.  However overall I think she is at high  risk of recurrences of atrial fibrillation because of significant enlargement of the atrium that she got because of ASD. Coronary artery disease stable   Medication Adjustments/Labs and Tests Ordered: Current medicines are reviewed at length with the patient today.  Concerns regarding medicines are outlined above.  No orders of the defined types were placed in this encounter.  Medication changes: No orders of the defined types were placed in this encounter.   Signed, Park Liter, MD, [facility] 05/07/2022 3:59 PM    [place]

## 2022-05-19 ENCOUNTER — Encounter: Payer: Self-pay | Admitting: Orthopaedic Surgery

## 2022-05-19 ENCOUNTER — Ambulatory Visit (INDEPENDENT_AMBULATORY_CARE_PROVIDER_SITE_OTHER): Payer: Medicare Other | Admitting: Orthopaedic Surgery

## 2022-05-19 VITALS — Ht <= 58 in | Wt 202.4 lb

## 2022-05-19 DIAGNOSIS — I2510 Atherosclerotic heart disease of native coronary artery without angina pectoris: Secondary | ICD-10-CM | POA: Diagnosis not present

## 2022-05-19 DIAGNOSIS — M1612 Unilateral primary osteoarthritis, left hip: Secondary | ICD-10-CM

## 2022-05-19 DIAGNOSIS — M25552 Pain in left hip: Secondary | ICD-10-CM | POA: Diagnosis not present

## 2022-05-19 NOTE — Progress Notes (Signed)
The patient is a 71 year old female that I have not seen in a while.  We actually had her scheduled for a left hip replacement secondary to severe arthritis.  This was late last year.  She had been on a weight loss journey and gotten her BMI from 46 down to 40.  Unfortunately she did not had a significant aneurysm that required surgery with her heart.  She is now recovering from that.  She is on Eliquis.  She was told that she cannot have surgery for at least 6 months and this was from April of this year when she had her surgery.  She is able with a cane.  Her pain is severe and she is on oxycodone.  She cannot take anti-inflammatories due to being on blood thinning medications.  Her left hip pain is severe on range of motion.  And there is obviously arthritic changes.  She wears a knee brace on that side and is ambulate with a cane.  Right now there is really nothing that I can offer until she has clearance from a cardiac standpoint and vascular surgery standpoint for surgery in terms of the hip replaced on the left side.  She would have to be able to come off of blood thinning medications.  Also need her to lose more weight because there is concerned about the risk of infection.  I explained all of these things to her.  I would like to see her back in 3 months with a repeat weight and BMI calculation as well as a new AP pelvis and lateral of her left hip.  At the point where she is cleared for surgery we can consider getting her set up again.

## 2022-06-01 ENCOUNTER — Other Ambulatory Visit: Payer: Self-pay | Admitting: Cardiology

## 2022-06-01 DIAGNOSIS — I4891 Unspecified atrial fibrillation: Secondary | ICD-10-CM

## 2022-06-01 NOTE — Telephone Encounter (Unsigned)
Eliquis 5mg  refill request received. Patient is 71 years old, weight-91.8kg, Crea-1.00 on 04/09/2022, Diagnosis-Afib & S/P atrial septal defect closure, surgical, and last seen by Dr. 06/09/2022 on 05/07/2022. Dose is appropriate based on dosing criteria. Will send in refill to requested pharmacy.    Per last OV note: Postoperative A-fib.  I will do echocardiogram to look at the atrial size and then if there be no recurrences of atrial fibrillation we may put monitor if negative then will consider stopping Eliquis.  However overall I think she is at high risk of recurrences of atrial fibrillation because of significant enlargement of the atrium that she got because of ASD.  Called pt and advised refill was sent and advised that refill was sent and that since she is supposed to be on it for limited time to inquire about this at her echo results to see when she should stop. She stated she had 2 pills of eliquis left so she needs the refill until told to stop it. Advised refill sent and she will only use what is needed until echo is read.

## 2022-06-04 ENCOUNTER — Ambulatory Visit (INDEPENDENT_AMBULATORY_CARE_PROVIDER_SITE_OTHER): Payer: Medicare Other

## 2022-06-04 DIAGNOSIS — Z9889 Other specified postprocedural states: Secondary | ICD-10-CM

## 2022-06-04 DIAGNOSIS — Q212 Atrioventricular septal defect, unspecified as to partial or complete: Secondary | ICD-10-CM

## 2022-06-04 LAB — ECHOCARDIOGRAM COMPLETE
AV Mean grad: 4 mmHg
AV Peak grad: 6.8 mmHg
Ao pk vel: 1.3 m/s
Area-P 1/2: 3.27 cm2
S' Lateral: 3.5 cm

## 2022-06-07 ENCOUNTER — Telehealth: Payer: Self-pay

## 2022-06-07 NOTE — Telephone Encounter (Signed)
Results reviewed with pt as per Dr. Krasowski's note.  Pt verbalized understanding and had no additional questions. Routed to PCP  

## 2022-07-07 ENCOUNTER — Ambulatory Visit: Payer: Medicare Other | Admitting: Orthopaedic Surgery

## 2022-08-02 ENCOUNTER — Ambulatory Visit: Payer: Medicare Other | Attending: Cardiology | Admitting: Cardiology

## 2022-08-02 ENCOUNTER — Encounter: Payer: Self-pay | Admitting: Cardiology

## 2022-08-02 VITALS — BP 114/72 | HR 72 | Ht <= 58 in | Wt 198.4 lb

## 2022-08-02 DIAGNOSIS — Z9889 Other specified postprocedural states: Secondary | ICD-10-CM | POA: Diagnosis present

## 2022-08-02 DIAGNOSIS — E785 Hyperlipidemia, unspecified: Secondary | ICD-10-CM | POA: Diagnosis present

## 2022-08-02 DIAGNOSIS — R0609 Other forms of dyspnea: Secondary | ICD-10-CM | POA: Diagnosis present

## 2022-08-02 DIAGNOSIS — E7849 Other hyperlipidemia: Secondary | ICD-10-CM | POA: Diagnosis not present

## 2022-08-02 NOTE — Patient Instructions (Signed)
Medication Instructions:  Your physician recommends that you continue on your current medications as directed. Please refer to the Current Medication list given to you today.  *If you need a refill on your cardiac medications before your next appointment, please call your pharmacy*   Lab Work: None If you have labs (blood work) drawn today and your tests are completely normal, you will receive your results only by: MyChart Message (if you have MyChart) OR A paper copy in the mail If you have any lab test that is abnormal or we need to change your treatment, we will call you to review the results.   Testing/Procedures: Your physician has requested that you have an echocardiogram. Echocardiography is a painless test that uses sound waves to create images of your heart. It provides your doctor with information about the size and shape of your heart and how well your heart's chambers and valves are working. This procedure takes approximately one hour. There are no restrictions for this procedure.    Follow-Up: At [facility], you and your health needs are our priority.  As part of our continuing mission to provide you with exceptional heart care, we have created designated Provider Care Teams.  These Care Teams include your primary Cardiologist (physician) and Advanced Practice Providers (APPs -  Physician Assistants and Nurse Practitioners) who all work together to provide you with the care you need, when you need it.  We recommend signing up for the patient portal called "MyChart".  Sign up information is provided on this After Visit Summary.  MyChart is used to connect with patients for Virtual Visits (Telemedicine).  Patients are able to view lab/test results, encounter notes, upcoming appointments, etc.  Non-urgent messages can be sent to your provider as well.   To learn more about what you can do with MyChart, go to [email or web address].    Your next appointment:   6  month(s)  The format for your next appointment:   In Person  Provider:   Robert Krasowski, MD    Other Instructions   Important Information About Sugar       

## 2022-08-02 NOTE — Progress Notes (Signed)
Cardiology Office Note:    Date:  08/02/2022   ID:  Alyssa Rosales, DOB [birth year], MRN [id number]  PCP:  [name], MD  Cardiologist:  [name], MD    Referring MD: [name], MD   Chief Complaint  Patient presents with   Clearance status    History of Present Illness:    Alyssa Rosales is a 71 y.o. female with past medical history significant for ASD repair in April of this year at [facility], essential hypertension coronary disease but only limited based on cardiac catheterization, ascending aortic aneurysm measuring 43 mm, episode of atrial fibrillation that she had done after surgery with amiodarone being discontinued on May 16.  She is in my office today for follow-up.  She also would like to get hip surgery.  She left work she kept going and she cannot do good work because of hip problem.  Denies having cardiac complaints, there is no chest pain tightness squeezing pressure mid chest no palpitations dizziness swelling of lower extremities  Past Medical History:  Diagnosis Date   Abdominal aortic aneurysm (AAA) without rupture (HCC) 09/01/2017   Arthritis    Coronary artery disease involving native coronary artery of native heart without angina pectoris 02/09/2017   Edema, lower extremity    GERD without esophagitis 01/07/2016   Last Assessment & Plan:  Continue with the PPI and have refilled this for her   History of kidney problems    History of kidney stones    Hyperlipidemia 01/27/2017   Overview:  Added automatically from request for surgery [id number]   Hypertension    Joint pain    Kidney stone    Moderate persistent asthma without complication 08/10/2016   OSA (obstructive sleep apnea) 09/30/2016   Follows with pulmonary is seen 12/19 for FU bipap 21/15   Vitamin D deficiency     Past Surgical History:  Procedure Laterality Date   AMPUTATION Right 10/21/2020   Procedure: AMPUTATION RIGHT MIDDLE FINGER;  Surgeon: Cindee Salt, MD;  Location:  [facility];  Service: Orthopedics;  Laterality: Right;  IV REGIONAL FOREARM BLOCK, BIER BLOCK   CORONARY ANGIOPLASTY     INCISION AND DRAINAGE Right 11/14/2020   Procedure: INCISION AND DRAINAGE RIGHT MIDDLE FINGER;  Surgeon: Cindee Salt, MD;  Location: [facility];  Service: Orthopedics;  Laterality: Right;   kidney stone removal     RECTAL SURGERY     spur removal     TEE WITHOUT CARDIOVERSION N/A 11/26/2021   Procedure: TRANSESOPHAGEAL ECHOCARDIOGRAM (TEE);  Surgeon: Lewayne Bunting, MD;  Location: [facility];  Service: Cardiovascular;  Laterality: N/A;   TOOTH EXTRACTION      Current Medications: Current Meds  Medication Sig   albuterol (VENTOLIN HFA) 108 (90 Base) MCG/ACT inhaler Inhale 2 puffs into the lungs every 6 (six) hours as needed for wheezing or shortness of breath.   apixaban (ELIQUIS) 5 MG TABS tablet TAKE 1 TABLET (5 MG TOTAL) BY MOUTH EVERY 12 (TWELVE) HOURS FOR 60 DAYS (Patient taking differently: Take 5 mg by mouth 2 (two) times daily.)   aspirin EC 81 MG tablet Take 81 mg by mouth daily. Swallow whole.   budesonide-formoterol (SYMBICORT) 160-4.5 MCG/ACT inhaler Inhale 2 puffs into the lungs 2 (two) times daily.   cyanocobalamin 2000 MCG tablet Take 2,500 mcg by mouth daily.   fluticasone (FLONASE) 50 MCG/ACT nasal spray Place 1 spray into both nostrils daily.   loperamide (IMODIUM A-D) 2 MG tablet  Take 2 mg by mouth 4 (four) times daily as needed for diarrhea or loose stools.   losartan (COZAAR) 100 MG tablet Take 100 mg by mouth daily.   meclizine (ANTIVERT) 25 MG tablet Take 25 mg by mouth 3 (three) times daily as needed for dizziness.   metoprolol succinate (TOPROL-XL) 25 MG 24 hr tablet Take 25 mg by mouth daily.   nitroGLYCERIN (NITROSTAT) 0.4 MG SL tablet Place 0.4 mg under the tongue every 5 (five) minutes as needed for chest pain.   ondansetron (ZOFRAN) 4 MG tablet Take 4 mg by mouth every 8 (eight) hours as needed for nausea or  vomiting.   OxyCODONE HCl, Abuse Deter, (OXAYDO) 5 MG TABA Take 1 tablet by mouth 2 (two) times daily as needed (pain).   pantoprazole (PROTONIX) 40 MG tablet Take 40 mg by mouth daily.   potassium chloride SA (KLOR-CON M20) 20 MEQ tablet Take 1 tablet (20 mEq total) by mouth daily.   torsemide (DEMADEX) 10 MG tablet Take 2 tablets by mouth daily.     Allergies:   Amoxicillin-pot clavulanate, Penicillins, Propoxyphene, Sulfa antibiotics, and Sulfamethoxazole   Social History   Socioeconomic History   Marital status: Divorced    Spouse name: Not on file   Number of children: Not on file   Years of education: Not on file   Highest education level: Not on file  Occupational History   Occupation: [employer]   Tobacco Use   Smoking status: Never   Smokeless tobacco: Never  Vaping Use   Vaping Use: Never used  Substance and Sexual Activity   Alcohol use: Never   Drug use: Never   Sexual activity: Not on file  Other Topics Concern   Not on file  Social History Narrative   Not on file   Social Determinants of Health   Financial Resource Strain: Not on file  Food Insecurity: Not on file  Transportation Needs: Not on file  Physical Activity: Not on file  Stress: Not on file  Social Connections: Not on file     Family History: The patient's family history includes Alzheimer's disease in her mother; Diabetes in her mother; Hyperlipidemia in her father and mother; Hypertension in her father and mother. There is no history of Breast cancer. ROS:   Please see the history of present illness.    All 14 point review of systems negative except as described per history of present illness  EKGs/Labs/Other Studies Reviewed:      Recent Labs: 11/20/2021: Hemoglobin 12.4; Platelets 217 04/09/2022: BUN 26; Creatinine, Ser 1.00; Potassium 4.1; Sodium 144  Recent Lipid Panel    Component Value Date/Time   CHOL 206 (H) 11/27/2019 1553   TRIG 90 11/27/2019 1553   HDL 57 11/27/2019 1553    CHOLHDL 4.1 11/09/2018 1040   LDLCALC 133 (H) 11/27/2019 1553    Physical Exam:    VS:  BP 114/72 (BP Location: Left Arm, Patient Position: Sitting)   Pulse 72   Ht 4' 8.5" (1.435 m)   Wt 198 lb 6.4 oz (90 kg)   SpO2 97%   BMI 43.70 kg/m     Wt Readings from Last 3 Encounters:  08/02/22 198 lb 6.4 oz (90 kg)  05/19/22 202 lb 6.4 oz (91.8 kg)  05/07/22 202 lb 3.2 oz (91.7 kg)     GEN:  Well nourished, well developed in no acute distress HEENT: Normal NECK: No JVD; No carotid bruits LYMPHATICS: No lymphadenopathy CARDIAC: RRR, no murmurs, no rubs,  no gallops RESPIRATORY:  Clear to auscultation without rales, wheezing or rhonchi  ABDOMEN: Soft, non-tender, non-distended MUSCULOSKELETAL:  No edema; No deformity  SKIN: Warm and dry LOWER EXTREMITIES: no swelling NEUROLOGIC:  Alert and oriented x 3 PSYCHIATRIC:  Normal affect   ASSESSMENT:    1. S/P atrial septal defect closure, surgical   2. Other hyperlipidemia   3. Dyslipidemia   4. Dyspnea on exertion    PLAN:    In order of problems listed above:  Status post ASD repair.  Doing well from that point review I will ask her to have another echocardiogram done to check pericardial effusion.  Otherwise clinically she is doing very well Atrial fibrillation.  That happened after surgery interesting her last echocardiogram showed normal atrial size.  We discussed surprising.  I am suspecting her having paroxysmal of atrial fibrillation, therefore, that is the reason why I am keeping her coagulated Dyslipidemia I did review K PN which show me her LDL of 133, she always refused to take any medication for it.  We will continue this discussion. Dyspnea exertion denies having any   Medication Adjustments/Labs and Tests Ordered: Current medicines are reviewed at length with the patient today.  Concerns regarding medicines are outlined above.  No orders of the defined types were placed in this encounter.  Medication changes:  No orders of the defined types were placed in this encounter.   Signed, Park Liter, MD, [facility] 08/02/2022 3:15 PM    [place]

## 2022-08-02 NOTE — Addendum Note (Signed)
Addended by: Darrel Reach on: 08/02/2022 03:20 PM   Modules accepted: Orders

## 2022-08-09 ENCOUNTER — Ambulatory Visit (INDEPENDENT_AMBULATORY_CARE_PROVIDER_SITE_OTHER): Payer: Medicare Other

## 2022-08-09 ENCOUNTER — Encounter: Payer: Self-pay | Admitting: Physician Assistant

## 2022-08-09 ENCOUNTER — Ambulatory Visit (INDEPENDENT_AMBULATORY_CARE_PROVIDER_SITE_OTHER): Payer: Medicare Other | Admitting: Physician Assistant

## 2022-08-09 VITALS — Ht <= 58 in | Wt 195.0 lb

## 2022-08-09 DIAGNOSIS — M1612 Unilateral primary osteoarthritis, left hip: Secondary | ICD-10-CM

## 2022-08-09 DIAGNOSIS — I2510 Atherosclerotic heart disease of native coronary artery without angina pectoris: Secondary | ICD-10-CM

## 2022-08-09 NOTE — Progress Notes (Signed)
  HPI: Ms. Alyssa Rosales returns today for a weight check and an AP and lateral view of her left hip.  She has been working on weight loss.  She does have an appointment with her cardiologist this Thursday for her echocardiogram.  She still needs cardiac clearance to undergo left hip surgery.  She is on chronic Eliquis history of A-fib and history of atrial septal defect which she underwent surgery for. Regards to her hip she is having severe pain in the left.  She is using a cane to ambulate.  She states she is unable to exercise secondary to the pain in her left hip.  This makes it very hard for her to lose weight.  Review of systems: See HPI otherwise negative or noncontributory.  Physical exam: Height 4 foot 8/2 inches weight 195 pounds BMI 42.95.  General: No acute distress.  Mood and affect appropriate.  Ambulates with a cane and antalgic gait. Left hip severe pain with any attempts of range of motion.  Left knee is slightly hyper flexed and unable to actively or passively have this brought to full extension.  Lying down on the table area of surgical incision appears to be acceptable  for anterior hip surgery.  Radiographs: AP pelvis lateral view of the left hip shows severe end-stage arthritis.  Hips well located.  No acute fractures.  Impression: Severe end-stage arthritis left hip Obesity Chronic anticoagulation  Plan: She will see her cardiologist and let us know if she is cleared for surgery and can come off of her Eliquis for surgery.  She will continue to work on weight loss.  Once we have clearance from cardiology we will work on scheduling her for surgery.  Questions were encouraged and answered at length.

## 2022-08-12 ENCOUNTER — Ambulatory Visit: Payer: Medicare Other | Attending: Cardiology

## 2022-08-12 DIAGNOSIS — Z9889 Other specified postprocedural states: Secondary | ICD-10-CM | POA: Diagnosis not present

## 2022-08-12 DIAGNOSIS — R0609 Other forms of dyspnea: Secondary | ICD-10-CM | POA: Diagnosis present

## 2022-08-12 DIAGNOSIS — E7849 Other hyperlipidemia: Secondary | ICD-10-CM

## 2022-08-12 DIAGNOSIS — E785 Hyperlipidemia, unspecified: Secondary | ICD-10-CM | POA: Diagnosis not present

## 2022-08-12 LAB — ECHOCARDIOGRAM COMPLETE
AR max vel: 1.58 cm2
AV Area VTI: 1.55 cm2
AV Area mean vel: 1.67 cm2
AV Mean grad: 8 mmHg
AV Peak grad: 12.9 mmHg
Ao pk vel: 1.8 m/s
S' Lateral: 3.2 cm

## 2022-08-18 ENCOUNTER — Telehealth: Payer: Self-pay

## 2022-08-18 NOTE — Telephone Encounter (Signed)
   [name] Pre-operative Risk Assessment    Request for surgical clearance:  What type of surgery is being performed?  Left total hip replacement  When is this surgery scheduled? TBD   What type of clearance is required (medical clearance vs. Pharmacy clearance to hold med vs. Both)? Both  Are there any medications that need to be held prior to surgery and how long? Eliquis - 3 days prior  Practice name and name of physician performing surgery? [facility]( unknown physician  What is your office phone number: [phone]    7.   What is your office fax number: [phone]  8.   Anesthesia type (None, local, MAC, general) ? Spinal/General   Alyssa Rosales M Alyssa Rosales 08/18/2022, 1:34 PM  _________________________________________________________________   (provider comm days prior ents below)  days prior

## 2022-08-20 ENCOUNTER — Telehealth: Payer: Self-pay

## 2022-08-20 NOTE — Telephone Encounter (Signed)
Results reviewed with pt as per Dr. Krasowski's note.  Pt verbalized understanding and had no additional questions. Routed to PCP  

## 2022-08-25 NOTE — Telephone Encounter (Signed)
   Patient Name: Alyssa Rosales  DOB: 08/21/1951 MRN: [id number]  Primary Cardiologist: Jenne Campus, MD  Chart reviewed as part of pre-operative protocol coverage. Given past medical history and time since last visit, based on ACC/AHA guidelines, Marvena C Havey is at acceptable risk for the planned procedure without further cardiovascular testing.   Last seen 08/02/22 by Dr. Agustin Cree doing well from cardiac perspective recommended to follow up in 6 months.   Per pharmacy team and office protocol, may hold Eliquis 3 days prior to planned procedure.   The patient was advised that if she develops new symptoms prior to surgery to contact our office to arrange for a follow-up visit, and she verbalized understanding.  I will route this recommendation to the requesting party via Epic fax function and remove from pre-op pool.  Please call with questions.  Loel Dubonnet, NP 08/25/2022, 10:51 AM

## 2022-08-25 NOTE — Telephone Encounter (Signed)
[facility], is calling back to get update on if this patient has been cleared. Requesting call back.

## 2022-08-25 NOTE — Telephone Encounter (Signed)
Will send to pre op provider for review. Pt was just seen by Dr. Agustin Cree 08/20/22.

## 2022-08-25 NOTE — Telephone Encounter (Signed)
Called and confirmed with Sherri of [facility] that they were able to view clearance. Will remove from preop pool.   Loel Dubonnet, NP

## 2022-08-25 NOTE — Telephone Encounter (Signed)
Patient with diagnosis of A fib on Eliquis for anticoagulation.    Procedure: Left total hip replacement Date of procedure: TBD   CHA2DS2-VASc Score = 4  This indicates a 4.8% annual risk of stroke. The patient's score is based upon: CHF History: 0 HTN History: 1 Diabetes History: 0 Stroke History: 0 Vascular Disease History: 1 Age Score: 1 Gender Score: 1    CrCl 67mL/min using adjusted body weight Platelet count 222K   Per office protocol, patient can hold Eliquis for 3 days prior to procedure.    **This guidance is not considered finalized until pre-operative APP has relayed final recommendations.**

## 2022-08-27 ENCOUNTER — Other Ambulatory Visit: Payer: Self-pay

## 2022-09-06 NOTE — Pre-Procedure Instructions (Addendum)
Surgical Instructions    Your procedure is scheduled on September 14, 2022.  Report to [facility] Main Entrance "A" at 10:00 A.M., then check in with the Admitting office.  Call this number if you have problems the morning of surgery:  [phone]   If you have any questions prior to your surgery date call [phone]: Open Monday-Friday 8am-4pm    Remember:  Do not eat after midnight the night before your surgery  You may drink clear liquids until 9:00 AM the morning of your surgery.   Clear liquids allowed are: Water, Non-Citrus Juices (without pulp), Carbonated Beverages, Clear Tea, Black Coffee Only (NO MILK, CREAM OR POWDERED CREAMER of any kind), and Gatorade.  Patient Instructions  The night before surgery:  No food after midnight. ONLY clear liquids after midnight  The day of surgery (if you do NOT have diabetes):  Drink ONE (1) Pre-Surgery Clear Ensure by 9:00 AM the morning of surgery. Drink in one sitting. Do not sip.  This drink was given to you during your hospital  pre-op appointment visit.  Nothing else to drink after completing the  Pre-Surgery Clear Ensure.         If you have questions, please contact your surgeon's office.     Take these medicines the morning of surgery with A SIP OF WATER:  budesonide-formoterol (SYMBICORT) inhaler   fluticasone (FLONASE)   metoprolol succinate (TOPROL-XL)   pantoprazole (PROTONIX)     Take these medicines the morning of surgery with a sip of water AS NEEDED:  albuterol (VENTOLIN HFA) inhaler   meclizine (ANTIVERT)    nitroGLYCERIN (NITROSTAT)   ondansetron (ZOFRAN)   OxyCODONE   loperamide (IMODIUM A-D)    Follow your surgeon's instructions on when to stop Aspirin and apixaban (ELIQUIS).  If no instructions were given by your surgeon then you will need to call the office to get those instructions.     As of today, STOP taking any Aleve, Naproxen, Ibuprofen, Motrin, Advil, Goody's, BC's, all herbal medications,  fish oil, and all vitamins.                     Do NOT Smoke (Tobacco/Vaping) for 24 hours prior to your procedure.  If you use a CPAP at night, you may bring your mask/headgear for your overnight stay.   Contacts, glasses, piercing's, hearing aid's, dentures or partials may not be worn into surgery, please bring cases for these belongings.    For patients admitted to the hospital, discharge time will be determined by your treatment team.   Patients discharged the day of surgery will not be allowed to drive home, and someone needs to stay with them for 24 hours.  SURGICAL WAITING ROOM VISITATION Patients having surgery or a procedure may have no more than 2 support people in the waiting area - these visitors may rotate.   Children under the age of 52 must have an adult with them who is not the patient. If the patient needs to stay at the hospital during part of their recovery, the visitor guidelines for inpatient rooms apply. Pre-op nurse will coordinate an appropriate time for 1 support person to accompany patient in pre-op.  This support person may not rotate.   Please refer to the [facility] website for the visitor guidelines for Inpatients (after your surgery is over and you are in a regular room).    Special instructions:   [facility]- Preparing For Surgery  Before surgery, you can play  an important role. Because skin is not sterile, your skin needs to be as free of germs as possible. You can reduce the number of germs on your skin by washing with CHG (chlorahexidine gluconate) Soap before surgery.  CHG is an antiseptic cleaner which kills germs and bonds with the skin to continue killing germs even after washing.    Oral Hygiene is also important to reduce your risk of infection.  Remember - BRUSH YOUR TEETH THE MORNING OF SURGERY WITH YOUR REGULAR TOOTHPASTE  Please do not use if you have an allergy to CHG or antibacterial soaps. If your skin becomes reddened/irritated stop using  the CHG.  Do not shave (including legs and underarms) for at least 48 hours prior to first CHG shower. It is OK to shave your face.  Please follow these instructions carefully.   Shower the NIGHT BEFORE SURGERY and the MORNING OF SURGERY  If you chose to wash your hair, wash your hair first as usual with your normal shampoo.  After you shampoo, rinse your hair and body thoroughly to remove the shampoo.  Use CHG Soap as you would any other liquid soap. You can apply CHG directly to the skin and wash gently with a scrungie or a clean washcloth.   Apply the CHG Soap to your body ONLY FROM THE NECK DOWN.  Do not use on open wounds or open sores. Avoid contact with your eyes, ears, mouth and genitals (private parts). Wash Face and genitals (private parts)  with your normal soap.   Wash thoroughly, paying special attention to the area where your surgery will be performed.  Thoroughly rinse your body with warm water from the neck down.  DO NOT shower/wash with your normal soap after using and rinsing off the CHG Soap.  Pat yourself dry with a CLEAN TOWEL.  Wear CLEAN PAJAMAS to bed the night before surgery  Place CLEAN SHEETS on your bed the night before your surgery  DO NOT SLEEP WITH PETS.   Day of Surgery: Take a shower with CHG soap. Do not wear jewelry or makeup Do not wear lotions, powders, perfumes/colognes, or deodorant. Do not shave 48 hours prior to surgery.  Men may shave face and neck. Do not bring valuables to the hospital.  [facility] is not responsible for any belongings or valuables. Do not wear nail polish, gel polish, artificial nails, or any other type of covering on natural nails (fingers and toes) If you have artificial nails or gel coating that need to be removed by a nail salon, please have this removed prior to surgery. Artificial nails or gel coating may interfere with anesthesia's ability to adequately monitor your vital signs.  Wear Clean/Comfortable  clothing the morning of surgery Remember to brush your teeth WITH YOUR REGULAR TOOTHPASTE.   Please read over the following fact sheets that you were given.    If you received a COVID test during your pre-op visit  it is requested that you wear a mask when out in public, stay away from anyone that may not be feeling well and notify your surgeon if you develop symptoms. If you have been in contact with anyone that has tested positive in the last 10 days please notify you surgeon.

## 2022-09-07 ENCOUNTER — Other Ambulatory Visit: Payer: Self-pay | Admitting: Physician Assistant

## 2022-09-07 ENCOUNTER — Other Ambulatory Visit: Payer: Self-pay

## 2022-09-07 ENCOUNTER — Encounter (HOSPITAL_COMMUNITY)
Admission: RE | Admit: 2022-09-07 | Discharge: 2022-09-07 | Disposition: A | Discharge status: Home / Self Care | Payer: Medicare Other | Source: Ambulatory Visit | Attending: Orthopaedic Surgery | Admitting: Orthopaedic Surgery

## 2022-09-07 ENCOUNTER — Encounter (HOSPITAL_COMMUNITY): Payer: Self-pay

## 2022-09-07 VITALS — BP 102/57 | HR 58 | Temp 97.9°F | Resp 18 | Ht <= 58 in | Wt 191.0 lb

## 2022-09-07 DIAGNOSIS — G4733 Obstructive sleep apnea (adult) (pediatric): Secondary | ICD-10-CM | POA: Insufficient documentation

## 2022-09-07 DIAGNOSIS — K219 Gastro-esophageal reflux disease without esophagitis: Secondary | ICD-10-CM | POA: Diagnosis not present

## 2022-09-07 DIAGNOSIS — I2510 Atherosclerotic heart disease of native coronary artery without angina pectoris: Secondary | ICD-10-CM | POA: Diagnosis not present

## 2022-09-07 DIAGNOSIS — J4540 Moderate persistent asthma, uncomplicated: Secondary | ICD-10-CM | POA: Insufficient documentation

## 2022-09-07 DIAGNOSIS — M160 Bilateral primary osteoarthritis of hip: Secondary | ICD-10-CM | POA: Diagnosis not present

## 2022-09-07 DIAGNOSIS — E785 Hyperlipidemia, unspecified: Secondary | ICD-10-CM | POA: Diagnosis not present

## 2022-09-07 DIAGNOSIS — Z8774 Personal history of (corrected) congenital malformations of heart and circulatory system: Secondary | ICD-10-CM | POA: Diagnosis not present

## 2022-09-07 DIAGNOSIS — I10 Essential (primary) hypertension: Secondary | ICD-10-CM | POA: Insufficient documentation

## 2022-09-07 DIAGNOSIS — Z01818 Encounter for other preprocedural examination: Secondary | ICD-10-CM

## 2022-09-07 DIAGNOSIS — Z7901 Long term (current) use of anticoagulants: Secondary | ICD-10-CM | POA: Diagnosis not present

## 2022-09-07 DIAGNOSIS — R7303 Prediabetes: Secondary | ICD-10-CM | POA: Diagnosis not present

## 2022-09-07 DIAGNOSIS — M1612 Unilateral primary osteoarthritis, left hip: Secondary | ICD-10-CM

## 2022-09-07 HISTORY — DX: Pneumonia, unspecified organism: J18.9

## 2022-09-07 LAB — CBC
HCT: 36.6 % (ref 36.0–46.0)
Hemoglobin: 11.8 g/dL — ABNORMAL LOW (ref 12.0–15.0)
MCH: 32.2 pg (ref 26.0–34.0)
MCHC: 32.2 g/dL (ref 30.0–36.0)
MCV: 100 fL (ref 80.0–100.0)
Platelets: 199 10*3/uL (ref 150–400)
RBC: 3.66 MIL/uL — ABNORMAL LOW (ref 3.87–5.11)
RDW: 15.7 % — ABNORMAL HIGH (ref 11.5–15.5)
WBC: 4.1 10*3/uL (ref 4.0–10.5)
nRBC: 0 % (ref 0.0–0.2)

## 2022-09-07 LAB — BASIC METABOLIC PANEL
Anion gap: 8 (ref 5–15)
BUN: 24 mg/dL — ABNORMAL HIGH (ref 8–23)
CO2: 21 mmol/L — ABNORMAL LOW (ref 22–32)
Calcium: 9.2 mg/dL (ref 8.9–10.3)
Chloride: 112 mmol/L — ABNORMAL HIGH (ref 98–111)
Creatinine, Ser: 1.08 mg/dL — ABNORMAL HIGH (ref 0.44–1.00)
GFR, Estimated: 54 mL/min — ABNORMAL LOW (ref >60)
Glucose, Bld: 99 mg/dL (ref 70–99)
Potassium: 3.6 mmol/L (ref 3.5–5.1)
Sodium: 141 mmol/L (ref 135–145)

## 2022-09-07 LAB — SURGICAL PCR SCREEN
MRSA, PCR: NEGATIVE
Staphylococcus aureus: NEGATIVE

## 2022-09-07 NOTE — Progress Notes (Signed)
PCP - Dr. Gilford Rile Cardiologist - Dr. Jenne Campus  PPM/ICD - Denies Device Orders - n/a Rep Notified - n/a  Chest x-ray - n/a  EKG - 03/08/2022 - Tracing Requested Stress Test - 05/21/2020 ECHO - 08/12/2022 Cardiac Cath - 02/15/2022  Sleep Study - Study many years ago positive for OSA CPAP - Wears CPAP nightly. Pt unaware of settings  No DM  Last dose of GLP1 agonist- n/a GLP1 instructions: n/a  Blood Thinner Instructions: Per MD instructions, pt will hold Eliquis 3 days prior to surgery. Last dose will be 09/10/2022 Aspirin Instructions: RN instructed pt to reach out to Dr. Trevor Mace office for recommendations on when to stop ASA and then confirm those recommendations with Cardiology. Pt stated that she will just go ahead and stop medication today without calling MDs. I encouraged her to still call to verify stopping ASA. She understood instructions. Last dose of ASA 09/07/2022.  ERAS Protcol - Yes. Clear liquids until 0900 morning of surgery PRE-SURGERY Ensure or G2- Yes. Ensure given to patient at PAT appointment.  COVID TEST- n/a   Anesthesia review: Yes. Cardiac Clearance note 08/18/2022. 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 Pt had defect repaired at [facility] April 2023.    Patient denies shortness of breath, fever, cough and chest pain at PAT appointment   All instructions explained to the patient, with a verbal understanding of the material. Patient agrees to go over the instructions while at home for a better understanding. Patient also instructed to self quarantine after being tested for COVID-19. The opportunity to ask questions was provided.

## 2022-09-08 NOTE — Anesthesia Preprocedure Evaluation (Addendum)
Anesthesia Evaluation  Patient identified by MRN, date of birth, ID band Patient awake    Reviewed: Allergy & Precautions, NPO status , Patient's Chart, lab work & pertinent test results  Airway Mallampati: II  TM Distance: >3 FB Neck ROM: Full    Dental   Pulmonary asthma , sleep apnea (bipap)    breath sounds clear to auscultation       Cardiovascular hypertension, Pt. on medications and Pt. on home beta blockers + dysrhythmias (eliquis) Atrial Fibrillation + Valvular Problems/Murmurs (mild MR) MR  Rhythm:Regular Rate:Normal  hx of ASD s/p repair 01/2022 at [facility] (noted to have episode of afib following surgery)   EKG 03/08/22 (care everywhere, tracing requested): NSR. Rate 74. IVCD. Late precordial R/S transition. Left ventricular hypertrophy. Prolonged QT (Qtc 489). Artifact Present    TTE 08/12/22:  1. Left ventricular ejection fraction, by estimation, is 55 to 60%. The  left ventricle has normal function. The left ventricle has no regional  wall motion abnormalities. There is mild concentric left ventricular  hypertrophy. Left ventricular diastolic  parameters are consistent with Grade I diastolic dysfunction (impaired  relaxation). The average left ventricular global longitudinal strain is  -17.7 %.   2. Right ventricular systolic function is normal. The right ventricular  size is normal.   3. The mitral valve is degenerative. Mild mitral valve regurgitation. No  evidence of mitral stenosis.   4. The aortic valve is tricuspid. Aortic valve regurgitation is not  visualized. Aortic valve sclerosis is present, with no evidence of aortic  valve stenosis.   5. Aortic Normal DTA. There is borderline dilatation of the ascending  aorta, measuring 39 mm.   6. The inferior vena cava is normal in size with greater than 50%  respiratory variability, suggesting right atrial pressure of 3 mmHg.    Cath 02/15/22 (pre ASD  repair): Impressions:   Technically challenging case requiring switch to femoral approach and even then multiple catheters to engage LM. There is diffuse non-obstructive CAD with disease in mid LCx compromising flow to OM3, but would probably favor medical therapy of this.  Shunt ratio1.8:1 consistent with significant ASD.  Low filling pressures and normal CO/CI.       Neuro/Psych negative neurological ROS  negative psych ROS   GI/Hepatic Neg liver ROS,GERD  ,,  Endo/Other    Morbid obesityBMI 43  Renal/GU Renal InsufficiencyRenal diseaseCr 1.08  negative genitourinary   Musculoskeletal  (+) Arthritis , Osteoarthritis,    Abdominal  (+) + obese  Peds  Hematology  (+) Blood dyscrasia, anemia Hb 11.8, plt 199   Anesthesia Other Findings   Reproductive/Obstetrics negative OB ROS                              Lab Results  Component Value Date   WBC 4.1 09/07/2022   HGB 11.8 (L) 09/07/2022   HCT 36.6 09/07/2022   MCV 100.0 09/07/2022   PLT 199 09/07/2022   Lab Results  Component Value Date   CREATININE 1.08 (H) 09/07/2022   BUN 24 (H) 09/07/2022   NA 141 09/07/2022   K 3.6 09/07/2022   CL 112 (H) 09/07/2022   CO2 21 (L) 09/07/2022    Anesthesia Physical Anesthesia Plan  ASA: 3  Anesthesia Plan:    Post-op Pain Management:    Induction:   PONV Risk Score and Plan:   Airway Management Planned:   Additional Equipment:   Intra-op Plan:  Post-operative Plan:   Informed Consent:   Plan Discussed with:   Anesthesia Plan Comments: (PAT note by Antionette Poles, PA-C: Patient follows with cardiology for hx of ASD s/p repair 01/2022 at [facility] (noted to have episode of afib following surgery), HTN, nonobstructive CAD, ascending aortic aneurysm 28mm. Cardiac clearance per telephone encounter by Gillian Shields, NP 08/25/22 states, "Chart reviewed as part of pre-operative protocol coverage. Given past medical history and time since last  visit, based on ACC/AHA guidelines, Margarete C Eichinger is at acceptable risk for the planned procedure without further cardiovascular testing. Last seen 08/02/22 by Dr. Bing Matter doing well from cardiac perspective recommended to follow up in 6 months. Per pharmacy team and office protocol, may hold Eliquis 3 days prior to planned procedure."  Hx of OSA on CPAP  Hx of moderate persistent asthma, maintained on symbicort.  Chronic medical conditions followed by PCP Adela Lank, NP. Last seen 08/03/22 and stable at that time. Per note, "All of her healthcare issues are largely stable heart disease is stable she is following with cardiology for history of thoracic aortic aneurysm as well as postop A-fib she is on Eliquis because he feels strongly she probably has paroxysmal A-fib, hypertension is stable she has moderate persistent asthma which is stable OSA which is stable, GERD is stable, osteoarthritis severe osteoarthritis of the hip that needs to be worked on. Dyslipidemia and prediabetes labs need to be updated. Restless legs are largely stable for her she is also taking vitamin B12 on a daily basis and GERD on a daily basis is managed by Protonix."  Preop albs reviewed, mild anemia with Hgb 11.8, otherwise unremarkable.   EKG 03/08/22 (care everywhere, tracing requested): NSR. Rate 74. IVCD. Late precordial R/S transition. Left ventricular hypertrophy. Prolonged QT (Qtc 489). Artifact Present   TTE 08/12/22:  1. Left ventricular ejection fraction, by estimation, is 55 to 60%. The  left ventricle has normal function. The left ventricle has no regional  wall motion abnormalities. There is mild concentric left ventricular  hypertrophy. Left ventricular diastolic  parameters are consistent with Grade I diastolic dysfunction (impaired  relaxation). The average left ventricular global longitudinal strain is  -17.7 %.   2. Right ventricular systolic function is normal. The right ventricular   size is normal.   3. The mitral valve is degenerative. Mild mitral valve regurgitation. No  evidence of mitral stenosis.   4. The aortic valve is tricuspid. Aortic valve regurgitation is not  visualized. Aortic valve sclerosis is present, with no evidence of aortic  valve stenosis.   5. Aortic Normal DTA. There is borderline dilatation of the ascending  aorta, measuring 39 mm.   6. The inferior vena cava is normal in size with greater than 50%  respiratory variability, suggesting right atrial pressure of 3 mmHg.   Cath 02/15/22 (pre ASD repair): Impressions:   Technically challenging case requiring switch to femoral approach and even then multiple catheters to engage LM. There is diffuse non-obstructive CAD with disease in mid LCx compromising flow to OM3, but would probably favor medical therapy of this.  Shunt ratio1.8:1 consistent with significant ASD.  Low filling pressures and normal CO/CI.    Recommendations:  ASD repair.   )         Anesthesia Quick Evaluation

## 2022-09-08 NOTE — Progress Notes (Signed)
Anesthesia Chart Review:  Patient follows with cardiology for hx of ASD s/p repair 01/2022 at [facility] (noted to have episode of afib following surgery), HTN, nonobstructive CAD, ascending aortic aneurysm 54mm. Cardiac clearance per telephone encounter by Gillian Shields, NP 08/25/22 states, "Chart reviewed as part of pre-operative protocol coverage. Given past medical history and time since last visit, based on ACC/AHA guidelines, Alyssa Rosales is at acceptable risk for the planned procedure without further cardiovascular testing. Last seen 08/02/22 by Dr. Bing Matter doing well from cardiac perspective recommended to follow up in 6 months. Per pharmacy team and office protocol, may hold Eliquis 3 days prior to planned procedure."  Hx of OSA on CPAP  Hx of moderate persistent asthma, maintained on symbicort.  Chronic medical conditions followed by PCP Adela Lank, NP. Last seen 08/03/22 and stable at that time. Per note, "All of her healthcare issues are largely stable heart disease is stable she is following with cardiology for history of thoracic aortic aneurysm as well as postop A-fib she is on Eliquis because he feels strongly she probably has paroxysmal A-fib, hypertension is stable she has moderate persistent asthma which is stable OSA which is stable, GERD is stable, osteoarthritis severe osteoarthritis of the hip that needs to be worked on. Dyslipidemia and prediabetes labs need to be updated. Restless legs are largely stable for her she is also taking vitamin B12 on a daily basis and GERD on a daily basis is managed by Protonix."  Preop albs reviewed, mild anemia with Hgb 11.8, otherwise unremarkable.   EKG 03/08/22 (care everywhere, tracing requested): NSR. Rate 74. IVCD. Late precordial R/S transition. Left ventricular hypertrophy. Prolonged QT (Qtc 489). Artifact Present   TTE 08/12/22:  1. Left ventricular ejection fraction, by estimation, is 55 to 60%. The  left ventricle has normal  function. The left ventricle has no regional  wall motion abnormalities. There is mild concentric left ventricular  hypertrophy. Left ventricular diastolic  parameters are consistent with Grade I diastolic dysfunction (impaired  relaxation). The average left ventricular global longitudinal strain is  -17.7 %.   2. Right ventricular systolic function is normal. The right ventricular  size is normal.   3. The mitral valve is degenerative. Mild mitral valve regurgitation. No  evidence of mitral stenosis.   4. The aortic valve is tricuspid. Aortic valve regurgitation is not  visualized. Aortic valve sclerosis is present, with no evidence of aortic  valve stenosis.   5. Aortic Normal DTA. There is borderline dilatation of the ascending  aorta, measuring 39 mm.   6. The inferior vena cava is normal in size with greater than 50%  respiratory variability, suggesting right atrial pressure of 3 mmHg.   Cath 02/15/22 (pre ASD repair): Impressions:   Technically challenging case requiring switch to femoral approach and even then multiple catheters to engage LM. There is diffuse non-obstructive CAD with disease in mid LCx compromising flow to OM3, but would probably favor medical therapy of this.  Shunt ratio1.8:1 consistent with significant ASD.  Low filling pressures and normal CO/CI.    Recommendations:  ASD repair.    Zannie Cove [facility]/Anesthesiology Phone [phone] 09/08/2022 1:36 PM

## 2022-09-14 ENCOUNTER — Observation Stay (HOSPITAL_COMMUNITY): Payer: Medicare Other

## 2022-09-14 ENCOUNTER — Observation Stay (HOSPITAL_COMMUNITY)
Admission: RE | Admit: 2022-09-14 | Discharge: 2022-09-15 | Disposition: A | Discharge status: Home / Self Care | Payer: Medicare Other | Attending: Orthopaedic Surgery | Admitting: Orthopaedic Surgery

## 2022-09-14 ENCOUNTER — Encounter (HOSPITAL_COMMUNITY): Payer: Self-pay | Admitting: Orthopaedic Surgery

## 2022-09-14 ENCOUNTER — Ambulatory Visit (HOSPITAL_BASED_OUTPATIENT_CLINIC_OR_DEPARTMENT_OTHER): Payer: Medicare Other | Admitting: Anesthesiology

## 2022-09-14 ENCOUNTER — Other Ambulatory Visit: Payer: Self-pay

## 2022-09-14 ENCOUNTER — Ambulatory Visit (HOSPITAL_COMMUNITY): Payer: Medicare Other | Admitting: Physician Assistant

## 2022-09-14 ENCOUNTER — Ambulatory Visit (HOSPITAL_COMMUNITY): Payer: Medicare Other

## 2022-09-14 ENCOUNTER — Encounter (HOSPITAL_COMMUNITY)
Admission: RE | Disposition: A | Discharge status: Home / Self Care | Payer: Self-pay | Source: Home / Self Care | Attending: Orthopaedic Surgery

## 2022-09-14 DIAGNOSIS — I4891 Unspecified atrial fibrillation: Secondary | ICD-10-CM | POA: Insufficient documentation

## 2022-09-14 DIAGNOSIS — I340 Nonrheumatic mitral (valve) insufficiency: Secondary | ICD-10-CM | POA: Diagnosis not present

## 2022-09-14 DIAGNOSIS — J4540 Moderate persistent asthma, uncomplicated: Secondary | ICD-10-CM | POA: Diagnosis not present

## 2022-09-14 DIAGNOSIS — M1612 Unilateral primary osteoarthritis, left hip: Principal | ICD-10-CM | POA: Diagnosis present

## 2022-09-14 DIAGNOSIS — I10 Essential (primary) hypertension: Secondary | ICD-10-CM | POA: Diagnosis not present

## 2022-09-14 DIAGNOSIS — I2510 Atherosclerotic heart disease of native coronary artery without angina pectoris: Secondary | ICD-10-CM | POA: Diagnosis not present

## 2022-09-14 DIAGNOSIS — Z96642 Presence of left artificial hip joint: Secondary | ICD-10-CM

## 2022-09-14 HISTORY — DX: Presence of left artificial hip joint: Z96.642

## 2022-09-14 HISTORY — PX: TOTAL HIP ARTHROPLASTY: SHX124

## 2022-09-14 LAB — TYPE AND SCREEN
ABO/RH(D): O POS
Antibody Screen: NEGATIVE

## 2022-09-14 SURGERY — ARTHROPLASTY, HIP, TOTAL, ANTERIOR APPROACH
Anesthesia: General | Site: Hip | Laterality: Left

## 2022-09-14 MED ORDER — FLUTICASONE PROPIONATE 50 MCG/ACT NA SUSP
1.0000 | Freq: Every day | NASAL | Status: DC | PRN
  Filled 2022-09-14: qty 16

## 2022-09-14 MED ORDER — VITAMIN B-12 1000 MCG PO TABS
2500.0000 ug | ORAL_TABLET | Freq: Every day | ORAL | Status: DC
  Administered 2022-09-15: 2500 ug via ORAL

## 2022-09-14 MED ORDER — ONDANSETRON HCL 4 MG/2ML IJ SOLN: 4.0000 mg | Freq: Once | INTRAMUSCULAR | Status: DC | PRN

## 2022-09-14 MED ORDER — METOPROLOL SUCCINATE ER 25 MG PO TB24
25.0000 mg | ORAL_TABLET | Freq: Every day | ORAL | Status: DC
  Administered 2022-09-14: 25 mg via ORAL
  Filled 2022-09-14: qty 1

## 2022-09-14 MED ORDER — HYDROMORPHONE HCL 1 MG/ML IJ SOLN
INTRAMUSCULAR | Status: AC
  Filled 2022-09-14: qty 1

## 2022-09-14 MED ORDER — ALBUTEROL SULFATE (2.5 MG/3ML) 0.083% IN NEBU: 2.5000 mg | INHALATION_SOLUTION | Freq: Four times a day (QID) | RESPIRATORY_TRACT | Status: DC | PRN

## 2022-09-14 MED ORDER — ROCURONIUM BROMIDE 10 MG/ML (PF) SYRINGE
PREFILLED_SYRINGE | INTRAVENOUS | Status: DC | PRN
  Administered 2022-09-14: 40 mg via INTRAVENOUS
  Administered 2022-09-14: 20 mg via INTRAVENOUS

## 2022-09-14 MED ORDER — OXYCODONE HCL 5 MG PO TABS
ORAL_TABLET | ORAL | Status: AC
  Filled 2022-09-14: qty 1

## 2022-09-14 MED ORDER — MENTHOL 3 MG MT LOZG: 1.0000 | LOZENGE | OROMUCOSAL | Status: DC | PRN

## 2022-09-14 MED ORDER — OXYCODONE HCL 5 MG PO TABS
5.0000 mg | ORAL_TABLET | ORAL | Status: DC | PRN
  Administered 2022-09-14: 5 mg via ORAL
  Administered 2022-09-15 (×2): 10 mg via ORAL
  Filled 2022-09-14 (×2): qty 2
  Filled 2022-09-14: qty 1

## 2022-09-14 MED ORDER — CEFAZOLIN SODIUM-DEXTROSE 2-4 GM/100ML-% IV SOLN
2.0000 g | INTRAVENOUS | Status: AC
  Administered 2022-09-14: 2 g via INTRAVENOUS
  Filled 2022-09-14: qty 100

## 2022-09-14 MED ORDER — LACTATED RINGERS IV SOLN: INTRAVENOUS | Status: DC

## 2022-09-14 MED ORDER — ONDANSETRON HCL 4 MG/2ML IJ SOLN
INTRAMUSCULAR | Status: DC | PRN
  Administered 2022-09-14: 4 mg via INTRAVENOUS

## 2022-09-14 MED ORDER — FENTANYL CITRATE (PF) 250 MCG/5ML IJ SOLN
INTRAMUSCULAR | Status: AC
  Filled 2022-09-14: qty 5

## 2022-09-14 MED ORDER — ONDANSETRON HCL 4 MG/2ML IJ SOLN
4.0000 mg | Freq: Four times a day (QID) | INTRAMUSCULAR | Status: DC | PRN
  Administered 2022-09-14: 4 mg via INTRAVENOUS
  Filled 2022-09-14: qty 2

## 2022-09-14 MED ORDER — DEXAMETHASONE SODIUM PHOSPHATE 10 MG/ML IJ SOLN
INTRAMUSCULAR | Status: DC | PRN
  Administered 2022-09-14: 10 mg via INTRAVENOUS

## 2022-09-14 MED ORDER — DOCUSATE SODIUM 100 MG PO CAPS
100.0000 mg | ORAL_CAPSULE | Freq: Two times a day (BID) | ORAL | Status: DC
  Administered 2022-09-14 – 2022-09-15 (×2): 100 mg via ORAL
  Filled 2022-09-14 (×2): qty 1

## 2022-09-14 MED ORDER — METHOCARBAMOL 1000 MG/10ML IJ SOLN
500.0000 mg | Freq: Four times a day (QID) | INTRAVENOUS | Status: DC | PRN
  Filled 2022-09-14: qty 5

## 2022-09-14 MED ORDER — PROPOFOL 10 MG/ML IV BOLUS
INTRAVENOUS | Status: DC | PRN
  Administered 2022-09-14: 120 mg via INTRAVENOUS

## 2022-09-14 MED ORDER — NIACIN 500 MG PO TABS
500.0000 mg | ORAL_TABLET | Freq: Every day | ORAL | Status: DC
  Administered 2022-09-14: 500 mg via ORAL
  Filled 2022-09-14: qty 1

## 2022-09-14 MED ORDER — HYDROMORPHONE HCL 1 MG/ML IJ SOLN: 0.5000 mg | INTRAMUSCULAR | Status: DC | PRN

## 2022-09-14 MED ORDER — ALUM & MAG HYDROXIDE-SIMETH 200-200-20 MG/5ML PO SUSP: 30.0000 mL | ORAL | Status: DC | PRN

## 2022-09-14 MED ORDER — ASPIRIN 81 MG PO TBEC
81.0000 mg | DELAYED_RELEASE_TABLET | Freq: Every day | ORAL | Status: DC
  Administered 2022-09-14 – 2022-09-15 (×2): 81 mg via ORAL
  Filled 2022-09-14 (×2): qty 1

## 2022-09-14 MED ORDER — SODIUM CHLORIDE 0.9 % IR SOLN
Status: DC | PRN
  Administered 2022-09-14: 1000 mL

## 2022-09-14 MED ORDER — DIPHENHYDRAMINE HCL 12.5 MG/5ML PO ELIX: 12.5000 mg | ORAL_SOLUTION | ORAL | Status: DC | PRN

## 2022-09-14 MED ORDER — VITAMIN B-6 100 MG PO TABS
100.0000 mg | ORAL_TABLET | Freq: Every day | ORAL | Status: DC
  Administered 2022-09-15: 100 mg via ORAL
  Filled 2022-09-14: qty 1

## 2022-09-14 MED ORDER — PANTOPRAZOLE SODIUM 40 MG PO TBEC
40.0000 mg | DELAYED_RELEASE_TABLET | Freq: Every day | ORAL | Status: DC
  Administered 2022-09-15: 40 mg via ORAL
  Filled 2022-09-14: qty 1

## 2022-09-14 MED ORDER — PHENOL 1.4 % MT LIQD: 1.0000 | OROMUCOSAL | Status: DC | PRN

## 2022-09-14 MED ORDER — ONDANSETRON HCL 4 MG PO TABS: 4.0000 mg | ORAL_TABLET | Freq: Four times a day (QID) | ORAL | Status: DC | PRN

## 2022-09-14 MED ORDER — CHLORHEXIDINE GLUCONATE 0.12 % MT SOLN
15.0000 mL | Freq: Once | OROMUCOSAL | Status: AC
  Administered 2022-09-14: 15 mL via OROMUCOSAL
  Filled 2022-09-14: qty 15

## 2022-09-14 MED ORDER — AMISULPRIDE (ANTIEMETIC) 5 MG/2ML IV SOLN: 10.0000 mg | Freq: Once | INTRAVENOUS | Status: DC | PRN

## 2022-09-14 MED ORDER — TORSEMIDE 20 MG PO TABS: 20.0000 mg | ORAL_TABLET | Freq: Every day | ORAL | Status: DC

## 2022-09-14 MED ORDER — TRANEXAMIC ACID-NACL 1000-0.7 MG/100ML-% IV SOLN
1000.0000 mg | INTRAVENOUS | Status: AC
  Administered 2022-09-14: 1000 mg via INTRAVENOUS
  Filled 2022-09-14: qty 100

## 2022-09-14 MED ORDER — ORAL CARE MOUTH RINSE: 15.0000 mL | Freq: Once | OROMUCOSAL | Status: AC

## 2022-09-14 MED ORDER — 0.9 % SODIUM CHLORIDE (POUR BTL) OPTIME
TOPICAL | Status: DC | PRN
  Administered 2022-09-14: 1000 mL

## 2022-09-14 MED ORDER — METOCLOPRAMIDE HCL 5 MG PO TABS: 5.0000 mg | ORAL_TABLET | Freq: Three times a day (TID) | ORAL | Status: DC | PRN

## 2022-09-14 MED ORDER — FENTANYL CITRATE (PF) 250 MCG/5ML IJ SOLN
INTRAMUSCULAR | Status: DC | PRN
  Administered 2022-09-14 (×5): 50 ug via INTRAVENOUS

## 2022-09-14 MED ORDER — SODIUM CHLORIDE 0.9 % IV SOLN: INTRAVENOUS | Status: DC

## 2022-09-14 MED ORDER — ACETAMINOPHEN 325 MG PO TABS
325.0000 mg | ORAL_TABLET | Freq: Four times a day (QID) | ORAL | Status: DC | PRN
  Administered 2022-09-15: 650 mg via ORAL
  Filled 2022-09-14: qty 2

## 2022-09-14 MED ORDER — POVIDONE-IODINE 10 % EX SWAB: 2.0000 | Freq: Once | CUTANEOUS | Status: DC

## 2022-09-14 MED ORDER — HYDROMORPHONE HCL 2 MG PO TABS: 2.0000 mg | ORAL_TABLET | ORAL | Status: DC | PRN

## 2022-09-14 MED ORDER — POTASSIUM CHLORIDE CRYS ER 20 MEQ PO TBCR
20.0000 meq | EXTENDED_RELEASE_TABLET | Freq: Every day | ORAL | Status: DC
  Administered 2022-09-15: 20 meq via ORAL
  Filled 2022-09-14: qty 1

## 2022-09-14 MED ORDER — HYDROMORPHONE HCL 1 MG/ML IJ SOLN
0.2500 mg | INTRAMUSCULAR | Status: DC | PRN
  Administered 2022-09-14 (×4): 0.5 mg via INTRAVENOUS

## 2022-09-14 MED ORDER — METOCLOPRAMIDE HCL 5 MG/ML IJ SOLN: 5.0000 mg | Freq: Three times a day (TID) | INTRAMUSCULAR | Status: DC | PRN

## 2022-09-14 MED ORDER — NITROGLYCERIN 0.4 MG SL SUBL: 0.4000 mg | SUBLINGUAL_TABLET | SUBLINGUAL | Status: DC | PRN

## 2022-09-14 MED ORDER — OXYCODONE HCL 5 MG/5ML PO SOLN: 5.0000 mg | Freq: Once | ORAL | Status: AC | PRN

## 2022-09-14 MED ORDER — ACETAMINOPHEN 500 MG PO TABS
1000.0000 mg | ORAL_TABLET | Freq: Once | ORAL | Status: AC
  Administered 2022-09-14: 1000 mg via ORAL
  Filled 2022-09-14: qty 2

## 2022-09-14 MED ORDER — VANCOMYCIN HCL IN DEXTROSE 1-5 GM/200ML-% IV SOLN
1000.0000 mg | Freq: Two times a day (BID) | INTRAVENOUS | Status: AC
  Administered 2022-09-14: 1000 mg via INTRAVENOUS
  Filled 2022-09-14: qty 200

## 2022-09-14 MED ORDER — APIXABAN 5 MG PO TABS
5.0000 mg | ORAL_TABLET | Freq: Two times a day (BID) | ORAL | Status: DC
  Administered 2022-09-15: 5 mg via ORAL
  Filled 2022-09-14: qty 1

## 2022-09-14 MED ORDER — OXYCODONE HCL 5 MG PO TABS
5.0000 mg | ORAL_TABLET | Freq: Once | ORAL | Status: AC | PRN
  Administered 2022-09-14: 5 mg via ORAL

## 2022-09-14 MED ORDER — MIDAZOLAM HCL 2 MG/2ML IJ SOLN
INTRAMUSCULAR | Status: DC | PRN
  Administered 2022-09-14: 2 mg via INTRAVENOUS

## 2022-09-14 MED ORDER — LIDOCAINE 2% (20 MG/ML) 5 ML SYRINGE
INTRAMUSCULAR | Status: DC | PRN
  Administered 2022-09-14: 40 mg via INTRAVENOUS

## 2022-09-14 MED ORDER — METHOCARBAMOL 500 MG PO TABS
500.0000 mg | ORAL_TABLET | Freq: Four times a day (QID) | ORAL | Status: DC | PRN
  Administered 2022-09-14: 500 mg via ORAL
  Filled 2022-09-14: qty 1

## 2022-09-14 MED ORDER — VITAMIN D 25 MCG (1000 UNIT) PO TABS
5000.0000 [IU] | ORAL_TABLET | Freq: Every day | ORAL | Status: DC
  Filled 2022-09-14: qty 5

## 2022-09-14 MED ORDER — HYDROMORPHONE HCL 1 MG/ML IJ SOLN
0.2500 mg | INTRAMUSCULAR | Status: DC | PRN
  Administered 2022-09-14 (×3): 0.5 mg via INTRAVENOUS

## 2022-09-14 MED ORDER — MIDAZOLAM HCL 2 MG/2ML IJ SOLN
INTRAMUSCULAR | Status: AC
  Filled 2022-09-14: qty 2

## 2022-09-14 MED ORDER — LOSARTAN POTASSIUM 50 MG PO TABS
100.0000 mg | ORAL_TABLET | Freq: Every day | ORAL | Status: DC
  Administered 2022-09-14: 100 mg via ORAL
  Filled 2022-09-14: qty 2

## 2022-09-14 SURGICAL SUPPLY — 55 items
BAG COUNTER SPONGE SURGICOUNT (BAG) ×1 IMPLANT
BENZOIN TINCTURE PRP APPL 2/3 (GAUZE/BANDAGES/DRESSINGS) ×1 IMPLANT
BLADE CLIPPER SURG (BLADE) IMPLANT
BLADE SAW SGTL 18X1.27X75 (BLADE) ×1 IMPLANT
COVER SURGICAL LIGHT HANDLE (MISCELLANEOUS) ×1 IMPLANT
CUP ACET PINNACLE SECTR 48MM (Joint) IMPLANT
DRAPE C-ARM 42X72 X-RAY (DRAPES) ×1 IMPLANT
DRAPE STERI IOBAN 125X83 (DRAPES) ×1 IMPLANT
DRAPE U-SHAPE 47X51 STRL (DRAPES) ×3 IMPLANT
DRSG AQUACEL AG ADV 3.5X10 (GAUZE/BANDAGES/DRESSINGS) ×1 IMPLANT
DRSG XEROFORM 1X8 (GAUZE/BANDAGES/DRESSINGS) IMPLANT
DURAPREP 26ML APPLICATOR (WOUND CARE) ×1 IMPLANT
ELECT BLADE 4.0 EZ CLEAN MEGAD (MISCELLANEOUS) ×1
ELECT BLADE 6.5 EXT (BLADE) IMPLANT
ELECT REM PT RETURN 9FT ADLT (ELECTROSURGICAL) ×1
ELECTRODE BLDE 4.0 EZ CLN MEGD (MISCELLANEOUS) ×1 IMPLANT
ELECTRODE REM PT RTRN 9FT ADLT (ELECTROSURGICAL) ×1 IMPLANT
FACESHIELD WRAPAROUND (MASK) ×2 IMPLANT
FACESHIELD WRAPAROUND OR TEAM (MASK) ×2 IMPLANT
GLOVE BIOGEL PI IND STRL 8 (GLOVE) ×2 IMPLANT
GLOVE ECLIPSE 8.0 STRL XLNG CF (GLOVE) ×1 IMPLANT
GLOVE ORTHO TXT STRL SZ7.5 (GLOVE) ×2 IMPLANT
GOWN STRL REUS W/ TWL LRG LVL3 (GOWN DISPOSABLE) ×2 IMPLANT
GOWN STRL REUS W/ TWL XL LVL3 (GOWN DISPOSABLE) ×2 IMPLANT
GOWN STRL REUS W/TWL LRG LVL3 (GOWN DISPOSABLE) ×2
GOWN STRL REUS W/TWL XL LVL3 (GOWN DISPOSABLE) ×2
HANDPIECE INTERPULSE COAX TIP (DISPOSABLE) ×1
HEAD FEM STD 32X+5 STRL (Hips) IMPLANT
KIT BASIN OR (CUSTOM PROCEDURE TRAY) ×1 IMPLANT
KIT TURNOVER KIT B (KITS) ×1 IMPLANT
MANIFOLD NEPTUNE II (INSTRUMENTS) ×1 IMPLANT
NS IRRIG 1000ML POUR BTL (IV SOLUTION) ×1 IMPLANT
PACK TOTAL JOINT (CUSTOM PROCEDURE TRAY) ×1 IMPLANT
PAD ARMBOARD 7.5X6 YLW CONV (MISCELLANEOUS) ×1 IMPLANT
PINN ALTRX NEUT ID X OD 32X48 IMPLANT
PINNSECTOR W/GRIP ACE CUP 48MM (Joint) ×1 IMPLANT
SET HNDPC FAN SPRY TIP SCT (DISPOSABLE) ×1 IMPLANT
STAPLER VISISTAT 35W (STAPLE) IMPLANT
STEM FEM ACTIS HIGH SZ2 (Stem) IMPLANT
STRIP CLOSURE SKIN 1/2X4 (GAUZE/BANDAGES/DRESSINGS) ×2 IMPLANT
SUT ETHIBOND NAB CT1 #1 30IN (SUTURE) ×1 IMPLANT
SUT ETHILON 3 0 PS 1 (SUTURE) IMPLANT
SUT MNCRL AB 4-0 PS2 18 (SUTURE) IMPLANT
SUT VIC AB 0 CT1 27 (SUTURE) ×1
SUT VIC AB 0 CT1 27XBRD ANBCTR (SUTURE) ×1 IMPLANT
SUT VIC AB 1 CT1 27 (SUTURE) ×1
SUT VIC AB 1 CT1 27XBRD ANBCTR (SUTURE) ×1 IMPLANT
SUT VIC AB 2-0 CT1 27 (SUTURE) ×1
SUT VIC AB 2-0 CT1 TAPERPNT 27 (SUTURE) ×1 IMPLANT
TOWEL GREEN STERILE (TOWEL DISPOSABLE) ×1 IMPLANT
TOWEL GREEN STERILE FF (TOWEL DISPOSABLE) ×1 IMPLANT
TRAY CATH 16FR W/PLASTIC CATH (SET/KITS/TRAYS/PACK) IMPLANT
TRAY FOLEY W/BAG SLVR 16FR (SET/KITS/TRAYS/PACK)
TRAY FOLEY W/BAG SLVR 16FR ST (SET/KITS/TRAYS/PACK) IMPLANT
WATER STERILE IRR 1000ML POUR (IV SOLUTION) ×2 IMPLANT

## 2022-09-14 NOTE — Evaluation (Signed)
Physical Therapy Evaluation Patient Details Name: Alyssa Rosales MRN: [id number] DOB: [birth year] Today's Date: 09/14/2022  History of Present Illness  Patient is a 71 y/o female admitted for L direct anterior THA.  PMH positive for OSA, AAA, CAD, HLD, GERD, HTN , obesity and Atrial septal defect s/p closure.  Clinical Impression  Patient presents with decreased mobility due to pain, decreased strength/ROM L LE and decreased activity tolerance post surgery.  She was able to ambulate to hallway today and needed help for L leg in and out of bed.  She should progress and be able to d/c home with family support and follow up therapies as indicated per MD.  PT will follow up.        Recommendations for follow up therapy are one component of a multi-disciplinary discharge planning process, led by the attending physician.  Recommendations may be updated based on patient status, additional functional criteria and insurance authorization.  Follow Up Recommendations Follow physician's recommendations for discharge plan and follow up therapies      Assistance Recommended at Discharge Intermittent Supervision/Assistance  Patient can return home with the following  A little help with walking and/or transfers;Assist for transportation;Help with stairs or ramp for entrance;A little help with bathing/dressing/bathroom    Equipment Recommendations None recommended by PT  Recommendations for Other Services       Functional Status Assessment Patient has had a recent decline in their functional status and demonstrates the ability to make significant improvements in function in a reasonable and predictable amount of time.     Precautions / Restrictions Precautions Precautions: Fall Restrictions Weight Bearing Restrictions: Yes LLE Weight Bearing: Weight bearing as tolerated      Mobility  Bed Mobility Overal bed mobility: Needs Assistance Bed Mobility: Supine to Sit, Sit to Supine      Supine to sit: Min assist Sit to supine: Min assist   General bed mobility comments: assist for L LE    Transfers Overall transfer level: Needs assistance Equipment used: Rolling walker (2 wheels) Transfers: Sit to/from Stand, Bed to chair/wheelchair/BSC Sit to Stand: Min guard   Step pivot transfers: Min guard       General transfer comment: up from EOB to BSC increased time and cues, pt wanting to be as indep as possible    Ambulation/Gait Ambulation/Gait assistance: Supervision, Min guard Gait Distance (Feet): 45 Feet Assistive device: Rolling walker (2 wheels) Gait Pattern/deviations: Step-to pattern, Trunk flexed, Wide base of support, Decreased stance time - left, Antalgic       General Gait Details: mild antalgia, slow pace with flexed posture throughout  Stairs            Wheelchair Mobility    Modified Rankin (Stroke Patients Only)       Balance Overall balance assessment: Needs assistance   Sitting balance-Leahy Scale: Fair     Standing balance support: Bilateral upper extremity supported Standing balance-Leahy Scale: Poor Standing balance comment: UE support and minguard, pt feeling unsteady after surgery and pain meds                             Pertinent Vitals/Pain Pain Assessment Pain Assessment: Faces Faces Pain Scale: Hurts little more Pain Location: L hip Pain Descriptors / Indicators: Discomfort, Grimacing Pain Intervention(s): Monitored during session, Repositioned    Home Living Family/patient expects to be discharged to:: Private residence Living Arrangements: Spouse/significant other Available Help at Discharge: Family Type of Home:  House Home Access: Stairs to enter Entrance Stairs-Rails: Psychiatric nurse of Steps: 4   Home Layout: One level Home Equipment: Conservation officer, nature (2 wheels);Cane - single point;Shower seat;BSC/3in1;Other (comment) Additional Comments: grabber    Prior Function                  ADLs Comments: usually very active     Hand Dominance        Extremity/Trunk Assessment   Upper Extremity Assessment Upper Extremity Assessment: Overall WFL for tasks assessed    Lower Extremity Assessment Lower Extremity Assessment: LLE deficits/detail LLE Deficits / Details: AAROM grossly WFL, mild stiffness/pain       Communication   Communication: No difficulties  Cognition Arousal/Alertness: Lethargic, Suspect due to medications Behavior During Therapy: Flat affect Overall Cognitive Status: Within Functional Limits for tasks assessed                                          General Comments General comments (skin integrity, edema, etc.): N&V x 2 during session, on BSC but only minimal drops, RN informed needs nausea meds    Exercises Total Joint Exercises Ankle Circles/Pumps: AROM, Both, 10 reps, Supine Quad Sets: AROM, Left, 5 reps, Supine Heel Slides: AAROM, Left, 5 reps, Supine, AROM, Right   Assessment/Plan    PT Assessment Patient needs continued PT services  PT Problem List Decreased activity tolerance;Pain;Decreased knowledge of precautions;Decreased mobility;Decreased knowledge of use of DME;Decreased balance       PT Treatment Interventions DME instruction;Functional mobility training;Balance training;Patient/family education;Stair training;Therapeutic exercise;Therapeutic activities;Gait training    PT Goals (Current goals can be found in the Care Plan section)  Acute Rehab PT Goals Patient Stated Goal: to return to independent PT Goal Formulation: With patient/family Time For Goal Achievement: 09/18/22 Potential to Achieve Goals: Good    Frequency 7X/week     Co-evaluation               AM-PAC PT "6 Clicks" Mobility  Outcome Measure Help needed turning from your back to your side while in a flat bed without using bedrails?: A Little Help needed moving from lying on your back to sitting on the side  of a flat bed without using bedrails?: A Little Help needed moving to and from a bed to a chair (including a wheelchair)?: A Little Help needed standing up from a chair using your arms (e.g., wheelchair or bedside chair)?: A Little Help needed to walk in hospital room?: A Little Help needed climbing 3-5 steps with a railing? : Total 6 Click Score: 16    End of Session Equipment Utilized During Treatment: Gait belt Activity Tolerance: Patient limited by lethargy Patient left: in bed;with call bell/phone within reach;with family/visitor present   PT Visit Diagnosis: Difficulty in walking, not elsewhere classified (R26.2);Pain Pain - Right/Left: Left Pain - part of body: Hip    Time: 1649-1730 PT Time Calculation (min) (ACUTE ONLY): 41 min   Charges:   PT Evaluation $PT Eval Moderate Complexity: 1 Mod PT Treatments $Gait Training: 8-22 mins $Therapeutic Activity: 8-22 mins        Magda Kiel, PT Acute Rehabilitation Services Office:[phone] 09/14/2022   Reginia Naas 09/14/2022, 5:45 PM

## 2022-09-14 NOTE — Anesthesia Procedure Notes (Signed)
Procedure Name: Intubation Date/Time: 09/14/2022 12:53 PM  Performed by: Rosiland Oz, CRNAPre-anesthesia Checklist: Patient identified, Emergency Drugs available, Suction available, Patient being monitored and Timeout performed Patient Re-evaluated:Patient Re-evaluated prior to induction Oxygen Delivery Method: Circle system utilized Preoxygenation: Pre-oxygenation with 100% oxygen Induction Type: IV induction Ventilation: Mask ventilation without difficulty Laryngoscope Size: Miller and 3 Grade View: Grade I Tube type: Oral Tube size: 7.0 mm Number of attempts: 1 Airway Equipment and Method: Stylet Placement Confirmation: ETT inserted through vocal cords under direct vision, positive ETCO2 and breath sounds checked- equal and bilateral Secured at: 21 cm Tube secured with: Tape Dental Injury: Teeth and Oropharynx as per pre-operative assessment

## 2022-09-14 NOTE — H&P (Signed)
TOTAL HIP ADMISSION H&P  Patient is admitted for left total hip arthroplasty.  Subjective:  Chief Complaint: left hip pain  HPI: Alyssa Rosales, 71 y.o. female, has a history of pain and functional disability in the left hip(s) due to arthritis and patient has failed non-surgical conservative treatments for greater than 12 weeks to include NSAID's and/or analgesics, corticosteriod injections, flexibility and strengthening excercises, use of assistive devices, weight reduction as appropriate, and activity modification.  Onset of symptoms was gradual starting 4 years ago with gradually worsening course since that time.The patient noted no past surgery on the left hip(s).  Patient currently rates pain in the left hip at 10 out of 10 with activity. Patient has night pain, worsening of pain with activity and weight bearing, trendelenberg gait, pain that interfers with activities of daily living, and pain with passive range of motion. Patient has evidence of subchondral cysts, subchondral sclerosis, periarticular osteophytes, and joint space narrowing by imaging studies. This condition presents safety issues increasing the risk of falls.  There is no current active infection.  Patient Active Problem List   Diagnosis Date Noted   Thrombocytopenia (HCC) 03/15/2022   Postoperative atrial fibrillation (HCC) 02/20/2022   Acute postoperative pain 02/16/2022   Postoperative anemia due to acute blood loss 02/16/2022   S/P atrial septal defect closure, surgical 02/16/2022   Atrial septal defect 09/14/2021   Preop cardiovascular exam 09/10/2021   Unilateral primary osteoarthritis, left hip 04/06/2021   Morbid obesity (HCC) 06/17/2020   Atrophic vaginitis 06/08/2019   Overactive thyroid gland 04/19/2019   Abdominal aortic aneurysm (AAA) without rupture (HCC) 09/01/2017   History of thyroid nodule 04/21/2017   Coronary artery disease involving native coronary artery of native heart without angina  pectoris 02/09/2017   Thoracic aortic aneurysm without rupture (HCC) 02/09/2017   Hyperlipidemia 01/27/2017   OSA (obstructive sleep apnea) 09/30/2016   Moderate persistent asthma without complication 08/10/2016   Restrictive lung disease 08/10/2016   Lumbar pain with radiation down both legs 06/01/2016   Bilateral hip pain 06/01/2016   Dyslipidemia 02/16/2016   Dyspnea on exertion 02/16/2016   Precordial pain 02/16/2016   GERD without esophagitis 01/07/2016   Essential hypertension 01/07/2016   Morbid obesity with BMI of 45.0-49.9, adult (HCC) 01/07/2016   Malaise and fatigue 01/07/2016   Abnormal glucose 01/07/2016   Primary osteoarthritis involving multiple joints 01/07/2016   Past Medical History:  Diagnosis Date   Abdominal aortic aneurysm (AAA) without rupture (HCC) 09/01/2017   Arthritis    Coronary artery disease involving native coronary artery of native heart without angina pectoris 02/09/2017   Edema, lower extremity    GERD without esophagitis 01/07/2016   Last Assessment & Plan:  Continue with the PPI and have refilled this for her   History of kidney problems    History of kidney stones    Hyperlipidemia 01/27/2017   Overview:  Added automatically from request for surgery [id number]   Hypertension    Joint pain    Kidney stone    Moderate persistent asthma without complication 08/10/2016   OSA (obstructive sleep apnea) 09/30/2016   Follows with pulmonary is seen 12/19 for FU bipap 21/15   Pneumonia    many years ago   Vitamin D deficiency     Past Surgical History:  Procedure Laterality Date   AMPUTATION Right 10/21/2020   Procedure: AMPUTATION RIGHT MIDDLE FINGER;  Surgeon: Cindee Salt, MD;  Location: [facility];  Service: Orthopedics;  Laterality: Right;  IV  REGIONAL FOREARM BLOCK, BIER BLOCK   ATRIAL SEPTAL DEFECT(ASD) CLOSURE  2023   COLONOSCOPY     CORONARY ANGIOPLASTY     INCISION AND DRAINAGE Right 11/14/2020   Procedure: INCISION AND  DRAINAGE RIGHT MIDDLE FINGER;  Surgeon: Cindee Salt, MD;  Location: [facility];  Service: Orthopedics;  Laterality: Right;   kidney stone removal     RECTAL SURGERY     x2. both as a child   spur removal     TEE WITHOUT CARDIOVERSION N/A 11/26/2021   Procedure: TRANSESOPHAGEAL ECHOCARDIOGRAM (TEE);  Surgeon: Lewayne Bunting, MD;  Location: [facility];  Service: Cardiovascular;  Laterality: N/A;   TOOTH EXTRACTION     TUBAL LIGATION  1984    Current Facility-Administered Medications  Medication Dose Route Frequency Provider Last Rate Last Admin   ceFAZolin (ANCEF) IVPB 2g/100 mL premix  2 g Intravenous On Call to OR Kirtland Bouchard, PA-C       lactated ringers infusion   Intravenous Continuous Val Eagle, MD 10 mL/hr at 09/14/22 1013 New Bag at 09/14/22 1013   povidone-iodine 10 % swab 2 Application  2 Application Topical Once Kirtland Bouchard, PA-C       tranexamic acid (CYKLOKAPRON) IVPB 1,000 mg  1,000 mg Intravenous To OR Kirtland Bouchard, PA-C       Allergies  Allergen Reactions   Amoxicillin-Pot Clavulanate Diarrhea and Nausea And Vomiting   Penicillins Diarrhea   Propoxyphene Rash and Other (See Comments)    violent  Violent 01/25/22 Per [facility] allergy list brought with patient, patient unsure of exact reaction   Sulfa Antibiotics Rash    01/25/22 Patient unsure of exact reaction   Sulfamethoxazole Rash    Social History   Tobacco Use   Smoking status: Never   Smokeless tobacco: Never  Substance Use Topics   Alcohol use: Never    Family History  Problem Relation Age of Onset   Alzheimer's disease Mother    Hyperlipidemia Mother    Hypertension Mother    Diabetes Mother    Hypertension Father    Hyperlipidemia Father    Breast cancer Neg Hx      Review of Systems  Objective:  Physical Exam Vitals reviewed.  Constitutional:      Appearance: Normal appearance. She is obese.  HENT:     Head: Normocephalic and  atraumatic.  Eyes:     Extraocular Movements: Extraocular movements intact.     Pupils: Pupils are equal, round, and reactive to light.  Cardiovascular:     Rate and Rhythm: Normal rate.     Pulses: Normal pulses.  Pulmonary:     Effort: Pulmonary effort is normal.     Breath sounds: Normal breath sounds.  Abdominal:     Palpations: Abdomen is soft.  Musculoskeletal:     Cervical back: Normal range of motion and neck supple.     Left hip: Tenderness, bony tenderness and crepitus present. Decreased range of motion. Decreased strength.  Neurological:     Mental Status: She is alert and oriented to person, place, and time.  Psychiatric:        Behavior: Behavior normal.     Vital signs in last 24 hours: Temp:  [98.1 F (36.7 C)] 98.1 F (36.7 C) (11/14 0949) Pulse Rate:  [65] 65 (11/14 0949) Resp:  [18] 18 (11/14 0949) BP: (120)/(73) 120/73 (11/14 0949) SpO2:  [96 %] 96 % (11/14 0949) Weight:  [86.6 kg] 86.6 kg (  11/14 1013)  Labs:   Estimated body mass index is 42.82 kg/m as calculated from the following:   Height as of this encounter: 4\' 8"  (1.422 m).   Weight as of this encounter: 86.6 kg.   Imaging Review Plain radiographs demonstrate severe degenerative joint disease of the left hip(s). The bone quality appears to be good for age and reported activity level.      Assessment/Plan:  End stage arthritis, left hip(s)  The patient history, physical examination, clinical judgement of the provider and imaging studies are consistent with end stage degenerative joint disease of the left hip(s) and total hip arthroplasty is deemed medically necessary. The treatment options including medical management, injection therapy, arthroscopy and arthroplasty were discussed at length. The risks and benefits of total hip arthroplasty were presented and reviewed. The risks due to aseptic loosening, infection, stiffness, dislocation/subluxation,  thromboembolic complications and other  imponderables were discussed.  The patient acknowledged the explanation, agreed to proceed with the plan and consent was signed. Patient is being admitted for inpatient treatment for surgery, pain control, PT, OT, prophylactic antibiotics, VTE prophylaxis, progressive ambulation and ADL's and discharge planning.The patient is planning to be discharged home with home health services

## 2022-09-14 NOTE — Transfer of Care (Signed)
Immediate Anesthesia Transfer of Care Note  Patient: Alyssa Rosales  Procedure(s) Performed: LEFT TOTAL HIP ARTHROPLASTY ANTERIOR APPROACH (Left: Hip)  Patient Location: PACU  Anesthesia Type:General  Level of Consciousness: drowsy and patient cooperative  Airway & Oxygen Therapy: Patient Spontanous Breathing  Post-op Assessment: Report given to RN and Post -op Vital signs reviewed and stable  Post vital signs: Reviewed and stable  Last Vitals:  Vitals Value Taken Time  BP 176/88 09/14/22 1432  Temp    Pulse 75 09/14/22 1433  Resp 15 09/14/22 1433  SpO2 96 % 09/14/22 1433  Vitals shown include unvalidated device data.  Last Pain:  Vitals:   09/14/22 1012  PainSc: 10-Worst pain ever      Patients Stated Pain Goal: 5 (09/14/22 1012)  Complications: No notable events documented.

## 2022-09-14 NOTE — Op Note (Signed)
Operative Note  Date of operation: 09/14/2022 Preoperative diagnosis: Left hip severe degenerative joint disease/osteoarthritis Postoperative diagnosis: Same  Procedure: Left direct anterior total hip arthroplasty  Implants: DePuy sector GRIPTION acetabular opponent size 48, size 32+4 polythene liner, size 2 Actis femoral component with high offset, 32+5 metal head ball  Surgeon: Vanita Panda. Magnus Ivan, MD Assistant: Rexene Edison, PA-C  Anesthesia: General Antibiotics: 2 g IV Ancef EBL: 300 cc Complications: None  Indications: The patient is a morbidly obese 71 year old with debilitating arthritis involving her left hip and that has been getting worse over the last 4 years.  Her x-rays show complete loss of the cartilage of her left hip.  Her left lower extremity is shortened as a result of joint space.  She walks with a Trendelenburg gait.  Her left hip pain is 10 out of 10 and is debilitating.  The arthritis is detrimentally affecting her mobility, her quality of life and her actives of daily living.  She has tried to lose weight.  She is at the point where she does wish to proceed with a total hip arthroplasty.  She understands that all of her risk associate with the surgery are significant heightened given her BMI of over 40.  This concluded the significant scar of acute blood loss, nerve vessel injury, fracture, infection, dislocation, DVT, implant failure, leg length differences, and hip specially wound healing issues.  She understands her goals are hopefully decrease pain, improve mobility and improve quality of life.  Procedure description: After informed consent was obtained and the appropriate left hip was marked, the patient was brought to the operating room and set up on her stretcher where spinal anesthesia was attempted.  It was unsuccessful and so she was placed in the supine position on the stretcher and general anesthesia was obtained.  We then assessed her leg length and she  definitely shoulder on the left side comparing the left and right side.  Traction boots were then placed on both her feet and she was placed supine on the Hana fracture table with a perineal post in place in both legs and inline skeletal traction devices but no traction applied.  We then assessed her left and right hip and pelvis radiographically so we could obtain a good starting point.  Her left operative hip was then prepped and draped with DuraPrep and sterile drapes.  A timeout was called and she identified as correct patient the correct left hip.  An incision was then made just inferior and posterior to the ASIS and this was carried slightly obliquely down the leg.  Dissection was carried down to the tensor fascia lata muscle and the tensor fascia was then divided longitudinally to proceed with a directed approach to the hip.  Circumflex vessels were identified and cauterized.  The hip capsule was identified and opened up in an L-type format finding a moderate joint effusion.  Cobra retractors were placed around the medial lateral femoral neck and a femoral neck cut was made just proximal to the lesser trochanter with an oscillating saw and completed with an osteotome.  A corkscrew guide was placed in the femoral head and the femoral head was removed in its entirety.  There was a wide area devoid of cartilage.  There was no evidence of some osteonecrosis as well.  A bent Hohmann was then placed over the medial acetabular rim and remnants of the acetabular labrum and other debris were removed.  We then began reaming under regularization and sequential increments from a  size 43 reamer and stepwise increments going up to a size 47 reamer with all reamers placed under direct visitation in the last reamer was placed under direct fluoroscopy so we could obtain our depth reaming, our inclination and the anteversion.  The real DePuy sector GRIPTION acetabular component size 48 was then placed without difficulty and we  assessed again radiographically.  We went with a 32+0 polythene liner for that size 48 acetabular opponent.  Attention was then turned to the femur.  With the left leg externally rotated to 120 degrees, extended and brought down underneath the other leg, a Mueller retractor was placed medially to the proximal femur and trochanteric area and a Hohmann retractor was placed behind the greater trochanter.  A box cutting osteotome was used to enter femoral canal.  The lateral capsule was released.  Broaching was then initiated from a size 0 broach going up to a size 2 broach.  With a size 2 broach in place we trialed a standard offset femoral neck and a 32+1 trial hip ball.  The leg was brought over and up and with traction and internal rotation reduced in the pelvis.  We assessed it radiographically and clinically and we felt like we needed more offset and leg length.  Of note again she was significantly short prior to surgery with a slight flexion contracture of her left knee.  We dislocated the hip and remove the trial components.  We then went with the real femoral component but a size 2 with high offset.  We went with the real 32+5 metal head ball.  We brought the leg back over and up and with traction internal rotation reduced in the pelvis.  We assessed it radiographically and clinically and we are pleased that restoring her leg length and offset.  The range of motion felt good as well as the stability.  We then irrigated the soft tissue normal saline solution.  Remnants of the joint capsule were closed with interrupted #1 Ethibond suture followed by #1 Vicryl to close the tensor fascia.  0 Vicryl was used to close the deep tissue and 2-0 Vicryl is used to close, skin tissue.  The skin was closed with interrupted 2-0 nylon suture.  Xeroform and well-padded sterile dressing was applied.  She was taken off the Hana table awakened, extubated and taken recovery room in stable addition with all final counts being  correct and no complications noted.  Benita Stabile, PA-C did assist the entire case from beginning to end and his assistance was medically necessary and crucial for retracting and managing soft tissues, helping guide implant placement and a layered closure of the wound.

## 2022-09-15 DIAGNOSIS — M1612 Unilateral primary osteoarthritis, left hip: Secondary | ICD-10-CM | POA: Diagnosis not present

## 2022-09-15 LAB — CBC
HCT: 31.7 % — ABNORMAL LOW (ref 36.0–46.0)
Hemoglobin: 10.6 g/dL — ABNORMAL LOW (ref 12.0–15.0)
MCH: 33.1 pg (ref 26.0–34.0)
MCHC: 33.4 g/dL (ref 30.0–36.0)
MCV: 99.1 fL (ref 80.0–100.0)
Platelets: 185 10*3/uL (ref 150–400)
RBC: 3.2 MIL/uL — ABNORMAL LOW (ref 3.87–5.11)
RDW: 14.6 % (ref 11.5–15.5)
WBC: 11.9 10*3/uL — ABNORMAL HIGH (ref 4.0–10.5)
nRBC: 0 % (ref 0.0–0.2)

## 2022-09-15 LAB — BASIC METABOLIC PANEL
Anion gap: 11 (ref 5–15)
BUN: 20 mg/dL (ref 8–23)
CO2: 23 mmol/L (ref 22–32)
Calcium: 9.3 mg/dL (ref 8.9–10.3)
Chloride: 104 mmol/L (ref 98–111)
Creatinine, Ser: 1 mg/dL (ref 0.44–1.00)
GFR, Estimated: 59 mL/min — ABNORMAL LOW (ref >60)
Glucose, Bld: 126 mg/dL — ABNORMAL HIGH (ref 70–99)
Potassium: 4.7 mmol/L (ref 3.5–5.1)
Sodium: 138 mmol/L (ref 135–145)

## 2022-09-15 MED ORDER — OXYCODONE HCL 5 MG PO TABS: 5.0000 mg | ORAL_TABLET | Freq: Four times a day (QID) | ORAL | 0 refills | Status: DC | PRN

## 2022-09-15 MED ORDER — ACETAMINOPHEN 325 MG PO TABS: 325.0000 mg | ORAL_TABLET | Freq: Four times a day (QID) | ORAL | 0 refills | Status: AC | PRN

## 2022-09-15 MED ORDER — METHOCARBAMOL 500 MG PO TABS: 500.0000 mg | ORAL_TABLET | Freq: Four times a day (QID) | ORAL | 1 refills | Status: DC | PRN

## 2022-09-15 NOTE — Anesthesia Postprocedure Evaluation (Signed)
Anesthesia Post Note  Patient: Alyssa Rosales  Procedure(s) Performed: LEFT TOTAL HIP ARTHROPLASTY ANTERIOR APPROACH (Left: Hip)     Patient location during evaluation: PACU Anesthesia Type: General Level of consciousness: awake and alert Pain management: pain level controlled Vital Signs Assessment: post-procedure vital signs reviewed and stable Respiratory status: spontaneous breathing, nonlabored ventilation, respiratory function stable and patient connected to nasal cannula oxygen Cardiovascular status: blood pressure returned to baseline and stable Postop Assessment: no apparent nausea or vomiting Anesthetic complications: no   No notable events documented.  Last Vitals:  Vitals:   09/15/22 0344 09/15/22 0943  BP: (!) 101/55 93/60  Pulse: 71 67  Resp: 18 16  Temp: 37.1 C 36.6 C  SpO2: 99% 96%    Last Pain:  Vitals:   09/15/22 0344  TempSrc: Oral  PainSc: 7                  Kennieth Rad

## 2022-09-15 NOTE — Plan of Care (Signed)
  Problem: Education: Goal: Knowledge of the prescribed therapeutic regimen will improve Outcome: Completed/Met Goal: Understanding of discharge needs will improve Outcome: Completed/Met Goal: Individualized Educational Video(s) Outcome: Completed/Met   Problem: Activity: Goal: Ability to avoid complications of mobility impairment will improve Outcome: Completed/Met Goal: Ability to tolerate increased activity will improve Outcome: Completed/Met   Problem: Clinical Measurements: Goal: Postoperative complications will be avoided or minimized Outcome: Completed/Met   Problem: Pain Management: Goal: Pain level will decrease with appropriate interventions Outcome: Completed/Met   Problem: Skin Integrity: Goal: Will show signs of wound healing Outcome: Completed/Met   

## 2022-09-15 NOTE — Progress Notes (Signed)
Patient alert and oriented, voiding adequately, skin clean, dry and intact without evidence of skin break down, or symptoms of complications - no redness or edema noted, only slight tenderness at site.  Patient states pain is manageable at time of discharge. Patient has an appointment with MD in 2 weeks 

## 2022-09-15 NOTE — Progress Notes (Signed)
Subjective: 1 Day Post-Op Procedure(s) (LRB): LEFT TOTAL HIP ARTHROPLASTY ANTERIOR APPROACH (Left) Patient reports pain as severe.  Appears comfortable sitting on edge of bed. Progressing well with PT.   Objective: Vital signs in last 24 hours: Temp:  [97.5 F (36.4 C)-98.7 F (37.1 C)] 98.7 F (37.1 C) (11/15 0344) Pulse Rate:  [55-76] 71 (11/15 0344) Resp:  [10-21] 18 (11/15 0344) BP: (101-176)/(55-88) 101/55 (11/15 0344) SpO2:  [90 %-100 %] 99 % (11/15 0344) Weight:  [86.6 kg] 86.6 kg (11/14 1013)  Intake/Output from previous day: 11/14 0701 - 11/15 0700 In: 1780 [P.O.:480; I.V.:1300] Out: 300 [Blood:300] Intake/Output this shift: No intake/output data recorded.  Recent Labs    09/15/22 0618  HGB 10.6*   Recent Labs    09/15/22 0618  WBC 11.9*  RBC 3.20*  HCT 31.7*  PLT 185   Recent Labs    09/15/22 0618  NA 138  K 4.7  CL 104  CO2 23  BUN 20  CREATININE 1.00  GLUCOSE 126*  CALCIUM 9.3   No results for input(s): "LABPT", "INR" in the last 72 hours.  Dorsiflexion/Plantar flexion intact Incision: dressing C/D/I Compartment soft   Assessment/Plan: 1 Day Post-Op Procedure(s) (LRB): LEFT TOTAL HIP ARTHROPLASTY ANTERIOR APPROACH (Left) Up with therapy Possible discharge to home today if remains stable and does well with PT.      Alyssa Rosales 09/15/2022, 8:34 AM

## 2022-09-15 NOTE — Discharge Summary (Signed)
Patient ID: Alyssa Rosales MRN: [id number] DOB/AGE: 1951-03-02 71 y.o.  Admit date: 09/14/2022 Discharge date: 09/15/2022  Admission Diagnoses:  Principal Problem:   Unilateral primary osteoarthritis, left hip Active Problems:   Status post total replacement of left hip   Discharge Diagnoses:  Same  Past Medical History:  Diagnosis Date   Abdominal aortic aneurysm (AAA) without rupture (HCC) 09/01/2017   Arthritis    Coronary artery disease involving native coronary artery of native heart without angina pectoris 02/09/2017   Edema, lower extremity    GERD without esophagitis 01/07/2016   Last Assessment & Plan:  Continue with the PPI and have refilled this for her   History of kidney problems    History of kidney stones    Hyperlipidemia 01/27/2017   Overview:  Added automatically from request for surgery [id number]   Hypertension    Joint pain    Kidney stone    Moderate persistent asthma without complication 08/10/2016   OSA (obstructive sleep apnea) 09/30/2016   Follows with pulmonary is seen 12/19 for FU bipap 21/15   Pneumonia    many years ago   Vitamin D deficiency     Surgeries: Procedure(s): LEFT TOTAL HIP ARTHROPLASTY ANTERIOR APPROACH on 09/14/2022   Consultants:   Discharged Condition: Improved  Hospital Course: Alyssa Rosales is an 71 y.o. female who was admitted 09/14/2022 for operative treatment ofUnilateral primary osteoarthritis, left hip. Patient has severe unremitting pain that affects sleep, daily activities, and work/hobbies. After pre-op clearance the patient was taken to the operating room on 09/14/2022 and underwent  Procedure(s): LEFT TOTAL HIP ARTHROPLASTY ANTERIOR APPROACH.    Patient was given perioperative antibiotics:  Anti-infectives (From admission, onward)    Start     Dose/Rate Route Frequency Ordered Stop   09/14/22 1700  vancomycin (VANCOCIN) IVPB 1000 mg/200 mL premix        1,000 mg 200 mL/hr over 60 Minutes  Intravenous Every 12 hours 09/14/22 1609 09/14/22 1910   09/14/22 0945  ceFAZolin (ANCEF) IVPB 2g/100 mL premix        2 g 200 mL/hr over 30 Minutes Intravenous On call to O.R. 09/14/22 0932 09/14/22 1258        Patient was given sequential compression devices, early ambulation, and chemoprophylaxis to prevent DVT.  Patient benefited maximally from hospital stay and there were no complications.    Recent vital signs: Patient Vitals for the past 24 hrs:  BP Temp Temp src Pulse Resp SpO2 Height Weight  09/15/22 0344 (!) 101/55 98.7 F (37.1 C) Oral 71 18 99 % -- --  09/14/22 2300 116/71 (!) 97.5 F (36.4 C) Oral 68 18 99 % -- --  09/14/22 2003 106/62 97.8 F (36.6 C) Oral (!) 55 18 96 % -- --  09/14/22 1602 (!) 146/74 -- -- 69 16 100 % -- --  09/14/22 1545 122/67 98.2 F (36.8 C) -- 66 15 93 % -- --  09/14/22 1530 113/64 -- -- 68 17 90 % -- --  09/14/22 1515 127/77 -- -- 63 10 94 % -- --  09/14/22 1500 (!) 147/85 -- -- 64 14 95 % -- --  09/14/22 1445 (!) 143/86 -- -- 67 (!) 21 96 % -- --  09/14/22 1430 (!) 176/88 98.2 F (36.8 C) -- 76 16 98 % -- --  09/14/22 1013 -- -- -- -- -- -- -- 86.6 kg  09/14/22 0949 120/73 98.1 F (36.7 C) -- 65 18 96 % 4\' 8"  (1.422 m) --  Recent laboratory studies:  Recent Labs    09/15/22 0618  WBC 11.9*  HGB 10.6*  HCT 31.7*  PLT 185  NA 138  K 4.7  CL 104  CO2 23  BUN 20  CREATININE 1.00  GLUCOSE 126*  CALCIUM 9.3     Discharge Medications:   Allergies as of 09/15/2022       Reactions   Amoxicillin-pot Clavulanate Diarrhea, Nausea And Vomiting   Penicillins Diarrhea   Propoxyphene Rash, Other (See Comments)   violent Violent 01/25/22 Per [facility] allergy list brought with patient, patient unsure of exact reaction   Sulfa Antibiotics Rash   01/25/22 Patient unsure of exact reaction   Sulfamethoxazole Rash        Medication List     STOP taking these medications    OxyCODONE HCl (Abuse Deter) 5 MG  Taba Commonly known as: OXAYDO Replaced by: oxyCODONE 5 MG immediate release tablet       TAKE these medications    acetaminophen 325 MG tablet Commonly known as: TYLENOL Take 1-2 tablets (325-650 mg total) by mouth every 6 (six) hours as needed for mild pain (pain score 1-3 or temp > 100.5).   albuterol 108 (90 Base) MCG/ACT inhaler Commonly known as: VENTOLIN HFA Inhale 2 puffs into the lungs every 6 (six) hours as needed for wheezing or shortness of breath.   APPLE CIDER VINEGAR PO Take 2 tablets by mouth daily.   aspirin EC 81 MG tablet Take 81 mg by mouth daily. Swallow whole.   budesonide-formoterol 160-4.5 MCG/ACT inhaler Commonly known as: SYMBICORT Inhale 2 puffs into the lungs 2 (two) times daily as needed (shortness of breath).   COLLAGEN 1500/C PO Take 2 tablets by mouth daily.   cyanocobalamin 2000 MCG tablet Take 2,500 mcg by mouth daily.   Eliquis 5 MG Tabs tablet Generic drug: apixaban TAKE 1 TABLET (5 MG TOTAL) BY MOUTH EVERY 12 (TWELVE) HOURS FOR 60 DAYS What changed: See the new instructions.   fluticasone 50 MCG/ACT nasal spray Commonly known as: FLONASE Place 1 spray into both nostrils daily.   loperamide 2 MG tablet Commonly known as: IMODIUM A-D Take 2 mg by mouth 4 (four) times daily as needed for diarrhea or loose stools.   losartan 100 MG tablet Commonly known as: COZAAR Take 100 mg by mouth daily.   meclizine 25 MG tablet Commonly known as: ANTIVERT Take 25 mg by mouth 3 (three) times daily as needed for dizziness.   methocarbamol 500 MG tablet Commonly known as: ROBAXIN Take 1 tablet (500 mg total) by mouth every 6 (six) hours as needed for muscle spasms.   metoprolol succinate 25 MG 24 hr tablet Commonly known as: TOPROL-XL Take 25 mg by mouth daily.   niacin 500 MG tablet Commonly known as: VITAMIN B3 Take 500 mg by mouth at bedtime.   nitroGLYCERIN 0.4 MG SL tablet Commonly known as: NITROSTAT Place 0.4 mg under the  tongue every 5 (five) minutes as needed for chest pain.   ondansetron 4 MG tablet Commonly known as: ZOFRAN Take 4 mg by mouth every 8 (eight) hours as needed for nausea or vomiting.   OVER THE COUNTER MEDICATION Take 1 tablet by mouth daily. Dynamic  Brain support   oxyCODONE 5 MG immediate release tablet Commonly known as: Oxy IR/ROXICODONE Take 1-2 tablets (5-10 mg total) by mouth every 6 (six) hours as needed for moderate pain (pain score 4-6). Replaces: OxyCODONE HCl (Abuse Deter) 5 MG Taba  pantoprazole 40 MG tablet Commonly known as: PROTONIX Take 40 mg by mouth daily.   potassium chloride SA 20 MEQ tablet Commonly known as: Klor-Con M20 Take 1 tablet (20 mEq total) by mouth daily.   pyridOXINE 100 MG tablet Commonly known as: VITAMIN B6 Take 100 mg by mouth daily.   torsemide 10 MG tablet Commonly known as: DEMADEX Take 20 mg by mouth daily.   Vitamin D 125 MCG (5000 UT) Caps Take 5,000 Units by mouth daily. With Vit K2               Durable Medical Equipment  (From admission, onward)           Start     Ordered   09/14/22 1610  DME 3 n 1  Once        09/14/22 1609   09/14/22 1610  DME Walker rolling  Once       Question Answer Comment  Walker: With 5 Inch Wheels   Patient needs a walker to treat with the following condition Status post total replacement of left hip      09/14/22 1609            Diagnostic Studies: DG Pelvis Portable  Result Date: 09/14/2022 CLINICAL DATA:  [place] placement EXAM: PORTABLE PELVIS 1-2 VIEWS COMPARISON:  None Available. FINDINGS: LEFT hip total arthroplasty. Prosthetic components appear well seated. No fracture dislocation. IMPRESSION: Post total hip arthroplasty without complication. Electronically Signed   By: Suzy Bouchard M.D.   On: 09/14/2022 15:20   DG HIP UNILAT WITH PELVIS 1V LEFT  Result Date: 09/14/2022 CLINICAL DATA:  Left total hip arthroplasty. EXAM: DG HIP (WITH OR WITHOUT PELVIS) 1V*L*; DG  C-ARM 1-60 MIN-NO REPORT Radiation exposure index: 2.5263 mGy. COMPARISON:  August 09, 2022. FINDINGS: Nine intraoperative fluoroscopic images were obtained the left hip. The left acetabular and femoral components are well situated. IMPRESSION: Fluoroscopic guidance provided during left total hip arthroplasty. Electronically Signed   By: Marijo Conception M.D.   On: 09/14/2022 14:11   DG C-Arm 1-60 Min-No Report  Result Date: 09/14/2022 Fluoroscopy was utilized by the requesting physician.  No radiographic interpretation.   DG C-Arm 1-60 Min-No Report  Result Date: 09/14/2022 Fluoroscopy was utilized by the requesting physician.  No radiographic interpretation.    Disposition: Discharge disposition: 06-Home-Health Care Svc          Follow-up Information     Mcarthur Rossetti, MD Follow up in 2 week(s).   Specialty: Orthopedic Surgery Contact information: [address] [place] [address] [phone]                  Signed: Erskine Emery 09/15/2022, 8:43 AM

## 2022-09-15 NOTE — Progress Notes (Signed)
Physical Therapy Treatment  Patient Details Name: Alyssa Rosales MRN: [id number] DOB: 09-Jun-1951 Today's Date: 09/15/2022   History of Present Illness Patient is a 71 y/o female admitted for L direct anterior THA on 09/14/2022.  PMH positive for OSA, AAA, CAD, HLD, GERD, HTN , obesity and Atrial septal defect s/p closure.    PT Comments    Pt progressing towards physical therapy goals. Limited throughout session due to pain. Pt not receptive to education throughout session. Eager for d/c. Pt chose not to wait for a second therapy session and states desire to d/c home after first session. Feel this is reasonable, as pt moving without assistance overall and anticipate she will progress well as pain decreases. Pt anticipates HHPT to come tomorrow for evaluation. Will continue to follow. If pt decides she wants a second PT session prior to d/c, please let me know and I will accommodate.    Recommendations for follow up therapy are one component of a multi-disciplinary discharge planning process, led by the attending physician.  Recommendations may be updated based on patient status, additional functional criteria and insurance authorization.  Follow Up Recommendations  Follow physician's recommendations for discharge plan and follow up therapies     Assistance Recommended at Discharge Intermittent Supervision/Assistance  Patient can return home with the following A little help with walking and/or transfers;Assist for transportation;Help with stairs or ramp for entrance;A little help with bathing/dressing/bathroom   Equipment Recommendations  None recommended by PT    Recommendations for Other Services       Precautions / Restrictions Precautions Precautions: Fall Restrictions Weight Bearing Restrictions: Yes LLE Weight Bearing: Weight bearing as tolerated     Mobility  Bed Mobility               General bed mobility comments: Pt was recieved sitting up EOB.     Transfers Overall transfer level: Needs assistance Equipment used: Rolling walker (2 wheels) Transfers: Sit to/from Stand Sit to Stand: Min guard           General transfer comment: Increased time to power up to full stand. VC's for hand placement on seated surface for safety and not to pull on center bar of the walker. Noted uncontrolled descent down to chair at end of session.    Ambulation/Gait Ambulation/Gait assistance: Min guard Gait Distance (Feet): 175 Feet Assistive device: Rolling walker (2 wheels) Gait Pattern/deviations: Step-to pattern, Trunk flexed, Wide base of support, Decreased stance time - left, Antalgic, Step-through pattern Gait velocity: Decreased Gait velocity interpretation: <1.31 ft/sec, indicative of household ambulator   General Gait Details: Slow and guarded due to pain. Several short standing rest breaks due to pain. Pt required cues throughout for proper hand placement on walker and not leaning forward onto the center bar of the walker. Pt refusing to let PT lower walker to appropriate height.   Stairs Stairs: Yes Stairs assistance: Min guard Stair Management: Two rails, Step to pattern Number of Stairs: 1 General stair comments: Attempted with SPC to simulate home environment. Pt refusing to use it, however as she has a QC at home. Pt unable to elevate RLE up onto step with cane, and instead pulled up to next step with B rails despite not having at home. Pt refusing to attempt again without B rails to simulate home environment.   Wheelchair Mobility    Modified Rankin (Stroke Patients Only)       Balance Overall balance assessment: Needs assistance   Sitting balance-Leahy Scale: Fair  Standing balance support: Bilateral upper extremity supported, During functional activity, Reliant on assistive device for balance Standing balance-Leahy Scale: Poor                              Cognition Arousal/Alertness: Lethargic,  Suspect due to medications Behavior During Therapy: Flat affect Overall Cognitive Status: Impaired/Different from baseline Area of Impairment: Memory, Safety/judgement                     Memory: Decreased recall of precautions, Decreased short-term memory         General Comments: Pt does not recall having PT yesterday after surgery. Poor safety awareness in general.        Exercises Total Joint Exercises Ankle Circles/Pumps: 10 reps Quad Sets: 10 reps Short Arc Quad: 10 reps Heel Slides: 10 reps Hip ABduction/ADduction: 10 reps, AAROM Long Arc Quad: 10 reps    General Comments        Pertinent Vitals/Pain Pain Assessment Pain Assessment: Faces Faces Pain Scale: Hurts little more Pain Location: L hip Pain Descriptors / Indicators: Discomfort, Grimacing Pain Intervention(s): Limited activity within patient's tolerance, Monitored during session, Repositioned    Home Living                          Prior Function            PT Goals (current goals can now be found in the care plan section) Acute Rehab PT Goals Patient Stated Goal: to return to independent PT Goal Formulation: With patient/family Time For Goal Achievement: 09/18/22 Potential to Achieve Goals: Good Progress towards PT goals: Progressing toward goals    Frequency    7X/week      PT Plan Current plan remains appropriate    Co-evaluation              AM-PAC PT "6 Clicks" Mobility   Outcome Measure  Help needed turning from your back to your side while in a flat bed without using bedrails?: A Little Help needed moving from lying on your back to sitting on the side of a flat bed without using bedrails?: A Little Help needed moving to and from a bed to a chair (including a wheelchair)?: A Little Help needed standing up from a chair using your arms (e.g., wheelchair or bedside chair)?: A Little Help needed to walk in hospital room?: A Little Help needed climbing 3-5  steps with a railing? : Total 6 Click Score: 16    End of Session Equipment Utilized During Treatment: Gait belt Activity Tolerance: Patient limited by lethargy Patient left: in chair;with call bell/phone within reach;with family/visitor present Nurse Communication: Mobility status PT Visit Diagnosis: Difficulty in walking, not elsewhere classified (R26.2);Pain Pain - Right/Left: Left Pain - part of body: Hip     Time: [id number] PT Time Calculation (min) (ACUTE ONLY): 46 min  Charges:  $Gait Training: 23-37 mins $Therapeutic Exercise: 8-22 mins                     Rolinda Roan, PT, DPT Acute Rehabilitation Services Secure Chat Preferred Office: [phone]    Thelma Comp 09/15/2022, 10:56 AM

## 2022-09-15 NOTE — Discharge Instructions (Signed)

## 2022-09-16 ENCOUNTER — Encounter (HOSPITAL_COMMUNITY): Payer: Self-pay | Admitting: Orthopaedic Surgery

## 2022-09-24 ENCOUNTER — Telehealth: Payer: Self-pay | Admitting: Orthopedic Surgery

## 2022-09-24 NOTE — Telephone Encounter (Signed)
Orthopedic Telephone Call Note  Spoke with patient this morning. She was working with therapy this morning when it is was noted that there was some swelling around the hip. She has her dressing on and there has been no drainage. She is not having fevers/chills. Pain has been getting better since surgery. Was a little worse last night but pain medication is controlling the pain. She has not noticed any redness around the incision. She says she has an appt for Monday. She should keep that appointment. I told her to call back if she develops fever/chills, pain gets worse and not better with time, she develops drainage, or if she develops redness around the incision but for now she should just monitor the swelling.   London Sheer, MD Orthopedic Surgeon

## 2022-09-27 ENCOUNTER — Ambulatory Visit (INDEPENDENT_AMBULATORY_CARE_PROVIDER_SITE_OTHER): Payer: Medicare Other | Admitting: Orthopaedic Surgery

## 2022-09-27 ENCOUNTER — Encounter: Payer: Self-pay | Admitting: Orthopaedic Surgery

## 2022-09-27 DIAGNOSIS — Z96642 Presence of left artificial hip joint: Secondary | ICD-10-CM

## 2022-09-27 MED ORDER — OXYCODONE HCL 5 MG PO TABS: 5.0000 mg | ORAL_TABLET | Freq: Four times a day (QID) | ORAL | 0 refills | Status: DC | PRN

## 2022-09-27 MED ORDER — DOXYCYCLINE HYCLATE 100 MG PO TABS: 100.0000 mg | ORAL_TABLET | Freq: Two times a day (BID) | ORAL | 0 refills | Status: DC

## 2022-09-27 NOTE — Progress Notes (Signed)
The patient is here for first postop visit status post a left total hip arthroplasty.  Given her thigh size we did close the incision with sutures.  She is having some drainage.  On exam there is no redness fortunately.  There is drainage but she is on Eliquis.  I think a lot of this is related to that combined with her thigh size.  There is no wound breakdown.  I will refill her pain medication.  I did try to aspirate the seroma from the hip but was unsuccessful.  I would like to leave the stitches in for 1 more week and see her back next week for suture removal.  I will send in some doxycycline prophylactically.

## 2022-09-29 ENCOUNTER — Telehealth: Payer: Self-pay | Admitting: Orthopaedic Surgery

## 2022-09-29 NOTE — Telephone Encounter (Signed)
Patient aware that per Dr. Magnus Ivan she may change dry dressing as needed

## 2022-09-29 NOTE — Telephone Encounter (Signed)
Patient states she missed a call from someone in our office, I dont see any calls made, she states she is having oozing from left leg and her case nurse advised that they were going to call and as Dr. Magnus Ivan. Please call patient and advise..[phone]

## 2022-10-01 ENCOUNTER — Telehealth: Payer: Self-pay | Admitting: Orthopaedic Surgery

## 2022-10-01 NOTE — Telephone Encounter (Signed)
[place] forms received. To [employer]. ?

## 2022-10-04 ENCOUNTER — Ambulatory Visit (INDEPENDENT_AMBULATORY_CARE_PROVIDER_SITE_OTHER): Payer: Medicare Other

## 2022-10-04 ENCOUNTER — Ambulatory Visit (INDEPENDENT_AMBULATORY_CARE_PROVIDER_SITE_OTHER): Payer: Medicare Other | Admitting: Physician Assistant

## 2022-10-04 ENCOUNTER — Encounter: Payer: Self-pay | Admitting: Physician Assistant

## 2022-10-04 DIAGNOSIS — M25562 Pain in left knee: Secondary | ICD-10-CM

## 2022-10-04 DIAGNOSIS — Z96642 Presence of left artificial hip joint: Secondary | ICD-10-CM

## 2022-10-04 MED ORDER — MUPIROCIN 2 % EX OINT: 1.0000 | TOPICAL_OINTMENT | Freq: Every day | CUTANEOUS | 0 refills | Status: DC

## 2022-10-04 NOTE — Progress Notes (Signed)
HPI: [name] returns today status post left total hip arthroplasty 09/14/2022.  She is here for an incision check.  She states that she is now having left knee pain that started last Monday.  She states she is having some popping no acute injury left knee.  She is ambulating with a cane.  She is out of work and has been out of work since November 13.  She is on chronic Eliquis.  Overall hip pain is improving.  Physical exam: Left hip surgical incisions healing well distal incision there is an area wound breakdown due to skin necrosis no true dehiscence of the wound though.  No malodor.  No signs of overall infection.  Sutures well approximated incision.  Left knee: Patellofemoral crepitus.  No instability valgus varus stressing.  Slight ecchymosis over the lateral aspect of the knee.  Otherwise compartments soft.  No effusion.  Radiographs: Left knee 2 views: No acute fracture.  Knee is well located.  Mild tricompartmental arthritic changes.  No bony abnormalities otherwise.  Impression: Status post left total hip arthroplasty 09/14/2022 Left knee pain  Plan: Discussed with her that her knee pain could be just from from surgery and the manipulation we performed of the knee in order to do the hip procedure.  We will give this time.  In regards to her hip incision sutures removed Steri-Strips applied.  Distal one fourth where she has slight skin breakdown she will apply mupirocin ointment just a slight amount after showering and washing the incision with an antibacterial soap.  We will see her back in 1 week see how she is doing overall.  She will continue her antibiotics until they are finished.  Questions were encouraged and answered at length

## 2022-10-11 ENCOUNTER — Ambulatory Visit (INDEPENDENT_AMBULATORY_CARE_PROVIDER_SITE_OTHER): Payer: Medicare Other | Admitting: Physician Assistant

## 2022-10-11 ENCOUNTER — Encounter: Payer: Self-pay | Admitting: Physician Assistant

## 2022-10-11 DIAGNOSIS — M25562 Pain in left knee: Secondary | ICD-10-CM

## 2022-10-11 DIAGNOSIS — Z96642 Presence of left artificial hip joint: Secondary | ICD-10-CM | POA: Diagnosis not present

## 2022-10-11 MED ORDER — METHYLPREDNISOLONE ACETATE 40 MG/ML IJ SUSP
40.0000 mg | INTRAMUSCULAR | Status: AC | PRN
  Administered 2022-10-11: 40 mg via INTRA_ARTICULAR

## 2022-10-11 MED ORDER — LIDOCAINE HCL 1 % IJ SOLN
3.0000 mL | INTRAMUSCULAR | Status: AC | PRN
  Administered 2022-10-11: 3 mL

## 2022-10-11 NOTE — Progress Notes (Signed)
HPI: Alyssa Rosales returns today for follow-up of her left hip incision.  She underwent left total hip arthroplasty on 09/14/2022.  She states the hip is not really bothering her.  She has been applying mupirocin over the distal incision and washing the incision with antibacterial soap.  Had no fevers or chills.  She is mostly having left knee pain.  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  She is mostly taking her oxycodone and methocarbamol due to knee pain.  She is on chronic Eliquis.  Review of systems: See HPI otherwise negative  Physical exam: Left hip good range of motion without pain.  Surgical incisions healing well slight area of eschar distal incision but no signs of gross infection.  Left knee patellofemoral crepitus.  No abnormal warmth erythema or effusion.        Procedure Note  Patient: Alyssa Rosales             Date of Birth: November 16, 1950           MRN: [id number]             Visit Date: 10/11/2022  Procedures: Visit Diagnoses:  1. Status post total replacement of left hip   2. Acute pain of left knee     Large Joint Inj: L knee on 10/11/2022 12:22 PM Indications: pain Details: 22 G 1.5 in needle, anterolateral approach  Arthrogram: No  Medications: 3 mL lidocaine 1 %; 40 mg methylPREDNISolone acetate 40 MG/ML Outcome: tolerated well, no immediate complications Procedure, treatment alternatives, risks and benefits explained, specific risks discussed. Consent was given by the patient. Immediately prior to procedure a time out was called to verify the correct patient, procedure, equipment, support staff and site/side marked as required. Patient was prepped and draped in the usual sterile fashion.      Impression: Status post left total hip arthroplasty Left knee pain  Plan: Given her continued pain in the knee recommend cortisone injection which amendable to this treatment.  I have her follow-up with [place] in 2 weeks see how  her knee is doing and also to check her wound.  She will continue to apply mupirocin distal wound.  Follow-up with [place] sooner if there is any questions concerns.

## 2022-10-15 DIAGNOSIS — M65839 Other synovitis and tenosynovitis, unspecified forearm: Secondary | ICD-10-CM

## 2022-10-15 DIAGNOSIS — M65939 Unspecified synovitis and tenosynovitis, unspecified forearm: Secondary | ICD-10-CM

## 2022-10-15 DIAGNOSIS — R52 Pain, unspecified: Secondary | ICD-10-CM | POA: Insufficient documentation

## 2022-10-15 HISTORY — DX: Pain, unspecified: R52

## 2022-10-15 HISTORY — DX: Unspecified synovitis and tenosynovitis, unspecified forearm: M65.939

## 2022-10-21 ENCOUNTER — Encounter: Payer: Self-pay | Admitting: Physician Assistant

## 2022-10-21 ENCOUNTER — Ambulatory Visit (INDEPENDENT_AMBULATORY_CARE_PROVIDER_SITE_OTHER): Payer: Medicare Other | Admitting: Physician Assistant

## 2022-10-21 DIAGNOSIS — Z96642 Presence of left artificial hip joint: Secondary | ICD-10-CM

## 2022-10-21 MED ORDER — DOXYCYCLINE HYCLATE 100 MG PO TABS: 100.0000 mg | ORAL_TABLET | Freq: Two times a day (BID) | ORAL | 0 refills | Status: DC

## 2022-10-21 NOTE — Progress Notes (Signed)
HPI: Alyssa Rosales returns today for follow-up of her left total hip surgical incision wound.  She states overall her hip is doing well.  Feels that the incision is healing well.  She has had no fevers chills.  Still with some knee left pain mainly medial.  No new injury.  States she really has no pain in the hip pain is mostly knee.  Physical exam: Left hip distal incision area of scab there is a visible Vicryl stitch that is split.  No gross signs of infection.  No malodor.  Remainder the incisions well-healed.  There is no erythema about the incision.  Fibrinous tissue was removed stitches were removed the area of dehiscence does track into the dermis layer.  The area of dehiscence is nummular measures approximately half centimeter diameter. Left knee good range of motion.  Tenderness over the pes anserinus area.  No abnormal warmth erythema.  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  Then a nylon stitch was applied loosely closed with area of dehiscence.  Patient tolerated well.  She will wash the area with antibacterial soap.  Will place her on doxycycline twice daily for 14 days.  Will have her follow-up with [place] in 1 week to recheck the wound.  She will call us if she has any drainage signs of infection are any other questions.  In regards to her knee and the pes anserinus bursitis we will have her apply Voltaren gel up to 4 g 4 times daily area of maximal tenderness.  Questions were encouraged and answered at length

## 2022-10-27 ENCOUNTER — Other Ambulatory Visit: Payer: Self-pay | Admitting: Orthopaedic Surgery

## 2022-10-27 ENCOUNTER — Other Ambulatory Visit: Payer: Self-pay | Admitting: Physician Assistant

## 2022-10-29 ENCOUNTER — Encounter: Payer: Self-pay | Admitting: Physician Assistant

## 2022-10-29 ENCOUNTER — Other Ambulatory Visit: Payer: Self-pay | Admitting: Internal Medicine

## 2022-10-29 ENCOUNTER — Ambulatory Visit (INDEPENDENT_AMBULATORY_CARE_PROVIDER_SITE_OTHER): Payer: Medicare Other | Admitting: Physician Assistant

## 2022-10-29 DIAGNOSIS — Z1231 Encounter for screening mammogram for malignant neoplasm of breast: Secondary | ICD-10-CM

## 2022-10-29 DIAGNOSIS — Z96642 Presence of left artificial hip joint: Secondary | ICD-10-CM

## 2022-10-29 NOTE — Progress Notes (Addendum)
Office Visit Note   Patient: Alyssa Rosales           Date of Birth: 30-Dec-1950           MRN: [id number] Visit Date: 10/29/2022              Requested by: [name], MD [address] [place],  [place] [address] PCP: [name], MD  Chief Complaint  Patient presents with   Left Hip - Wound Check      HPI: Ms. Alyssa Rosales comes in today for wound check of her left hip.  She is 6-week status post left total hip arthroplasty with Dr. Magnus Ivan.  She was seen by Kriste Basque last week and had a small area of dehiscence at the distal wound.  No evidence of any infection.  He thoroughly cleansed and debrided the area and loosely put a nylon suture.  She has been following his care instructions she denies any pain fever or chills she denies any drainage from the wound  Assessment & Plan: Visit Diagnoses: Left total hip replacement  Plan: She has no signs of infection today wound dehiscence bout the same from previous description but no sign of progression or infection today The suture that was placed last week did pull out and is just along the skin edge.  She has no cellulitis.  She has a small area of granulation tissue without any surrounding foul odor drainage or erythema she is nontender to palpation and there is no fluctuance.  She will continue current wound care we will remove the sutures she will follow-up with Gill in 1 week.  She understands if she has any increased drainage fever chills or pain she is to contact us immediately  Follow-Up Instructions: 1 week  Ortho Exam  Patient is alert, oriented, no adenopathy, well-dressed, normal affect, normal respiratory effort. Examination patient appears well.  Examination of the wound there is approximately a 1 cm area of wound dehiscence at the distal end of the incision.  Does not probe deeply.  There is no purulent drainage no fluctuance in the surrounding tissue no erythema.  She is nontender to palpation.  There is a 1  nylon stitch that is in the skin on 1 side  Imaging: No results found. No images are attached to the encounter.  Labs: Lab Results  Component Value Date   HGBA1C 5.8 (H) 11/27/2019   REPTSTATUS 11/19/2020 FINAL 11/14/2020   GRAMSTAIN  11/14/2020    RARE WBC PRESENT, PREDOMINANTLY PMN NO ORGANISMS SEEN    CULT  11/14/2020    FEW METHICILLIN RESISTANT STAPHYLOCOCCUS AUREUS NO ANAEROBES ISOLATED Performed at [facility], [address] [place], [place] [address]    LABORGA METHICILLIN RESISTANT STAPHYLOCOCCUS AUREUS 11/14/2020     Lab Results  Component Value Date   ALBUMIN 4.4 11/27/2019    Lab Results  Component Value Date   MG 1.9 11/27/2019   Lab Results  Component Value Date   VD25OH 19.6 (L) 11/27/2019    No results found for: "PREALBUMIN"    Latest Ref Rng & Units 09/15/2022    6:18 AM 09/07/2022    3:08 PM 11/20/2021    1:58 PM  CBC EXTENDED  WBC 4.0 - 10.5 K/uL 11.9  4.1  6.1   RBC 3.87 - 5.11 MIL/uL 3.20  3.66  4.07   Hemoglobin 12.0 - 15.0 g/dL 03.4  74.2  59.5   HCT 36.0 - 46.0 % 31.7  36.6  36.7   Platelets 150 - 400 K/uL 185  199  217      There is no height or weight on file to calculate BMI.  Orders:  No orders of the defined types were placed in this encounter.  No orders of the defined types were placed in this encounter.    Procedures: No procedures performed  Clinical Data: No additional findings.  ROS:  All other systems negative, except as noted in the HPI. Review of Systems  Objective: Vital Signs: There were no vitals taken for this visit.  Specialty Comments:  No specialty comments available.  PMFS History: Patient Active Problem List   Diagnosis Date Noted   Status post total replacement of left hip 09/14/2022   Thrombocytopenia (HCC) 03/15/2022   Postoperative atrial fibrillation (HCC) 02/20/2022   Acute postoperative pain 02/16/2022   Postoperative anemia due to acute blood loss 02/16/2022   S/P  atrial septal defect closure, surgical 02/16/2022   Atrial septal defect 09/14/2021   Preop cardiovascular exam 09/10/2021   Morbid obesity (HCC) 06/17/2020   Atrophic vaginitis 06/08/2019   Overactive thyroid gland 04/19/2019   Abdominal aortic aneurysm (AAA) without rupture (HCC) 09/01/2017   History of thyroid nodule 04/21/2017   Coronary artery disease involving native coronary artery of native heart without angina pectoris 02/09/2017   Thoracic aortic aneurysm without rupture (HCC) 02/09/2017   Hyperlipidemia 01/27/2017   OSA (obstructive sleep apnea) 09/30/2016   Moderate persistent asthma without complication 08/10/2016   Restrictive lung disease 08/10/2016   Lumbar pain with radiation down both legs 06/01/2016   Bilateral hip pain 06/01/2016   Dyslipidemia 02/16/2016   Dyspnea on exertion 02/16/2016   Precordial pain 02/16/2016   GERD without esophagitis 01/07/2016   Essential hypertension 01/07/2016   Morbid obesity with BMI of 45.0-49.9, adult (HCC) 01/07/2016   Malaise and fatigue 01/07/2016   Abnormal glucose 01/07/2016   Primary osteoarthritis involving multiple joints 01/07/2016   Past Medical History:  Diagnosis Date   Abdominal aortic aneurysm (AAA) without rupture (HCC) 09/01/2017   Arthritis    Coronary artery disease involving native coronary artery of native heart without angina pectoris 02/09/2017   Edema, lower extremity    GERD without esophagitis 01/07/2016   Last Assessment & Plan:  Continue with the PPI and have refilled this for her   History of kidney problems    History of kidney stones    Hyperlipidemia 01/27/2017   Overview:  Added automatically from request for surgery [id number]   Hypertension    Joint pain    Kidney stone    Moderate persistent asthma without complication 08/10/2016   OSA (obstructive sleep apnea) 09/30/2016   Follows with pulmonary is seen 12/19 for FU bipap 21/15   Pneumonia    many years ago   Vitamin D deficiency      Family History  Problem Relation Age of Onset   Alzheimer's disease Mother    Hyperlipidemia Mother    Hypertension Mother    Diabetes Mother    Hypertension Father    Hyperlipidemia Father    Breast cancer Neg Hx     Past Surgical History:  Procedure Laterality Date   AMPUTATION Right 10/21/2020   Procedure: AMPUTATION RIGHT MIDDLE FINGER;  Surgeon: Cindee Salt, MD;  Location: [facility];  Service: Orthopedics;  Laterality: Right;  IV REGIONAL FOREARM BLOCK, BIER BLOCK   ATRIAL SEPTAL DEFECT(ASD) CLOSURE  2023   COLONOSCOPY     CORONARY ANGIOPLASTY  INCISION AND DRAINAGE Right 11/14/2020   Procedure: INCISION AND DRAINAGE RIGHT MIDDLE FINGER;  Surgeon: Cindee Salt, MD;  Location: [facility];  Service: Orthopedics;  Laterality: Right;   kidney stone removal     RECTAL SURGERY     x2. both as a child   spur removal     TEE WITHOUT CARDIOVERSION N/A 11/26/2021   Procedure: TRANSESOPHAGEAL ECHOCARDIOGRAM (TEE);  Surgeon: Lewayne Bunting, MD;  Location: [facility];  Service: Cardiovascular;  Laterality: N/A;   TOOTH EXTRACTION     TOTAL HIP ARTHROPLASTY Left 09/14/2022   Procedure: LEFT TOTAL HIP ARTHROPLASTY ANTERIOR APPROACH;  Surgeon: Kathryne Hitch, MD;  Location: [facility];  Service: Orthopedics;  Laterality: Left;   TUBAL LIGATION  1984   Social History   Occupational History   Occupation: [employer]   Tobacco Use   Smoking status: Never   Smokeless tobacco: Never  Vaping Use   Vaping Use: Never used  Substance and Sexual Activity   Alcohol use: Never   Drug use: Never   Sexual activity: Not Currently    Birth control/protection: Post-menopausal

## 2022-11-03 ENCOUNTER — Ambulatory Visit
Admission: RE | Admit: 2022-11-03 | Discharge: 2022-11-03 | Disposition: A | Discharge status: Home / Self Care | Payer: Medicare Other | Source: Ambulatory Visit | Attending: Internal Medicine | Admitting: Internal Medicine

## 2022-11-03 DIAGNOSIS — Z1231 Encounter for screening mammogram for malignant neoplasm of breast: Secondary | ICD-10-CM

## 2022-11-04 ENCOUNTER — Ambulatory Visit (INDEPENDENT_AMBULATORY_CARE_PROVIDER_SITE_OTHER): Payer: Medicare Other | Admitting: Physician Assistant

## 2022-11-04 ENCOUNTER — Encounter: Payer: Self-pay | Admitting: Physician Assistant

## 2022-11-04 DIAGNOSIS — Z96642 Presence of left artificial hip joint: Secondary | ICD-10-CM

## 2022-11-04 NOTE — Progress Notes (Signed)
HPI: Alyssa Rosales returns today for follow-up of her left hip wound.  She is overall doing well.  She states she is finishing the doxycycline and tolerated this well had no fevers or chills.  Physical exam: Left hip good range of motion without pain ambulates without any assistive devices.  Nonantalgic gait.  Distal incision area of wound dehiscence with the scab over the area.  There is no erythema.  He no expressible purulence.  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  She will follow-up with [place] in 2 weeks for wound check sooner if there is any questions or concerns.

## 2022-11-17 DIAGNOSIS — D649 Anemia, unspecified: Secondary | ICD-10-CM

## 2022-11-17 HISTORY — DX: Anemia, unspecified: D64.9

## 2022-11-18 ENCOUNTER — Ambulatory Visit (INDEPENDENT_AMBULATORY_CARE_PROVIDER_SITE_OTHER): Payer: Medicare Other | Admitting: Physician Assistant

## 2022-11-18 ENCOUNTER — Encounter: Payer: Self-pay | Admitting: Physician Assistant

## 2022-11-18 DIAGNOSIS — Z96642 Presence of left artificial hip joint: Secondary | ICD-10-CM

## 2022-11-18 NOTE — Progress Notes (Signed)
  HPI: Alyssa Rosales returns today for follow-up of her left hip wound.  She is status post left total hip arthroplasty 09/14/2022.  States her hip is doing well.  She has had no fevers or chills.  She has no complaints.  Physical exam: General well-developed well-nourished female in no acute distress ambulates without any assistive device and a nonantalgic gait. Left hip: Left hip surgical incisions healing well distal scabbed area remains without any signs of gross infection.  She has good range of motion of the left hip without pain.  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  Will see her back in 2 weeks hopefully return to work at that time.  Questions were encouraged and answered

## 2022-11-29 ENCOUNTER — Other Ambulatory Visit: Payer: Self-pay | Admitting: Cardiology

## 2022-11-29 DIAGNOSIS — I4891 Unspecified atrial fibrillation: Secondary | ICD-10-CM

## 2022-11-30 NOTE — Telephone Encounter (Signed)
Prescription refill request for Eliquis received. Indication: PAF Last office visit: 08/02/22  Nelta Numbers MD Scr: 1.02 on 11/17/22  Epic Age: 72 Weight: 90kg  Based on above findings Eliquis 5mg  twice daily is the appropriate dose.  Refill approved.

## 2022-12-02 ENCOUNTER — Ambulatory Visit (INDEPENDENT_AMBULATORY_CARE_PROVIDER_SITE_OTHER): Payer: Medicare Other

## 2022-12-02 ENCOUNTER — Ambulatory Visit (INDEPENDENT_AMBULATORY_CARE_PROVIDER_SITE_OTHER): Payer: Medicare Other | Admitting: Physician Assistant

## 2022-12-02 ENCOUNTER — Encounter: Payer: Self-pay | Admitting: Physician Assistant

## 2022-12-02 DIAGNOSIS — M25561 Pain in right knee: Secondary | ICD-10-CM | POA: Diagnosis not present

## 2022-12-02 DIAGNOSIS — Z96642 Presence of left artificial hip joint: Secondary | ICD-10-CM

## 2022-12-02 NOTE — Progress Notes (Signed)
Office Visit Note   Patient: Alyssa Rosales           Date of Birth: Jan 15, 1951           MRN: [id number] Visit Date: 12/02/2022              Requested by: Raina Mina., MD [address] [place],  [place] [address] PCP: Raina Mina., MD   Assessment & Plan: Visit Diagnoses:  1. Acute pain of right knee   2. Status post total replacement of left hip     Plan: She will apply moist heat to the right knee.  Follow-up with [place] pain persist or becomes worse.  She also call our office if he develops any infections which are reviewed with the over the right knee.  In regards to her left hip we will see her back at 6 months postop obtain an AP pelvis at that time.  Sooner if there is any questions concerns.  Follow-Up Instructions: Return in about 4 months (around 04/02/2023).   Orders:  Orders Placed This Encounter  Procedures   XR Knee 1-2 Views Right   No orders of the defined types were placed in this encounter.     Procedures: No procedures performed   Clinical Data: No additional findings.   Subjective: Chief Complaint  Patient presents with   Left Hip - Routine Post Op    HPI Alyssa Rosales returns today due to right knee pain after a fall 2 weeks ago in her kitchen.  She slipped and fell onto her knee no loss of consciousness.  She notes bruising over the anterior aspect of her knee.  In regards to her left total hip she states her hip is doing well.  Surgical incisions healing well where she had an area of dehiscence.  Had no fevers or chills. Review of Systems See HPI  Objective: Vital Signs: There were no vitals taken for this visit.  Physical Exam Constitutional:      Appearance: She is not ill-appearing or diaphoretic.  Neurological:     Mental Status: She is oriented to person, place, and time.  Psychiatric:        Mood and Affect: Mood normal.     Ortho Exam Left hip surgical incisions healing well.  There is a small scab over the area of  dehiscence but no signs of infection.  Right knee over the prepatellar region.  Is.  Hematoma.  Able to aspirate a couple cc and then express approximately 3-5 more cc out of the knee patient tolerated well.  Right knee no instability valgus varus stressing.  Good range of motion.  Significant bruising down into the right tib-fib 2 approximately midshaft. Specialty Comments:  No specialty comments available.  Imaging: XR Knee 1-2 Views Right  Result Date: 12/02/2022 Right knee 2 views: No acute fracture or acute findings.  Knee is well located.  Minimal arthritic changes.    PMFS History: Patient Active Problem List   Diagnosis Date Noted   Status post total replacement of left hip 09/14/2022   Thrombocytopenia ([place]) 03/15/2022   Postoperative atrial fibrillation ([place]) 02/20/2022   Acute postoperative pain 02/16/2022   Postoperative anemia due to acute blood loss 02/16/2022   S/P atrial septal defect closure, surgical 02/16/2022   Atrial septal defect 09/14/2021   Preop cardiovascular exam 09/10/2021   Morbid obesity ([place]) 06/17/2020   Atrophic vaginitis 06/08/2019   Overactive thyroid gland 04/19/2019   Abdominal aortic aneurysm (AAA)  without rupture ([place]) 09/01/2017   History of thyroid nodule 04/21/2017   Coronary artery disease involving native coronary artery of native heart without angina pectoris 02/09/2017   Thoracic aortic aneurysm without rupture ([place]) 02/09/2017   Hyperlipidemia 01/27/2017   OSA (obstructive sleep apnea) 09/30/2016   Moderate persistent asthma without complication 69/62/9528   Restrictive lung disease 08/10/2016   Lumbar pain with radiation down both legs 06/01/2016   Bilateral hip pain 06/01/2016   Dyslipidemia 02/16/2016   Dyspnea on exertion 02/16/2016   Precordial pain 02/16/2016   GERD without esophagitis 01/07/2016   Essential hypertension 01/07/2016   Morbid obesity with BMI of 45.0-49.9, adult ([place]) 01/07/2016   Malaise and fatigue 01/07/2016    Abnormal glucose 01/07/2016   Primary osteoarthritis involving multiple joints 01/07/2016   Past Medical History:  Diagnosis Date   Abdominal aortic aneurysm (AAA) without rupture ([place]) 09/01/2017   Arthritis    Coronary artery disease involving native coronary artery of native heart without angina pectoris 02/09/2017   Edema, lower extremity    GERD without esophagitis 01/07/2016   Last Assessment & Plan:  Continue with the PPI and have refilled this for her   History of kidney problems    History of kidney stones    Hyperlipidemia 01/27/2017   Overview:  Added automatically from request for surgery [id number]   Hypertension    Joint pain    Kidney stone    Moderate persistent asthma without complication 08/28/2535   OSA (obstructive sleep apnea) 09/30/2016   Follows with pulmonary is seen 12/19 for FU bipap 21/15   Pneumonia    many years ago   Vitamin D deficiency     Family History  Problem Relation Age of Onset   Alzheimer's disease Mother    Hyperlipidemia Mother    Hypertension Mother    Diabetes Mother    Hypertension Father    Hyperlipidemia Father    Breast cancer Neg Hx     Past Surgical History:  Procedure Laterality Date   AMPUTATION Right 10/21/2020   Procedure: AMPUTATION RIGHT MIDDLE FINGER;  Surgeon: Daryll Brod, MD;  Location: [facility];  Service: Orthopedics;  Laterality: Right;  IV REGIONAL FOREARM BLOCK, BIER BLOCK   ATRIAL SEPTAL DEFECT(ASD) CLOSURE  2023   COLONOSCOPY     CORONARY ANGIOPLASTY     INCISION AND DRAINAGE Right 11/14/2020   Procedure: INCISION AND DRAINAGE RIGHT MIDDLE FINGER;  Surgeon: Daryll Brod, MD;  Location: [facility];  Service: Orthopedics;  Laterality: Right;   kidney stone removal     RECTAL SURGERY     x2. both as a child   spur removal     TEE WITHOUT CARDIOVERSION N/A 11/26/2021   Procedure: TRANSESOPHAGEAL ECHOCARDIOGRAM (TEE);  Surgeon: Lelon Perla, MD;  Location: [facility];   Service: Cardiovascular;  Laterality: N/A;   TOOTH EXTRACTION     TOTAL HIP ARTHROPLASTY Left 09/14/2022   Procedure: LEFT TOTAL HIP ARTHROPLASTY ANTERIOR APPROACH;  Surgeon: Mcarthur Rossetti, MD;  Location: [facility];  Service: Orthopedics;  Laterality: Left;   TUBAL LIGATION  1984   Social History   Occupational History   Occupation: [employer]   Tobacco Use   Smoking status: Never   Smokeless tobacco: Never  Vaping Use   Vaping Use: Never used  Substance and Sexual Activity   Alcohol use: Never   Drug use: Never   Sexual activity: Not Currently    Birth control/protection: Post-menopausal

## 2023-01-22 ENCOUNTER — Other Ambulatory Visit: Payer: Self-pay | Admitting: Orthopaedic Surgery

## 2023-02-21 ENCOUNTER — Other Ambulatory Visit: Payer: Self-pay | Admitting: Orthopaedic Surgery

## 2023-02-22 NOTE — Telephone Encounter (Signed)
Lvm for pt to cb 

## 2023-02-22 NOTE — Telephone Encounter (Signed)
I called and talked to the pt. No infection.said wanted antibiotic for muscle spasms in her leg. I told her we dont send this in for muscle spasms. She doesn't have a muscle relaxer. Would you consider calling this in for her?

## 2023-02-23 ENCOUNTER — Other Ambulatory Visit: Payer: Self-pay | Admitting: Physician Assistant

## 2023-02-23 MED ORDER — METHOCARBAMOL 500 MG PO TABS: 500.0000 mg | ORAL_TABLET | Freq: Four times a day (QID) | ORAL | 1 refills | Status: DC | PRN

## 2023-03-02 ENCOUNTER — Encounter: Payer: Self-pay | Admitting: Cardiology

## 2023-03-02 ENCOUNTER — Ambulatory Visit: Payer: Medicare Other | Attending: Cardiology | Admitting: Cardiology

## 2023-03-02 VITALS — BP 122/70 | HR 70 | Ht <= 58 in | Wt 215.8 lb

## 2023-03-02 DIAGNOSIS — Z9889 Other specified postprocedural states: Secondary | ICD-10-CM | POA: Insufficient documentation

## 2023-03-02 DIAGNOSIS — I10 Essential (primary) hypertension: Secondary | ICD-10-CM | POA: Diagnosis present

## 2023-03-02 DIAGNOSIS — I6789 Other cerebrovascular disease: Secondary | ICD-10-CM

## 2023-03-02 DIAGNOSIS — I2510 Atherosclerotic heart disease of native coronary artery without angina pectoris: Secondary | ICD-10-CM | POA: Diagnosis present

## 2023-03-02 DIAGNOSIS — I7143 Infrarenal abdominal aortic aneurysm, without rupture: Secondary | ICD-10-CM | POA: Insufficient documentation

## 2023-03-02 DIAGNOSIS — E785 Hyperlipidemia, unspecified: Secondary | ICD-10-CM | POA: Insufficient documentation

## 2023-03-02 NOTE — Progress Notes (Signed)
Cardiology Office Note:    Date:  03/02/2023   ID:  Alyssa Rosales, DOB 07-06-1951, MRN [id number]  PCP:  [name], MD  Cardiologist:  [name], MD    Referring MD: [name], MD   Chief Complaint  Patient presents with   Chest Pain   Low HR   Medication Management    Losartan was d/c due to low HR perPCP    History of Present Illness:    Alyssa Rosales is a 72 y.o. female  with past medical history significant for ASD repair in April of2023 at [facility], essential hypertension coronary disease but only limited based on cardiac catheterization, ascending aortic aneurysm measuring 43 mm, episode of atrial fibrillation that she had done after surgery with amiodarone being discontinued on May 16.  Since have seen her last time she got hip surgery and recovered quite nicely doing well she is very happy because she is back at work.  Denies have any typical chest pain tightness squeezing pressure burning chest no palpitations dizziness swelling of lower extremities.  She does describe however to have some sharp stabbing-like chest pain not related to exercise  Past Medical History:  Diagnosis Date   Abdominal aortic aneurysm (AAA) without rupture (HCC) 09/01/2017   Abnormal glucose 01/07/2016   Last Assessment & Plan:   Formatting of this note might be different from the original.  Update her lab for this for her   Acute postoperative pain 02/16/2022   Anemia 11/17/2022   Arthritis    Atrophic vaginitis 06/08/2019   Coronary artery disease involving native coronary artery of native heart without angina pectoris 02/09/2017   Dyslipidemia 02/16/2016   Last Assessment & Plan:   Continue to follow this for her   Dyspnea on exertion 02/16/2016   Edema, lower extremity    Extensor tenosynovitis of wrist 10/15/2022   GERD without esophagitis 01/07/2016   Last Assessment & Plan:  Continue with the PPI and have refilled this for her   History of atrial fibrillation  02/20/2022   History of kidney problems    History of kidney stones    History of thyroid nodule 04/21/2017   Formatting of this note might be different from the original.  Last US revealed did not need biopsy   Hyperlipidemia 01/27/2017   Overview:  Added automatically from request for surgery [id number]   Hypertension    Inflammatory pain 10/15/2022   Joint pain    Kidney stone    Lumbar pain with radiation down both legs 06/01/2016   Last Assessment & Plan:   Formatting of this note might be different from the original.  She was unable to have this done due to feeling claustrophobic and she is going to have it redone when she follows up after her pulmonary Fu   Malaise and fatigue 01/07/2016   Last Assessment & Plan:   Formatting of this note might be different from the original.  There is some improvement for her and she is pleased though hesitant with how she is doing that it is going to hold, and frustrated, she realizes she has had to change some of her lifestyle as well to accomodate this, she does not want to quit work though if she can avoid   Moderate persistent asthma without complication 08/10/2016   Morbid obesity (HCC) 06/17/2020   Morbid obesity with BMI of 45.0-49.9, adult (HCC) 01/07/2016   Last Assessment & Plan:   She and I discussed in depth  that she has to work on her diet more than what she has been doing , she understands this    OSA (obstructive sleep apnea) 09/30/2016   Follows with pulmonary is seen 12/19 for FU bipap 21/15   Overactive thyroid gland 04/19/2019   Pneumonia    many years ago   Postoperative anemia due to acute blood loss 02/16/2022   Precordial pain 02/16/2016   Overview:   Added automatically from request for surgery [id number]   Preop cardiovascular exam 09/10/2021   Primary osteoarthritis involving multiple joints 01/07/2016   Last Assessment & Plan:   Formatting of this note might be different from the original.  She has not been able to get the  mri yet, even though it was "open" it was still difficult for her, would like to see how her PFT are first before give her something like valium to relax her for this test   Restrictive lung disease 08/10/2016   RLS (restless legs syndrome) 09/30/2016   S/P atrial septal defect closure, surgical 02/16/2022   Status post total replacement of left hip 09/14/2022   Thinning hair 10/04/2018   Thrombocytopenia (HCC) 03/15/2022   Vitamin D deficiency     Past Surgical History:  Procedure Laterality Date   AMPUTATION Right 10/21/2020   Procedure: AMPUTATION RIGHT MIDDLE FINGER;  Surgeon: Cindee Salt, MD;  Location: [facility];  Service: Orthopedics;  Laterality: Right;  IV REGIONAL FOREARM BLOCK, BIER BLOCK   ATRIAL SEPTAL DEFECT(ASD) CLOSURE  2023   COLONOSCOPY     CORONARY ANGIOPLASTY     INCISION AND DRAINAGE Right 11/14/2020   Procedure: INCISION AND DRAINAGE RIGHT MIDDLE FINGER;  Surgeon: Cindee Salt, MD;  Location: [facility];  Service: Orthopedics;  Laterality: Right;   kidney stone removal     RECTAL SURGERY     x2. both as a child   spur removal     TEE WITHOUT CARDIOVERSION N/A 11/26/2021   Procedure: TRANSESOPHAGEAL ECHOCARDIOGRAM (TEE);  Surgeon: Lewayne Bunting, MD;  Location: [facility];  Service: Cardiovascular;  Laterality: N/A;   TOOTH EXTRACTION     TOTAL HIP ARTHROPLASTY Left 09/14/2022   Procedure: LEFT TOTAL HIP ARTHROPLASTY ANTERIOR APPROACH;  Surgeon: Kathryne Hitch, MD;  Location: [facility];  Service: Orthopedics;  Laterality: Left;   TUBAL LIGATION  1984    Current Medications: Current Meds  Medication Sig   acetaminophen (TYLENOL) 325 MG tablet Take 1-2 tablets (325-650 mg total) by mouth every 6 (six) hours as needed for mild pain (pain score 1-3 or temp > 100.5).   albuterol (VENTOLIN HFA) 108 (90 Base) MCG/ACT inhaler Inhale 2 puffs into the lungs every 6 (six) hours as needed for wheezing or shortness of breath.    apixaban (ELIQUIS) 5 MG TABS tablet Take 1 tablet (5 mg total) by mouth 2 (two) times daily.   APPLE CIDER VINEGAR PO Take 2 tablets by mouth daily.   aspirin EC 81 MG tablet Take 81 mg by mouth daily. Swallow whole.   budesonide-formoterol (SYMBICORT) 160-4.5 MCG/ACT inhaler Inhale 2 puffs into the lungs 2 (two) times daily as needed (shortness of breath).   Cholecalciferol (VITAMIN D) 125 MCG (5000 UT) CAPS Take 5,000 Units by mouth daily. With Vit K2   Collagen-Vitamin C-Biotin (COLLAGEN 1500/C PO) Take 2 tablets by mouth daily.   cyanocobalamin 2000 MCG tablet Take 2,500 mcg by mouth daily.   doxycycline (VIBRA-TABS) 100 MG tablet TAKE 1 TABLET BY MOUTH TWICE A DAY  fluticasone (FLONASE) 50 MCG/ACT nasal spray Place 1 spray into both nostrils daily.   loperamide (IMODIUM A-D) 2 MG tablet Take 2 mg by mouth 4 (four) times daily as needed for diarrhea or loose stools.   meclizine (ANTIVERT) 25 MG tablet Take 25 mg by mouth 3 (three) times daily as needed for dizziness.   methocarbamol (ROBAXIN) 500 MG tablet Take 1 tablet (500 mg total) by mouth every 6 (six) hours as needed for muscle spasms.   metoprolol succinate (TOPROL-XL) 25 MG 24 hr tablet Take 25 mg by mouth daily.   mupirocin ointment (BACTROBAN) 2 % APPLY 1 APPLICATION TOPICALLY DAILY   niacin (VITAMIN B3) 500 MG tablet Take 500 mg by mouth at bedtime.   nitroGLYCERIN (NITROSTAT) 0.4 MG SL tablet Place 0.4 mg under the tongue every 5 (five) minutes as needed for chest pain.   ondansetron (ZOFRAN) 4 MG tablet Take 4 mg by mouth every 8 (eight) hours as needed for nausea or vomiting.   OVER THE COUNTER MEDICATION Take 1 tablet by mouth daily. Dynamic  Brain support   oxyCODONE (OXY IR/ROXICODONE) 5 MG immediate release tablet Take 1-2 tablets (5-10 mg total) by mouth every 6 (six) hours as needed for moderate pain (pain score 4-6).   pantoprazole (PROTONIX) 40 MG tablet Take 40 mg by mouth daily.   potassium chloride SA (KLOR-CON M20)  20 MEQ tablet Take 1 tablet (20 mEq total) by mouth daily.   pyridOXINE (VITAMIN B6) 100 MG tablet Take 100 mg by mouth daily.   torsemide (DEMADEX) 10 MG tablet Take 20 mg by mouth daily.   [DISCONTINUED] losartan (COZAAR) 100 MG tablet Take 100 mg by mouth daily.     Allergies:   Amoxicillin-pot clavulanate, Penicillins, Propoxyphene, Sulfa antibiotics, and Sulfamethoxazole   Social History   Socioeconomic History   Marital status: Divorced    Spouse name: Not on file   Number of children: Not on file   Years of education: Not on file   Highest education level: Not on file  Occupational History   Occupation: [employer]   Tobacco Use   Smoking status: Never   Smokeless tobacco: Never  Vaping Use   Vaping Use: Never used  Substance and Sexual Activity   Alcohol use: Never   Drug use: Never   Sexual activity: Not Currently    Birth control/protection: Post-menopausal  Other Topics Concern   Not on file  Social History Narrative   Not on file   Social Determinants of Health   Financial Resource Strain: Not on file  Food Insecurity: Not on file  Transportation Needs: Not on file  Physical Activity: Not on file  Stress: Not on file  Social Connections: Not on file     Family History: The patient's family history includes Alzheimer's disease in her mother; Diabetes in her mother; Hyperlipidemia in her father and mother; Hypertension in her father and mother. There is no history of Breast cancer. ROS:   Please see the history of present illness.    All 14 point review of systems negative except as described per history of present illness  EKGs/Labs/Other Studies Reviewed:      Recent Labs: 09/15/2022: BUN 20; Creatinine, Ser 1.00; Hemoglobin 10.6; Platelets 185; Potassium 4.7; Sodium 138  Recent Lipid Panel    Component Value Date/Time   CHOL 206 (H) 11/27/2019 1553   TRIG 90 11/27/2019 1553   HDL 57 11/27/2019 1553   CHOLHDL 4.1 11/09/2018 1040   LDLCALC 133  (H)  11/27/2019 1553    Physical Exam:    VS:  BP 122/70 (BP Location: Left Arm, Patient Position: Sitting)   Pulse 70   Ht 4' 8.5" (1.435 m)   Wt 215 lb 12.8 oz (97.9 kg)   SpO2 95%   BMI 47.53 kg/m     Wt Readings from Last 3 Encounters:  03/02/23 215 lb 12.8 oz (97.9 kg)  09/14/22 191 lb (86.6 kg)  09/07/22 191 lb (86.6 kg)     GEN:  Well nourished, well developed in no acute distress HEENT: Normal NECK: No JVD; No carotid bruits LYMPHATICS: No lymphadenopathy CARDIAC: RRR, no murmurs, no rubs, no gallops RESPIRATORY:  Clear to auscultation without rales, wheezing or rhonchi  ABDOMEN: Soft, non-tender, non-distended MUSCULOSKELETAL:  No edema; No deformity  SKIN: Warm and dry LOWER EXTREMITIES: no swelling NEUROLOGIC:  Alert and oriented x 3 PSYCHIATRIC:  Normal affect   ASSESSMENT:    1. S/P atrial septal defect closure, surgical   2. Dyslipidemia   3. Infrarenal abdominal aortic aneurysm (AAA) without rupture (HCC)   4. Essential hypertension   5. Coronary artery disease involving native coronary artery of native heart without angina pectoris    PLAN:    In order of problems listed above:  Status post atrial septal defect closure surgically.  Doing well from that point review on appropriate medications asymptomatic. Dyslipidemia and is being followed by internal medicine team she does have only minimal coronary disease based on CAT scan before procedure her LDL is 106.  She is reluctant to go on medications.  Will continue monitoring. Ascending arctic aneurysm.  Continue monitoring When I see her most likely will repeat CT of the chest. Coronary artery disease without angina pectoris on appropriate medications which we will continue   Medication Adjustments/Labs and Tests Ordered: Current medicines are reviewed at length with the patient today.  Concerns regarding medicines are outlined above.  No orders of the defined types were placed in this  encounter.  Medication changes: No orders of the defined types were placed in this encounter.   Signed, Georgeanna Lea, MD, [facility] 03/02/2023 4:15 PM    [facility]

## 2023-03-02 NOTE — Addendum Note (Signed)
Addended by: Baldo Ash D on: 03/02/2023 04:27 PM   Modules accepted: Orders

## 2023-03-02 NOTE — Patient Instructions (Addendum)

## 2023-03-30 ENCOUNTER — Telehealth: Payer: Self-pay | Admitting: Orthopaedic Surgery

## 2023-03-30 NOTE — Telephone Encounter (Signed)
Called pt 1X and left vm for pt to call and set an appt with Dr Magnus Ivan or any PA or provider for follow up. PA Chestine Spore will not be in office

## 2023-04-07 ENCOUNTER — Ambulatory Visit: Payer: Medicare Other | Admitting: Physician Assistant

## 2023-04-26 ENCOUNTER — Encounter: Payer: Self-pay | Admitting: Physician Assistant

## 2023-04-26 ENCOUNTER — Other Ambulatory Visit (INDEPENDENT_AMBULATORY_CARE_PROVIDER_SITE_OTHER): Payer: Medicare Other

## 2023-04-26 ENCOUNTER — Ambulatory Visit (INDEPENDENT_AMBULATORY_CARE_PROVIDER_SITE_OTHER): Payer: Medicare Other | Admitting: Physician Assistant

## 2023-04-26 DIAGNOSIS — Z96642 Presence of left artificial hip joint: Secondary | ICD-10-CM | POA: Diagnosis not present

## 2023-04-26 DIAGNOSIS — M7062 Trochanteric bursitis, left hip: Secondary | ICD-10-CM

## 2023-04-26 MED ORDER — METHYLPREDNISOLONE ACETATE 40 MG/ML IJ SUSP
40.0000 mg | INTRAMUSCULAR | Status: AC | PRN
  Administered 2023-04-26: 40 mg via INTRA_ARTICULAR

## 2023-04-26 MED ORDER — LIDOCAINE HCL 1 % IJ SOLN
3.0000 mL | INTRAMUSCULAR | Status: AC | PRN
  Administered 2023-04-26: 3 mL

## 2023-04-26 NOTE — Progress Notes (Signed)
Office Visit Note   Patient: Alyssa Rosales           Date of Birth: 12-08-1950           MRN: [id number] Visit Date: 04/26/2023              Requested by: [name], MD [address] [place],  [place] [address] PCP: [name], MD   Assessment & Plan: Visit Diagnoses:  1. History of left hip replacement   2. Trochanteric bursitis, left hip     Plan: She is shown IT band stretching exercises.  Will see her back in just 2 weeks see how she is doing overall.  Questions were encouraged and answered at length.  Follow-Up Instructions: Return in about 2 weeks (around 05/10/2023).   Orders:  Orders Placed This Encounter  Procedures   Large Joint Inj: L greater trochanter   XR HIP UNILAT W OR W/O PELVIS 1V LEFT   No orders of the defined types were placed in this encounter.     Procedures: Large Joint Inj: L greater trochanter on 04/26/2023 4:56 PM Indications: pain Details: 22 G 1.5 in needle, lateral approach  Arthrogram: No  Medications: 3 mL lidocaine 1 %; 40 mg methylPREDNISolone acetate 40 MG/ML Outcome: tolerated well, no immediate complications Procedure, treatment alternatives, risks and benefits explained, specific risks discussed. Consent was given by the patient. Immediately prior to procedure a time out was called to verify the correct patient, procedure, equipment, support staff and site/side marked as required. Patient was prepped and draped in the usual sterile fashion.       Clinical Data: No additional findings.   Subjective: Chief Complaint  Patient presents with   Left Hip - Follow-up    HPI [name] comes in today for due to pain in the left hip region.  She states that she has cramping in her left thigh.  She also notes that she has pain in the lateral aspect of the hip.  History of left total hip arthroplasty 09/14/2022.  No new injury.  No groin pain.  Review of Systems See HPI  Objective: Vital Signs: There were no  vitals taken for this visit.  Physical Exam General: Well-developed well-nourished female no acute distress ambulates without any assistive device. Ortho Exam Left hip: Slightly limited internal/external rotation but fluid motion otherwise.  Tenderness over the left hip trochanteric region.  Surgical incisions healing well no signs of infection or wound dehiscence. Specialty Comments:  No specialty comments available.  Imaging: XR HIP UNILAT W OR W/O PELVIS 1V LEFT  Result Date: 04/26/2023 AP pelvis and lateral left hip: Left hip well located.  Status post left total hip arthroplasty well-seated components.  No acute fractures or acute findings.     PMFS History: Patient Active Problem List   Diagnosis Date Noted   Anemia 11/17/2022   Inflammatory pain 10/15/2022   Extensor tenosynovitis of wrist 10/15/2022   Status post total replacement of left hip 09/14/2022   Thrombocytopenia (HCC) 03/15/2022   Postoperative atrial fibrillation (HCC) 02/20/2022   History of atrial fibrillation 02/20/2022   Acute postoperative pain 02/16/2022   Postoperative anemia due to acute blood loss 02/16/2022   S/P atrial septal defect closure, surgical 02/16/2022   Atrial septal defect 09/14/2021   Preop cardiovascular exam 09/10/2021   Morbid obesity (HCC) 06/17/2020   Atrophic vaginitis 06/08/2019   Overactive thyroid gland 04/19/2019   Thinning hair 10/04/2018   Abdominal aortic aneurysm (AAA)  without rupture (HCC) 09/01/2017   History of thyroid nodule 04/21/2017   Coronary artery disease involving native coronary artery of native heart without angina pectoris 02/09/2017   Thoracic aortic aneurysm without rupture (HCC) 02/09/2017   Hyperlipidemia 01/27/2017   OSA (obstructive sleep apnea) 09/30/2016   RLS (restless legs syndrome) 09/30/2016   Moderate persistent asthma without complication 08/10/2016   Restrictive lung disease 08/10/2016   Lumbar pain with radiation down both legs 06/01/2016    Bilateral hip pain 06/01/2016   Dyslipidemia 02/16/2016   Dyspnea on exertion 02/16/2016   Precordial pain 02/16/2016   GERD without esophagitis 01/07/2016   Essential hypertension 01/07/2016   Morbid obesity with BMI of 45.0-49.9, adult (HCC) 01/07/2016   Malaise and fatigue 01/07/2016   Abnormal glucose 01/07/2016   Primary osteoarthritis involving multiple joints 01/07/2016   Past Medical History:  Diagnosis Date   Abdominal aortic aneurysm (AAA) without rupture (HCC) 09/01/2017   Abnormal glucose 01/07/2016   Last Assessment & Plan:   Formatting of this note might be different from the original.  Update her lab for this for her   Acute postoperative pain 02/16/2022   Anemia 11/17/2022   Arthritis    Atrophic vaginitis 06/08/2019   Coronary artery disease involving native coronary artery of native heart without angina pectoris 02/09/2017   Dyslipidemia 02/16/2016   Last Assessment & Plan:   Continue to follow this for her   Dyspnea on exertion 02/16/2016   Edema, lower extremity    Extensor tenosynovitis of wrist 10/15/2022   GERD without esophagitis 01/07/2016   Last Assessment & Plan:  Continue with the PPI and have refilled this for her   History of atrial fibrillation 02/20/2022   History of kidney problems    History of kidney stones    History of thyroid nodule 04/21/2017   Formatting of this note might be different from the original.  Last US revealed did not need biopsy   Hyperlipidemia 01/27/2017   Overview:  Added automatically from request for surgery [id number]   Hypertension    Inflammatory pain 10/15/2022   Joint pain    Kidney stone    Lumbar pain with radiation down both legs 06/01/2016   Last Assessment & Plan:   Formatting of this note might be different from the original.  She was unable to have this done due to feeling claustrophobic and she is going to have it redone when she follows up after her pulmonary Fu   Malaise and fatigue 01/07/2016   Last  Assessment & Plan:   Formatting of this note might be different from the original.  There is some improvement for her and she is pleased though hesitant with how she is doing that it is going to hold, and frustrated, she realizes she has had to change some of her lifestyle as well to accomodate this, she does not want to quit work though if she can avoid   Moderate persistent asthma without complication 08/10/2016   Morbid obesity (HCC) 06/17/2020   Morbid obesity with BMI of 45.0-49.9, adult (HCC) 01/07/2016   Last Assessment & Plan:   She and I discussed in depth that she has to work on her diet more than what she has been doing , she understands this    OSA (obstructive sleep apnea) 09/30/2016   Follows with pulmonary is seen 12/19 for FU bipap 21/15   Overactive thyroid gland 04/19/2019   Pneumonia    many years ago   Postoperative anemia due  to acute blood loss 02/16/2022   Precordial pain 02/16/2016   Overview:   Added automatically from request for surgery [id number]   Preop cardiovascular exam 09/10/2021   Primary osteoarthritis involving multiple joints 01/07/2016   Last Assessment & Plan:   Formatting of this note might be different from the original.  She has not been able to get the mri yet, even though it was "open" it was still difficult for her, would like to see how her PFT are first before give her something like valium to relax her for this test   Restrictive lung disease 08/10/2016   RLS (restless legs syndrome) 09/30/2016   S/P atrial septal defect closure, surgical 02/16/2022   Status post total replacement of left hip 09/14/2022   Thinning hair 10/04/2018   Thrombocytopenia (HCC) 03/15/2022   Vitamin D deficiency     Family History  Problem Relation Age of Onset   Alzheimer's disease Mother    Hyperlipidemia Mother    Hypertension Mother    Diabetes Mother    Hypertension Father    Hyperlipidemia Father    Breast cancer Neg Hx     Past Surgical History:   Procedure Laterality Date   AMPUTATION Right 10/21/2020   Procedure: AMPUTATION RIGHT MIDDLE FINGER;  Surgeon: Cindee Salt, MD;  Location: [facility];  Service: Orthopedics;  Laterality: Right;  IV REGIONAL FOREARM BLOCK, BIER BLOCK   ATRIAL SEPTAL DEFECT(ASD) CLOSURE  2023   COLONOSCOPY     CORONARY ANGIOPLASTY     INCISION AND DRAINAGE Right 11/14/2020   Procedure: INCISION AND DRAINAGE RIGHT MIDDLE FINGER;  Surgeon: Cindee Salt, MD;  Location: [facility];  Service: Orthopedics;  Laterality: Right;   kidney stone removal     RECTAL SURGERY     x2. both as a child   spur removal     TEE WITHOUT CARDIOVERSION N/A 11/26/2021   Procedure: TRANSESOPHAGEAL ECHOCARDIOGRAM (TEE);  Surgeon: Lewayne Bunting, MD;  Location: [facility];  Service: Cardiovascular;  Laterality: N/A;   TOOTH EXTRACTION     TOTAL HIP ARTHROPLASTY Left 09/14/2022   Procedure: LEFT TOTAL HIP ARTHROPLASTY ANTERIOR APPROACH;  Surgeon: Kathryne Hitch, MD;  Location: [facility];  Service: Orthopedics;  Laterality: Left;   TUBAL LIGATION  1984   Social History   Occupational History   Occupation: [employer]   Tobacco Use   Smoking status: Never   Smokeless tobacco: Never  Vaping Use   Vaping Use: Never used  Substance and Sexual Activity   Alcohol use: Never   Drug use: Never   Sexual activity: Not Currently    Birth control/protection: Post-menopausal

## 2023-05-11 ENCOUNTER — Encounter: Payer: Self-pay | Admitting: Cardiology

## 2023-05-11 NOTE — Telephone Encounter (Signed)
Error

## 2023-05-16 ENCOUNTER — Ambulatory Visit: Payer: Medicare Other | Admitting: Physician Assistant

## 2023-05-16 ENCOUNTER — Encounter: Payer: Self-pay | Admitting: Physician Assistant

## 2023-05-16 ENCOUNTER — Other Ambulatory Visit (INDEPENDENT_AMBULATORY_CARE_PROVIDER_SITE_OTHER): Payer: Medicare Other

## 2023-05-16 DIAGNOSIS — M79605 Pain in left leg: Secondary | ICD-10-CM

## 2023-05-16 DIAGNOSIS — M5416 Radiculopathy, lumbar region: Secondary | ICD-10-CM | POA: Diagnosis not present

## 2023-05-16 MED ORDER — METHYLPREDNISOLONE 4 MG PO TABS: ORAL_TABLET | ORAL | 0 refills | Status: DC

## 2023-05-16 NOTE — Progress Notes (Addendum)
HPI: Mrs. Wyszynski returns today 2 weeks status post left hip trochanteric injection she states this helped.  However since that time she has developed numbness tingling down the left leg.  She states she is still having pain in the left lateral thigh and down the leg.  No back pain.  She feels the methocarbamol helps.  She has had no injury.  She denies any bowel bladder dysfunction, waking pain, saddle anesthesia like symptoms or weight loss.  She is nondiabetic.  Status post Left total hip arthroplasty 09/14/2022.  Review of systems: See HPI otherwise negative  Physical exam: General well-developed well-nourished female in no acute distress.  Ambulates without any assistive device. Bilateral hips: Good range of motion bilateral hips.  Tenderness over the left hip trochanteric region. Lower extremities 5 out of 5 strength throughout lower extremities except for dorsiflexion of the left ankle against resistance which is 4 out of 5 and left great toe extension against resistance which is 4 out of 5.  Positive straight leg raise on the left negative on the right.  She has limited flexion extension lumbar spine.  No tenderness over the lumbar spine or paraspinous region.  Radiographs lumbar spine 2 views: No acute fractures.  Disc base overall well-maintained.  Lower lumbar facet joint arthritic changes.  Slight grade 1 L5-S1 anterior spinal listhesis.   Impression: Left lumbar radiculopathy  Plan: Will send her to formal physical therapy to work on range of motion strengthening, back exercises, home exercise program, and include modalities.  Will see her back in 4 weeks see what type or response she had to the Medrol which we placed her on today along with therapy.  She is to take no NSAIDs while on Medrol Dosepak.  Questions were encouraged and answered at length

## 2023-06-15 ENCOUNTER — Telehealth: Payer: Self-pay | Admitting: Cardiology

## 2023-06-15 MED ORDER — POTASSIUM CHLORIDE CRYS ER 20 MEQ PO TBCR: 20.0000 meq | EXTENDED_RELEASE_TABLET | Freq: Every day | ORAL | 1 refills | Status: DC

## 2023-06-15 NOTE — Telephone Encounter (Signed)
*  STAT* If patient is at the pharmacy, call can be transferred to refill team.   1. Which medications need to be refilled? (please list name of each medication and dose if known)  new prescriptions for Eliquis and Klor Clon- changing pharmacy   2. Would you like to learn more about the convenience, safety, & potential cost savings by using the [facility]?     3. Are you open to using the [facility] (Type [facility]   4. Which pharmacy/location (including street and city if local pharmacy) is medication to be sent to? [facility] on [address],[place]- [phone]   5. Do they need a 30 day or 90 day supply? 30 days # 60  and refills- for Eliquis and Klor-Con 90 days and refills

## 2023-06-16 ENCOUNTER — Encounter: Payer: Self-pay | Admitting: Physician Assistant

## 2023-06-16 ENCOUNTER — Ambulatory Visit (INDEPENDENT_AMBULATORY_CARE_PROVIDER_SITE_OTHER): Payer: Medicare Other | Admitting: Physician Assistant

## 2023-06-16 DIAGNOSIS — M205X2 Other deformities of toe(s) (acquired), left foot: Secondary | ICD-10-CM

## 2023-06-16 DIAGNOSIS — M5416 Radiculopathy, lumbar region: Secondary | ICD-10-CM

## 2023-06-16 MED ORDER — METHYLPREDNISOLONE 4 MG PO TABS: ORAL_TABLET | ORAL | 0 refills | Status: DC

## 2023-06-16 MED ORDER — TIZANIDINE HCL 2 MG PO TABS: 2.0000 mg | ORAL_TABLET | Freq: Every day | ORAL | 1 refills | Status: AC

## 2023-06-16 NOTE — Progress Notes (Signed)
HPI: Alyssa Rosales returns today for follow-up of her left hip pain and radicular pain down the left leg.  She has been going to physical therapy.  She is having pain that radiates from her lower buttocks to proximal posterior thigh.  She is no longer having pain on the lateral side.  She feels like she is pulled a muscle.  States her pain is changed.  She still notes she has problems with gait and balance and is afraid she might fall.  She has had no new injury.  She did not pick up the prednisone nor did she pick up the muscle relaxant that was prescribed.  She denies any fevers chills.  She does have a right second toe that she would like [place] to look at today.  She says that she has area on it that came up after having a pedicure.  Review of systems: See HPI otherwise negative or noncontributory.  Physical exam: General: Well-developed well-nourished female who is able to ambulate about the room without any assistive device. Left hip: Good range of motion without pain.  Nontender trochanteric region. Lumbar spine: Nontender over the lumbar spinal column.  Slight tenderness in the lower lumbar left paraspinous region.  Negative straight leg raise bilaterally.  Tight hamstrings bilaterally.  She is unable to touch her toes with forward flexion lumbar spine. Left foot second toe lateral border near the cuticle area there is abundant callus.  She has claw toes of all the lesser toes of the left foot.  There is no impending ulcers.  No rashes skin or lesions otherwise on the left foot.  Sensation subjectively intact throughout the foot.  Impression: Left hip trochanteric bursitis improved Left lumbar radicular pain Left second toe callus Left foot claw toes  Plan: Will have her continue physical therapy as this has been beneficial and she is no longer having the radicular symptoms down the leg.  Therapy will continue to work on gait balance hamstring stretching core strengthening.  Would like for her to  take Medrol Dosepak and muscle relaxant and these were both called in for today.  In regards to her second toe talked to her about getting a wider toebox shoe as she has a narrow toebox shoe at present.  Have her soak the foot daily and Dial soap soaks.  She can also use a pumice stone over the callused area.  Callus was pared down today and she tolerated this well there was no bleeding encountered.  Will see her here back in the office in 1 month sooner if there is any questions concerns.  Questions were encouraged and answered at length.  She may benefit from a left second toe flexor tenontotomy.

## 2023-06-20 ENCOUNTER — Other Ambulatory Visit: Payer: Self-pay | Admitting: Cardiology

## 2023-07-11 ENCOUNTER — Other Ambulatory Visit: Payer: Self-pay

## 2023-07-11 ENCOUNTER — Telehealth: Payer: Self-pay | Admitting: Cardiology

## 2023-07-11 DIAGNOSIS — I4891 Unspecified atrial fibrillation: Secondary | ICD-10-CM

## 2023-07-11 MED ORDER — APIXABAN 5 MG PO TABS: 5.0000 mg | ORAL_TABLET | Freq: Two times a day (BID) | ORAL | 1 refills | Status: DC

## 2023-07-11 NOTE — Telephone Encounter (Signed)
Prescription refill request for Eliquis received. Indication:afib Last office visit:5/24 Scr:1.06  4/24 Age: 72 Weight:97.9  kg  Prescription refilled

## 2023-07-11 NOTE — Telephone Encounter (Signed)
*  STAT* If patient is at the pharmacy, call can be transferred to refill team.   1. Which medications need to be refilled? (please list name of each medication and dose if known) apixaban (ELIQUIS) 5 MG TABS tablet   2. Which pharmacy/location (including street and city if local pharmacy) is medication to be sent to?  [facility] [phone] - [place], [place] - [address] OF [address]      3. Do they need a 30 day or 90 day supply? 90 day

## 2023-07-18 ENCOUNTER — Ambulatory Visit (INDEPENDENT_AMBULATORY_CARE_PROVIDER_SITE_OTHER): Payer: Medicare Other | Admitting: Physician Assistant

## 2023-07-18 DIAGNOSIS — Z96642 Presence of left artificial hip joint: Secondary | ICD-10-CM | POA: Diagnosis not present

## 2023-07-18 DIAGNOSIS — M5416 Radiculopathy, lumbar region: Secondary | ICD-10-CM

## 2023-07-18 DIAGNOSIS — Z7409 Other reduced mobility: Secondary | ICD-10-CM

## 2023-07-18 DIAGNOSIS — M79605 Pain in left leg: Secondary | ICD-10-CM | POA: Diagnosis not present

## 2023-07-18 NOTE — Progress Notes (Signed)
HPI: [name] returns today for follow-up of her low back pain and radicular symptoms down her leg..  She states she has some left buttocks pain but no radicular symptoms down the left leg now.  Main complaint now is cramping in her left leg.  She is on potassium due to hypokalemia.  She also states that she feels like she is off balance after sitting for any length of time.  Denies any dizziness or lightheadedness.  She is working with therapy on gait and balance.  Review of systems: See HPI otherwise  Physical exam: General Well-developed well-nourished female who ambulates without any assistive device. Bilateral hips: Good range of motion of both hips.  Tenderness over the left trochanteric region.  Leg lengths are equal.  Negative straight leg raise bilaterally.  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  Will see her back in 3 months with Dr. Magnus Rosales and see how she is doing overall.  AP pelvis lateral view of the left hip at that time.

## 2023-07-19 ENCOUNTER — Telehealth: Payer: Self-pay | Admitting: Cardiology

## 2023-07-19 NOTE — Telephone Encounter (Signed)
Patient needs clearance to get two teeth pulled, please call patient [phone].

## 2023-07-20 NOTE — Telephone Encounter (Signed)
LVM to call regarding message.

## 2023-07-21 ENCOUNTER — Encounter: Payer: Self-pay | Admitting: Neurology

## 2023-07-21 NOTE — Telephone Encounter (Signed)
LVM for pt to call regarding message left.

## 2023-08-02 NOTE — Progress Notes (Signed)
Initial neurology clinic note  Reason for Evaluation: Consultation requested by [name], MD for an opinion regarding muscle spasms and gait imbalance. My final recommendations will be communicated back to the requesting physician by way of shared medical record or letter to requesting physician via [place] mail.  HPI: This is Ms. Alyssa Rosales, a 72 y.o. right-handed female with a medical history of HTN, HLD, afib (on eliquis), CAD, atrial septal defect, nephrolithiasis, asthma, vit D deficiency, OA s/p left hip replacement in 09/2022 who presents to neurology clinic with the chief complaint of imbalance. The patient is alone today.  Patient had left hip replacement in 09/2022. Prior to the surgery, she was having hip pain and did have a fall. She was not having the kind of difficulty she is currently having at that time. After the surgery, she started having low back pain radiating into her left leg and cramping in the left leg. Her pain is intermittent. The back pain feels like someone is stabbing her. The leg pain is a spasm. The spasms can wake her up from sleep. Sitting does not cause pain. She will get up and walk and the pain will get worse and her back will "kill her." Sometimes leaning on something like a shopping cart will help. She states she has "no balance." She does not think her leg is weak. She denies numbness or tingling. She denies any difficulties in the right leg.  She will eat mustard that helps with cramps. She will also take vinegar tablets.   Patient is going to PT and has difficulty walking without holding on to things. She is continuing PT, but is almost done. She thinks this has helped. She has home exercise and has an exercise bike. She has difficulty bending over to get things.  Patient continues to work. 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  She wears stockings due to swelling in her legs.  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 She could get a colostomy but is not interested. This has been life long and has not changed recently. She denies saddle anesthesia.  She report any constitutional symptoms like fever, night sweats, anorexia or unintentional weight loss.  She denies any freezing or difficulty initiating movements.  EtOH use: No Caffeine use: No coffee, very little tea, very little soda, no energy drinks  Restrictive diet? Has to watch diet Family history of neuropathy/myopathy/neurologic disease? No   MEDICATIONS:  Outpatient Encounter Medications as of 08/10/2023  Medication Sig   acetaminophen (TYLENOL) 325 MG tablet Take 1-2 tablets (325-650 mg total) by mouth every 6 (six) hours as needed for mild pain (pain score 1-3 or temp > 100.5).   albuterol (VENTOLIN HFA) 108 (90 Base) MCG/ACT inhaler Inhale 2 puffs into the lungs every 6 (six) hours as needed for wheezing or shortness of breath.   apixaban (ELIQUIS) 5 MG TABS tablet Take 1 tablet (5 mg total) by mouth 2 (two) times daily.   APPLE CIDER VINEGAR PO Take 2 tablets by mouth daily.   aspirin EC 81 MG tablet Take 81 mg by mouth daily. Swallow whole.   budesonide-formoterol (SYMBICORT) 160-4.5 MCG/ACT inhaler Inhale 2 puffs into the lungs 2 (two) times daily as needed (shortness of breath).   Cholecalciferol (VITAMIN D) 125 MCG (5000 UT) CAPS Take 5,000 Units by mouth daily. With Vit K2   Collagen-Vitamin  C-Biotin (COLLAGEN 1500/C PO) Take 2 tablets by mouth daily.   cyanocobalamin 2000 MCG tablet Take 2,500 mcg by mouth daily.   fluticasone (FLONASE) 50 MCG/ACT nasal spray Place 1 spray into both nostrils daily.   loperamide (IMODIUM A-D) 2 MG tablet Take 2 mg by mouth 4 (four) times daily as needed for diarrhea or loose stools.   meclizine (ANTIVERT) 25 MG tablet Take 25 mg by mouth 3 (three) times daily as needed for dizziness.   metoprolol succinate (TOPROL-XL) 25 MG 24 hr tablet Take 25  mg by mouth daily.   niacin (VITAMIN B3) 500 MG tablet Take 500 mg by mouth at bedtime.   nitroGLYCERIN (NITROSTAT) 0.4 MG SL tablet Place 0.4 mg under the tongue every 5 (five) minutes as needed for chest pain.   ondansetron (ZOFRAN) 4 MG tablet Take 4 mg by mouth every 8 (eight) hours as needed for nausea or vomiting.   OVER THE COUNTER MEDICATION Take 1 tablet by mouth daily. Dynamic  Brain support   oxyCODONE (OXY IR/ROXICODONE) 5 MG immediate release tablet Take 5 mg by mouth 2 (two) times daily as needed.   pantoprazole (PROTONIX) 40 MG tablet Take 40 mg by mouth daily.   potassium chloride SA (KLOR-CON M20) 20 MEQ tablet Take 1 tablet (20 mEq total) by mouth daily.   pyridOXINE (VITAMIN B6) 100 MG tablet Take 100 mg by mouth daily.   torsemide (DEMADEX) 10 MG tablet Take 20 mg by mouth daily.   methylPREDNISolone (MEDROL) 4 MG tablet Take as directed (Patient not taking: Reported on 08/10/2023)   tiZANidine (ZANAFLEX) 2 MG tablet Take 1 tablet (2 mg total) by mouth at bedtime. (Patient not taking: Reported on 08/10/2023)   No facility-administered encounter medications on file as of 08/10/2023.    PAST MEDICAL HISTORY: Past Medical History:  Diagnosis Date   Abdominal aortic aneurysm (AAA) without rupture (HCC) 09/01/2017   Abnormal glucose 01/07/2016   Last Assessment & Plan:   Formatting of this note might be different from the original.  Update her lab for this for her   Acute postoperative pain 02/16/2022   Anemia 11/17/2022   Arthritis    Atrophic vaginitis 06/08/2019   Coronary artery disease involving native coronary artery of native heart without angina pectoris 02/09/2017   Dyslipidemia 02/16/2016   Last Assessment & Plan:   Continue to follow this for her   Dyspnea on exertion 02/16/2016   Edema, lower extremity    Extensor tenosynovitis of wrist 10/15/2022   GERD without esophagitis 01/07/2016   Last Assessment & Plan:  Continue with the PPI and have refilled this for  her   History of atrial fibrillation 02/20/2022   History of kidney problems    History of kidney stones    History of thyroid nodule 04/21/2017   Formatting of this note might be different from the original.  Last US revealed did not need biopsy   Hyperlipidemia 01/27/2017   Overview:  Added automatically from request for surgery [id number]   Hypertension    Inflammatory pain 10/15/2022   Joint pain    Kidney stone    Lumbar pain with radiation down both legs 06/01/2016   Last Assessment & Plan:   Formatting of this note might be different from the original.  She was unable to have this done due to feeling claustrophobic and she is going to have it redone when she follows up after her pulmonary Fu   Malaise and fatigue 01/07/2016   Last  Assessment & Plan:   Formatting of this note might be different from the original.  There is some improvement for her and she is pleased though hesitant with how she is doing that it is going to hold, and frustrated, she realizes she has had to change some of her lifestyle as well to accomodate this, she does not want to quit work though if she can avoid   Moderate persistent asthma without complication 08/10/2016   Morbid obesity (HCC) 06/17/2020   Morbid obesity with BMI of 45.0-49.9, adult (HCC) 01/07/2016   Last Assessment & Plan:   She and I discussed in depth that she has to work on her diet more than what she has been doing , she understands this    OSA (obstructive sleep apnea) 09/30/2016   Follows with pulmonary is seen 12/19 for FU bipap 21/15   Overactive thyroid gland 04/19/2019   Pneumonia    many years ago   Postoperative anemia due to acute blood loss 02/16/2022   Precordial pain 02/16/2016   Overview:   Added automatically from request for surgery [id number]   Preop cardiovascular exam 09/10/2021   Primary osteoarthritis involving multiple joints 01/07/2016   Last Assessment & Plan:   Formatting of this note might be different from the  original.  She has not been able to get the mri yet, even though it was "open" it was still difficult for her, would like to see how her PFT are first before give her something like valium to relax her for this test   Restrictive lung disease 08/10/2016   RLS (restless legs syndrome) 09/30/2016   S/P atrial septal defect closure, surgical 02/16/2022   Status post total replacement of left hip 09/14/2022   Thinning hair 10/04/2018   Thrombocytopenia (HCC) 03/15/2022   Vitamin D deficiency     PAST SURGICAL HISTORY: Past Surgical History:  Procedure Laterality Date   AMPUTATION Right 10/21/2020   Procedure: AMPUTATION RIGHT MIDDLE FINGER;  Surgeon: Cindee Salt, MD;  Location: [facility];  Service: Orthopedics;  Laterality: Right;  IV REGIONAL FOREARM BLOCK, BIER BLOCK   ATRIAL SEPTAL DEFECT(ASD) CLOSURE  2023   COLONOSCOPY     CORONARY ANGIOPLASTY     INCISION AND DRAINAGE Right 11/14/2020   Procedure: INCISION AND DRAINAGE RIGHT MIDDLE FINGER;  Surgeon: Cindee Salt, MD;  Location: [facility];  Service: Orthopedics;  Laterality: Right;   kidney stone removal     RECTAL SURGERY     x2. both as a child   spur removal     TEE WITHOUT CARDIOVERSION N/A 11/26/2021   Procedure: TRANSESOPHAGEAL ECHOCARDIOGRAM (TEE);  Surgeon: Lewayne Bunting, MD;  Location: [facility];  Service: Cardiovascular;  Laterality: N/A;   TOOTH EXTRACTION     TOTAL HIP ARTHROPLASTY Left 09/14/2022   Procedure: LEFT TOTAL HIP ARTHROPLASTY ANTERIOR APPROACH;  Surgeon: Kathryne Hitch, MD;  Location: [facility];  Service: Orthopedics;  Laterality: Left;   TUBAL LIGATION  1984    ALLERGIES: Allergies  Allergen Reactions   Amoxicillin-Pot Clavulanate Diarrhea and Nausea And Vomiting   Penicillins Diarrhea   Propoxyphene Rash and Other (See Comments)    violent  Violent 01/25/22 [facility] allergy list brought with patient, patient unsure of exact reaction   Sulfa  Antibiotics Rash    01/25/22 Patient unsure of exact reaction   Sulfamethoxazole Rash    FAMILY HISTORY: Family History  Problem Relation Age of Onset   Alzheimer's disease Mother  Hyperlipidemia Mother    Hypertension Mother    Diabetes Mother    Hypertension Father    Hyperlipidemia Father    Breast cancer Neg Hx     SOCIAL HISTORY: Social History   Tobacco Use   Smoking status: Never   Smokeless tobacco: Never  Vaping Use   Vaping status: Never Used  Substance Use Topics   Alcohol use: Not Currently   Drug use: Never   Social History   Social History Narrative   Are you right handed or left handed? Right   Are you currently employed ?    What is your current occupation? [employer]   Do you live at home alone?   Who lives with you? friend   What type of home do you live in: 1 story or 2 story? one   Caffeine none      OBJECTIVE: PHYSICAL EXAM: BP 123/79   Pulse 69   Ht 4' 8.5" (1.435 m)   Wt 215 lb (97.5 kg)   SpO2 99%   BMI 47.35 kg/m   General: General appearance: Awake and alert. No distress. Cooperative with exam.  Skin: No obvious rash or jaundice. HEENT: Atraumatic. Anicteric. Lungs: Non-labored breathing on room air  Extremities: Mild peripheral edema (right leg > left). Psych: Affect appropriate.  Neurological: Mental Status: Alert. Speech fluent. No pseudobulbar affect Cranial Nerves: CNII: No RAPD. Visual fields grossly intact. CNIII, IV, VI: PERRL. No nystagmus. EOMI. CN V: Facial sensation intact bilaterally to fine touch. CN VII: Facial muscles symmetric and strong. No ptosis at rest. CN VIII: Hearing grossly intact bilaterally. CN IX: No hypophonia. CN X: Palate elevates symmetrically. CN XI: Full strength shoulder shrug bilaterally. CN XII: Tongue protrusion full and midline. No atrophy or fasciculations. No significant dysarthria Motor: Tone is increased in all extremities (paratonia?). No significant  atrophy.  Individual muscle group testing (MRC grade out of 5):  Movement     Neck flexion 5    Neck extension 5     Right Left   Shoulder abduction 4+ 4+   Shoulder adduction 5 5   Shoulder ext rotation 4 4   Shoulder int rotation 5 5   Elbow flexion 5 5   Elbow extension 5 5   Finger abduction - FDI 5 5   Finger abduction - ADM 5 5   Finger extension 5 5   Finger distal flexion - 2/3 5 5    Finger distal flexion - 4/5 5 5    Thumb flexion - FPL 5- 5-   Thumb abduction - APB 5- 5-    Hip flexion 5 5-   Hip extension 5 5   Hip adduction 5 5   Hip abduction 5 5   Knee extension 5 5   Knee flexion 5 5   Dorsiflexion 5 5   Plantarflexion 5 5   Inversion 5 5   Eversion 5 5   Great toe extension 5 5   Great toe flexion 5 5     Reflexes:  Right Left   Bicep 2+ 2+   Tricep 2+ 2+   BrRad 2+ 2+   Knee 2+ 2+   Ankle Trace Trace    Pathological Reflexes: Babinski: mute response bilaterally Sensation: Pinprick: Intact in all extremities Vibration: Intact in all extremities Proprioception: Intact in right great toe, diminished in left great toe Coordination: Intact finger-to- nose-finger bilaterally. Romberg negative. Gait: Able to rise from chair with arms crossed unassisted. Normal, narrow-based gait.  Lab  and Test Review: External labs: 03/10/23: TSH: low at 0.244 Free T4 wnl Total F3 wnl  02/17/23: CMP wnl CBC w/ diff significant for Hb 11.1, MCV 91.5 HbA1c: 6.0  11/17/22: Lipid panel: LDL 106, TG 152 Iron studies significant for ferritin of 20  Imaging: Lumbar spine xray (05/16/23): No acute fractures.  Disc base overall well-maintained.  Lower lumbar  facet joint arthritic changes.  Slight grade 1 L5-S1 anterior spinal  listhesis.   MRI lumbar spine wo contrast (12/18/2018): FINDINGS: Segmentation: 5 non rib-bearing lumbar type vertebral bodies are present. The lowest fully formed vertebral body is L5.   Alignment: Slight degenerative retrolisthesis is  present at L1-2. AP alignment is otherwise anatomic.   Vertebrae: Mild endplate degenerative changes are present with Schmorl's nodes.   Conus medullaris and cauda equina: Conus extends to the L1 level. Conus and cauda equina appear normal.   Paraspinal and other soft tissues: Limited imaging the abdomen is unremarkable. There is no significant adenopathy. No solid organ lesions are present.   Disc levels:   L1-2: Mild disc bulging is present. Facet hypertrophy is worse on the right. No significant stenosis is present.   L2-3: Moderate facet hypertrophy is present bilaterally. There is some disc bulging without significant stenosis.   L3-4: A broad-based disc protrusion is asymmetric to the left. A central annular tear is present. The disc extends into the left neural foramen. Mild left foraminal narrowing is present.   L4-5: Advanced facet hypertrophy is present. There is no significant stenosis. Central canal is patent.   L5-S1: Advanced facet hypertrophy is worse on the left. No significant stenosis is present.   IMPRESSION: 1. Mild left foraminal narrowing at L3-4 secondary to a far left lateral disc protrusion. 2. Multilevel facet hypertrophy as described. 3. No other focal stenosis evident.  ASSESSMENT: Kianna Billet Gravely is a 72 y.o. female who presents for evaluation of back pain, left leg pain/spasms, and gait imbalance. She has a relevant medical history of HTN, HLD, afib (on eliquis), CAD, atrial septal defect, nephrolithiasis, asthma, vit D deficiency, OA s/p left hip replacement in 09/2022. Her neurological examination is pertinent for proximal muscle weakness in upper extremities, increased tone, and diminished sensation in LLE. Available diagnostic data is significant for MRI lumbar spine from 2020 showing disc protrusion to the left which was only causing mild left neural foraminal stenosis at that time. One possible cause of back pain and left leg pain/spasms  would be worsening lumbar radiculopathy. Her upper extremity weakness and increased tone (vs inability to relax) and imbalance could also implicate the cervical spine. I will work up as below.  PLAN: -Blood work: B1, B12, folate, vit E, copper, ionized Ca, vit D, PTH, TSH -MRI cervical and lumbar spine wo contrast -Gave OTC recommendations for muscle spasms including magnesium oxide -Continue PT and home exercises  -Return to clinic in 3 months  The impression above as well as the plan as outlined below were extensively discussed with the patient who voiced understanding. All questions were answered to their satisfaction.  The patient was counseled on pertinent fall precautions per the printed material provided today, and as noted under the "Patient Instructions" section below.  When available, results of the above investigations and possible further recommendations will be communicated to the patient via telephone/MyChart. Patient to call office if not contacted after expected testing turnaround time.   Total time spent reviewing records, interview, history/exam, documentation, and coordination of care on day of encounter:  60 min  Thank you for allowing me to participate in patient's care.  If I can answer any additional questions, I would be pleased to do so.  Jacquelyne Balint, MD   CC: [name], MD [address] [place] [address]  CC: Referring provider: [name], MD [address] [place],  [place] [address]

## 2023-08-10 ENCOUNTER — Ambulatory Visit: Payer: Medicare Other | Admitting: Neurology

## 2023-08-10 ENCOUNTER — Other Ambulatory Visit (INDEPENDENT_AMBULATORY_CARE_PROVIDER_SITE_OTHER): Payer: Medicare Other

## 2023-08-10 ENCOUNTER — Encounter: Payer: Self-pay | Admitting: Neurology

## 2023-08-10 VITALS — BP 123/79 | HR 69 | Ht <= 58 in | Wt 215.0 lb

## 2023-08-10 DIAGNOSIS — M6289 Other specified disorders of muscle: Secondary | ICD-10-CM

## 2023-08-10 DIAGNOSIS — R252 Cramp and spasm: Secondary | ICD-10-CM | POA: Diagnosis not present

## 2023-08-10 DIAGNOSIS — M549 Dorsalgia, unspecified: Secondary | ICD-10-CM

## 2023-08-10 DIAGNOSIS — M5442 Lumbago with sciatica, left side: Secondary | ICD-10-CM

## 2023-08-10 DIAGNOSIS — M79605 Pain in left leg: Secondary | ICD-10-CM

## 2023-08-10 DIAGNOSIS — R269 Unspecified abnormalities of gait and mobility: Secondary | ICD-10-CM | POA: Diagnosis not present

## 2023-08-10 DIAGNOSIS — Z96642 Presence of left artificial hip joint: Secondary | ICD-10-CM | POA: Diagnosis not present

## 2023-08-10 DIAGNOSIS — R2689 Other abnormalities of gait and mobility: Secondary | ICD-10-CM

## 2023-08-10 DIAGNOSIS — G8929 Other chronic pain: Secondary | ICD-10-CM

## 2023-08-10 LAB — FOLATE: Folate: 21.1 ng/mL (ref >5.9)

## 2023-08-10 LAB — TSH: TSH: 0.26 u[IU]/mL — ABNORMAL LOW (ref 0.35–5.50)

## 2023-08-10 LAB — VITAMIN B12: Vitamin B-12: >1501 pg/mL — ABNORMAL HIGH (ref 211–911)

## 2023-08-10 NOTE — Patient Instructions (Addendum)
I saw you today for back pain, left leg pain, left leg spasms, and imbalance. I wonder if this is due to a pinched nerve, maybe in your neck and your low back.  I would like to investigate further with the following: -Blood work today -MRI of your neck and low back (cervical and lumbar spine)  I will be in touch when I have your results.  Continue physical therapy for as long as they will allow and continue the home exercises they give you.  Recommend the following measures that may provide some symptomatic benefit for muscle cramps: - Adequate oral clear fluid intake to maintain optimal hydration (about 2.5 liters, or around 8-10 glasses per day) Avoidance of caffeine Trial of DIET tonic water: About 1 glass, up to 6 times daily Magnesium oxide up to 400 mg by mouth twice daily, as needed (over the counter) Gentle muscle stretching routine, especially before bedtime   I want to see you back in about 3 months. Please let me know if you have any questions or concerns in the meantime.   The physicians and staff at [facility] are committed to providing excellent care. You may receive a survey requesting feedback about your experience at our office. We strive to receive "very good" responses to the survey questions. If you feel that your experience would prevent you from giving the office a "very good " response, please contact our office to try to remedy the situation. We may be reached at [phone]. Thank you for taking the time out of your busy day to complete the survey.  Jacquelyne Balint, MD [facility]  Preventing Falls at [facility] are common, often dreaded events in the lives of older people. Aside from the obvious injuries and even death that may result, fall can cause wide-ranging consequences including loss of independence, mental decline, decreased activity and mobility. Younger people are also at risk of falling, especially those with chronic illnesses and  fatigue.  Ways to reduce risk for falling Examine diet and medications. Warm foods and alcohol dilate blood vessels, which can lead to dizziness when standing. Sleep aids, antidepressants and pain medications can also increase the likelihood of a fall.  Get a vision exam. Poor vision, cataracts and glaucoma increase the chances of falling.  Check foot gear. Shoes should fit snugly and have a sturdy, nonskid sole and a broad, low heel  Participate in a physician-approved exercise program to build and maintain muscle strength and improve balance and coordination. Programs that use ankle weights or stretch bands are excellent for muscle-strengthening. Water aerobics programs and low-impact Tai Chi programs have also been shown to improve balance and coordination.  Increase vitamin D intake. Vitamin D improves muscle strength and increases the amount of calcium the body is able to absorb and deposit in bones.  How to prevent falls from common hazards Floors - Remove all loose wires, cords, and throw rugs. Minimize clutter. Make sure rugs are anchored and smooth. Keep furniture in its usual place.  Chairs -- Use chairs with straight backs, armrests and firm seats. Add firm cushions to existing pieces to add height.  Bathroom - Install grab bars and non-skid tape in the tub or shower. Use a bathtub transfer bench or a shower chair with a back support Use an elevated toilet seat and/or safety rails to assist standing from a low surface. Do not use towel racks or bathroom tissue holders to help you stand.  Lighting - Make sure halls, stairways,  and entrances are well-lit. Install a night light in your bathroom or hallway. Make sure there is a light switch at the top and bottom of the staircase. Turn lights on if you get up in the middle of the night. Make sure lamps or light switches are within reach of the bed if you have to get up during the night.  Kitchen - Install non-skid rubber mats near the  sink and stove. Clean spills immediately. Store frequently used utensils, pots, pans between waist and eye level. This helps prevent reaching and bending. Sit when getting things out of lower cupboards.  Living room/ Bedrooms - Place furniture with wide spaces in between, giving enough room to move around. Establish a route through the living room that gives you something to hold onto as you walk.  Stairs - Make sure treads, rails, and rugs are secure. Install a rail on both sides of the stairs. If stairs are a threat, it might be helpful to arrange most of your activities on the lower level to reduce the number of times you must climb the stairs.  Entrances and doorways - Install metal handles on the walls adjacent to the doorknobs of all doors to make it more secure as you travel through the doorway.  Tips for maintaining balance Keep at least one hand free at all times. Try using a backpack or fanny pack to hold things rather than carrying them in your hands. Never carry objects in both hands when walking as this interferes with keeping your balance.  Attempt to swing both arms from front to back while walking. This might require a conscious effort if Parkinson's disease has diminished your movement. It will, however, help you to maintain balance and posture, and reduce fatigue.  Consciously lift your feet off of the ground when walking. Shuffling and dragging of the feet is a common culprit in losing your balance.  When trying to navigate turns, use a "U" technique of facing forward and making a wide turn, rather than pivoting sharply.  Try to stand with your feet shoulder-length apart. When your feet are close together for any length of time, you increase your risk of losing your balance and falling.  Do one thing at a time. Don't try to walk and accomplish another task, such as reading or looking around. The decrease in your automatic reflexes complicates motor function, so the less  distraction, the better.  Do not wear rubber or gripping soled shoes, they might "catch" on the floor and cause tripping.  Move slowly when changing positions. Use deliberate, concentrated movements and, if needed, use a grab bar or walking aid. Count 15 seconds between each movement. For example, when rising from a seated position, wait 15 seconds after standing to begin walking.  If balance is a continuous problem, you might want to consider a walking aid such as a cane, walking stick, or walker. Once you've mastered walking with help, you might be ready to try it on your own again.

## 2023-08-17 LAB — PARATHYROID HORMONE, INTACT (NO CA): PTH: 162 pg/mL — ABNORMAL HIGH (ref 16–77)

## 2023-08-17 LAB — COPPER, SERUM: Copper: 140 ug/dL (ref 70–175)

## 2023-08-17 LAB — VITAMIN E
Gamma-Tocopherol (Vit E): 2.5 mg/L (ref <4.4)
Vitamin E (Alpha Tocopherol): 13.2 mg/L (ref 5.7–19.9)

## 2023-08-17 LAB — VITAMIN B1: Vitamin B1 (Thiamine): 17 nmol/L (ref 8–30)

## 2023-08-17 LAB — CALCIUM, IONIZED: Calcium, Ion: 5 mg/dL (ref 4.7–5.5)

## 2023-08-22 ENCOUNTER — Other Ambulatory Visit: Payer: Self-pay | Admitting: Neurology

## 2023-08-22 DIAGNOSIS — F40240 Claustrophobia: Secondary | ICD-10-CM

## 2023-08-22 MED ORDER — LORAZEPAM 1 MG PO TABS
ORAL_TABLET | ORAL | 0 refills | Status: AC
Start: 2023-08-22 — End: ?

## 2023-09-02 ENCOUNTER — Ambulatory Visit: Payer: Medicare Other | Attending: Cardiology

## 2023-09-02 DIAGNOSIS — E213 Hyperparathyroidism, unspecified: Secondary | ICD-10-CM | POA: Insufficient documentation

## 2023-09-02 DIAGNOSIS — Z9889 Other specified postprocedural states: Secondary | ICD-10-CM | POA: Diagnosis present

## 2023-09-02 DIAGNOSIS — I6789 Other cerebrovascular disease: Secondary | ICD-10-CM | POA: Diagnosis present

## 2023-09-02 HISTORY — DX: Hyperparathyroidism, unspecified: E21.3

## 2023-09-02 LAB — ECHOCARDIOGRAM COMPLETE
AR max vel: 1.68 cm2
AV Area VTI: 1.92 cm2
AV Area mean vel: 1.7 cm2
AV Mean grad: 6 mm[Hg]
AV Peak grad: 11.2 mm[Hg]
Ao pk vel: 1.67 m/s
Area-P 1/2: 2.91 cm2
MV M vel: 3.08 m/s
MV Peak grad: 37.9 mm[Hg]
S' Lateral: 3.1 cm

## 2023-09-08 ENCOUNTER — Ambulatory Visit
Admission: RE | Admit: 2023-09-08 | Discharge: 2023-09-08 | Disposition: A | Discharge status: Home / Self Care | Payer: Medicare Other | Source: Ambulatory Visit | Attending: Neurology | Admitting: Neurology

## 2023-09-08 DIAGNOSIS — G8929 Other chronic pain: Secondary | ICD-10-CM

## 2023-09-08 DIAGNOSIS — M6289 Other specified disorders of muscle: Secondary | ICD-10-CM

## 2023-09-08 DIAGNOSIS — Z96642 Presence of left artificial hip joint: Secondary | ICD-10-CM

## 2023-09-08 DIAGNOSIS — R2689 Other abnormalities of gait and mobility: Secondary | ICD-10-CM

## 2023-09-08 DIAGNOSIS — R252 Cramp and spasm: Secondary | ICD-10-CM

## 2023-09-08 DIAGNOSIS — R269 Unspecified abnormalities of gait and mobility: Secondary | ICD-10-CM

## 2023-09-08 DIAGNOSIS — M79605 Pain in left leg: Secondary | ICD-10-CM

## 2023-09-14 ENCOUNTER — Telehealth: Payer: Self-pay

## 2023-09-14 NOTE — Telephone Encounter (Signed)
-----   Message from [name] sent at 09/09/2023  8:21 AM EST ----- Echocardiogram show preserved left ventricle ejection fraction normal pulm artery pressure, left atrial size mildly dilated, mild mitral valve regurgitation, overall looks good

## 2023-09-14 NOTE — Telephone Encounter (Signed)
LM to return my call. 

## 2023-09-15 ENCOUNTER — Telehealth: Payer: Self-pay

## 2023-09-15 NOTE — Telephone Encounter (Signed)
Unable to reach or LM, Mailed a letter requesting a call back 

## 2023-09-15 NOTE — Telephone Encounter (Signed)
-----   Message from [name] sent at 09/09/2023  8:21 AM EST ----- Echocardiogram show preserved left ventricle ejection fraction normal pulm artery pressure, left atrial size mildly dilated, mild mitral valve regurgitation, overall looks good

## 2023-09-22 ENCOUNTER — Other Ambulatory Visit: Payer: Self-pay | Admitting: Cardiology

## 2023-09-22 NOTE — Telephone Encounter (Signed)
Patient is returning call and is requesting call back. Requesting results be left on VM if no answer.

## 2023-09-23 NOTE — Telephone Encounter (Signed)
Spoke with pt regarding normal Echo results per Dr. Vanetta Shawl note. Pt verbalized understanding and had no further questions.

## 2023-09-28 ENCOUNTER — Encounter: Payer: Self-pay | Admitting: Neurology

## 2023-10-03 ENCOUNTER — Ambulatory Visit: Payer: Medicare Other | Admitting: Orthopaedic Surgery

## 2023-10-05 ENCOUNTER — Other Ambulatory Visit: Payer: Self-pay | Admitting: Cardiology

## 2023-10-05 ENCOUNTER — Ambulatory Visit: Payer: Medicare Other | Attending: Cardiology | Admitting: Cardiology

## 2023-10-05 ENCOUNTER — Encounter: Payer: Self-pay | Admitting: Cardiology

## 2023-10-05 VITALS — BP 140/84 | HR 63 | Ht <= 58 in | Wt 213.0 lb

## 2023-10-05 DIAGNOSIS — I7121 Aneurysm of the ascending aorta, without rupture: Secondary | ICD-10-CM | POA: Insufficient documentation

## 2023-10-05 DIAGNOSIS — I2510 Atherosclerotic heart disease of native coronary artery without angina pectoris: Secondary | ICD-10-CM | POA: Diagnosis present

## 2023-10-05 DIAGNOSIS — Z9889 Other specified postprocedural states: Secondary | ICD-10-CM | POA: Diagnosis present

## 2023-10-05 DIAGNOSIS — I4891 Unspecified atrial fibrillation: Secondary | ICD-10-CM

## 2023-10-05 NOTE — Patient Instructions (Signed)

## 2023-10-05 NOTE — Progress Notes (Unsigned)
Cardiology Office Note:    Date:  10/05/2023   ID:  HIBO FETTE, DOB 1951-10-08, MRN [id number]  PCP:  Alyssa Rosales., MD  Cardiologist:  Alyssa Balsam, MD    Referring MD: Alyssa Rosales., MD   Chief Complaint  Patient presents with   Follow-up    History of Present Illness:    Alyssa Rosales is a 72 y.o. female  with past medical history significant for ASD repair in April of2023 at [facility], essential hypertension coronary disease but only limited based on cardiac catheterization, ascending aortic aneurysm measuring 43 mm, episode of atrial fibrillation that she had done after surgery with amiodarone being discontinued on May 16.  Since have seen her last time she got hip surgery and recovered quite nicely doing well she is very happy because she is back at work.  Comes today to months for follow-up she is doing great.  She denies of any chest pain tightness squeezing pressure burning chest.  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  Past Medical History:  Diagnosis Date   Abdominal aortic aneurysm (AAA) without rupture (HCC) 09/01/2017   Abnormal glucose 01/07/2016   Last Assessment & Plan:   Formatting of this note might be different from the original.  Update her lab for this for her   Acute postoperative pain 02/16/2022   Anemia 11/17/2022   Arthritis    Atrophic vaginitis 06/08/2019   Coronary artery disease involving native coronary artery of native heart without angina pectoris 02/09/2017   Dyslipidemia 02/16/2016   Last Assessment & Plan:   Continue to follow this for her   Dyspnea on exertion 02/16/2016   Edema, lower extremity    Extensor tenosynovitis of wrist 10/15/2022   GERD without esophagitis 01/07/2016   Last Assessment & Plan:  Continue with the PPI and have refilled this for her   History of atrial fibrillation 02/20/2022   History of kidney problems    History of kidney stones    History of thyroid nodule 04/21/2017   Formatting  of this note might be different from the original.  Last US revealed did not need biopsy   Hyperlipidemia 01/27/2017   Overview:  Added automatically from request for surgery [id number]   Hypertension    Inflammatory pain 10/15/2022   Joint pain    Kidney stone    Lumbar pain with radiation down both legs 06/01/2016   Last Assessment & Plan:   Formatting of this note might be different from the original.  She was unable to have this done due to feeling claustrophobic and she is going to have it redone when she follows up after her pulmonary Fu   Malaise and fatigue 01/07/2016   Last Assessment & Plan:   Formatting of this note might be different from the original.  There is some improvement for her and she is pleased though hesitant with how she is doing that it is going to hold, and frustrated, she realizes she has had to change some of her lifestyle as well to accomodate this, she does not want to quit work though if she can avoid   Moderate persistent asthma without complication 08/10/2016   Morbid obesity (HCC) 06/17/2020   Morbid obesity with BMI of 45.0-49.9, adult (HCC) 01/07/2016   Last Assessment & Plan:   She and I discussed in depth that she has to work on her diet more than what she has been doing , she understands this  OSA (obstructive sleep apnea) 09/30/2016   Follows with pulmonary is seen 12/19 for FU bipap 21/15   Overactive thyroid gland 04/19/2019   Pneumonia    many years ago   Postoperative anemia due to acute blood loss 02/16/2022   Precordial pain 02/16/2016   Overview:   Added automatically from request for surgery [id number]   Preop cardiovascular exam 09/10/2021   Primary osteoarthritis involving multiple joints 01/07/2016   Last Assessment & Plan:   Formatting of this note might be different from the original.  She has not been able to get the mri yet, even though it was "open" it was still difficult for her, would like to see how her PFT are first before give her  something like valium to relax her for this test   Restrictive lung disease 08/10/2016   RLS (restless legs syndrome) 09/30/2016   S/P atrial septal defect closure, surgical 02/16/2022   Status post total replacement of left hip 09/14/2022   Thinning hair 10/04/2018   Thrombocytopenia (HCC) 03/15/2022   Vitamin D deficiency     Past Surgical History:  Procedure Laterality Date   AMPUTATION Right 10/21/2020   Procedure: AMPUTATION RIGHT MIDDLE FINGER;  Surgeon: Cindee Salt, MD;  Location: [facility];  Service: Orthopedics;  Laterality: Right;  IV REGIONAL FOREARM BLOCK, BIER BLOCK   ATRIAL SEPTAL DEFECT(ASD) CLOSURE  2023   COLONOSCOPY     CORONARY ANGIOPLASTY     INCISION AND DRAINAGE Right 11/14/2020   Procedure: INCISION AND DRAINAGE RIGHT MIDDLE FINGER;  Surgeon: Cindee Salt, MD;  Location:  SURGERY CENTER;  Service: Orthopedics;  Laterality: Right;   kidney stone removal     RECTAL SURGERY     x2. both as a child   spur removal     TEE WITHOUT CARDIOVERSION N/A 11/26/2021   Procedure: TRANSESOPHAGEAL ECHOCARDIOGRAM (TEE);  Surgeon: Lewayne Bunting, MD;  Location: [facility];  Service: Cardiovascular;  Laterality: N/A;   TOOTH EXTRACTION     TOTAL HIP ARTHROPLASTY Left 09/14/2022   Procedure: LEFT TOTAL HIP ARTHROPLASTY ANTERIOR APPROACH;  Surgeon: Kathryne Hitch, MD;  Location: [facility] OR;  Service: Orthopedics;  Laterality: Left;   TUBAL LIGATION  1984    Current Medications: Current Meds  Medication Sig   acetaminophen (TYLENOL) 325 MG tablet Take 1-2 tablets (325-650 mg total) by mouth every 6 (six) hours as needed for mild pain (pain score 1-3 or temp > 100.5).   albuterol (VENTOLIN HFA) 108 (90 Base) MCG/ACT inhaler Inhale 2 puffs into the lungs every 6 (six) hours as needed for wheezing or shortness of breath.   apixaban (ELIQUIS) 5 MG TABS tablet Take 1 tablet (5 mg total) by mouth 2 (two) times daily.   APPLE CIDER VINEGAR PO Take 2  tablets by mouth daily.   aspirin EC 81 MG tablet Take 81 mg by mouth daily. Swallow whole.   budesonide-formoterol (SYMBICORT) 160-4.5 MCG/ACT inhaler Inhale 2 puffs into the lungs 2 (two) times daily as needed (shortness of breath).   Cholecalciferol (VITAMIN D) 125 MCG (5000 UT) CAPS Take 5,000 Units by mouth daily. With Vit K2   Collagen-Vitamin C-Biotin (COLLAGEN 1500/C PO) Take 2 tablets by mouth daily.   cyanocobalamin 2000 MCG tablet Take 2,500 mcg by mouth daily.   fluticasone (FLONASE) 50 MCG/ACT nasal spray Place 1 spray into both nostrils daily.   loperamide (IMODIUM A-D) 2 MG tablet Take 2 mg by mouth 4 (four) times daily as needed for diarrhea or  loose stools.   LORazepam (ATIVAN) 1 MG tablet Take 1 tablet (1 mg) prior to leaving for MRI, may repeat dose if needed prior to starting MRI.   meclizine (ANTIVERT) 25 MG tablet Take 25 mg by mouth 3 (three) times daily as needed for dizziness.   methylPREDNISolone (MEDROL) 4 MG tablet Take as directed   metoprolol succinate (TOPROL-XL) 25 MG 24 hr tablet Take 25 mg by mouth daily.   niacin (VITAMIN B3) 500 MG tablet Take 500 mg by mouth at bedtime.   nitroGLYCERIN (NITROSTAT) 0.4 MG SL tablet Place 0.4 mg under the tongue every 5 (five) minutes as needed for chest pain.   ondansetron (ZOFRAN) 4 MG tablet Take 4 mg by mouth every 8 (eight) hours as needed for nausea or vomiting.   OVER THE COUNTER MEDICATION Take 1 tablet by mouth daily. Dynamic  Brain support   oxyCODONE (OXY IR/ROXICODONE) 5 MG immediate release tablet Take 5 mg by mouth 2 (two) times daily as needed.   pantoprazole (PROTONIX) 40 MG tablet Take 40 mg by mouth daily.   potassium chloride SA (KLOR-CON M) 20 MEQ tablet TAKE 1 TABLET(20 MEQ) BY MOUTH DAILY   pyridOXINE (VITAMIN B6) 100 MG tablet Take 100 mg by mouth daily.   tiZANidine (ZANAFLEX) 2 MG tablet Take 1 tablet (2 mg total) by mouth at bedtime.   torsemide (DEMADEX) 10 MG tablet Take 20 mg by mouth daily.      Allergies:   Amoxicillin-pot clavulanate, Penicillins, Propoxyphene, Sulfa antibiotics, and Sulfamethoxazole   Social History   Socioeconomic History   Marital status: Divorced    Spouse name: Not on file   Number of children: Not on file   Years of education: Not on file   Highest education level: Not on file  Occupational History   Occupation: [employer]   Tobacco Use   Smoking status: Never   Smokeless tobacco: Never  Vaping Use   Vaping status: Never Used  Substance and Sexual Activity   Alcohol use: Not Currently   Drug use: Never   Sexual activity: Not Currently    Birth control/protection: Post-menopausal  Other Topics Concern   Not on file  Social History Narrative   Are you right handed or left handed? Right   Are you currently employed ?    What is your current occupation? [employer]   Do you live at home alone?   Who lives with you? friend   What type of home do you live in: 1 story or 2 story? one   Caffeine none    Social Determinants of Health   Financial Resource Strain: Not on file  Food Insecurity: Low Risk  (08/15/2023)   Received from [facility]   Hunger Vital Sign    Worried About Running Out of Food in the Last Year: Never true    Ran Out of Food in the Last Year: Never true  Transportation Needs: No Transportation Needs (08/15/2023)   Received from [facility]    In the past 12 months, has lack of reliable transportation kept you from medical appointments, meetings, work or from getting things needed for daily living? : No  Physical Activity: Not on file  Stress: Not on file  Social Connections: Not on file     Family History: The patient's family history includes Alzheimer's disease in her mother; Diabetes in her mother; Hyperlipidemia in her father and mother; Hypertension in her father and mother. There is no history of  Breast cancer. ROS:   Please see the history of present illness.    All 14 point review  of systems negative except as described per history of present illness  EKGs/Labs/Other Studies Reviewed:         Recent Labs: 08/10/2023: TSH 0.26  Recent Lipid Panel    Component Value Date/Time   CHOL 206 (H) 11/27/2019 1553   TRIG 90 11/27/2019 1553   HDL 57 11/27/2019 1553   CHOLHDL 4.1 11/09/2018 1040   LDLCALC 133 (H) 11/27/2019 1553    Physical Exam:    VS:  BP (!) 160/91 (BP Location: Right Arm, Patient Position: Sitting, Cuff Size: Large)   Pulse 63   Ht 4' 8.5" (1.435 m)   Wt 213 lb (96.6 kg)   SpO2 99%   BMI 46.91 kg/m     Wt Readings from Last 3 Encounters:  10/05/23 213 lb (96.6 kg)  08/10/23 215 lb (97.5 kg)  03/02/23 215 lb 12.8 oz (97.9 kg)     GEN:  Well nourished, well developed in no acute distress HEENT: Normal NECK: No JVD; No carotid bruits LYMPHATICS: No lymphadenopathy CARDIAC: RRR, no murmurs, no rubs, no gallops RESPIRATORY:  Clear to auscultation without rales, wheezing or rhonchi  ABDOMEN: Soft, non-tender, non-distended MUSCULOSKELETAL:  No edema; No deformity  SKIN: Warm and dry LOWER EXTREMITIES: no swelling NEUROLOGIC:  Alert and oriented x 3 PSYCHIATRIC:  Normal affect   ASSESSMENT:    1. S/P atrial septal defect closure, surgical   2. Coronary artery disease involving native coronary artery of native heart without angina pectoris   3. Aneurysm of ascending aorta without rupture (HCC)    PLAN:    In order of problems listed above:  History of atrial septal defect status postsurgical repair.  Doing well from that point to recover completely after surgery stable back at work. Coronary disease only minimal based on cardiac catheterization done before atrial septal defect repair. Ascending aorta aneurysm, size unchanged continue monitoring. Paroxysmal atrial fibrillation continue anticoagulation   Medication Adjustments/Labs and Tests Ordered: Current medicines are reviewed at length with the patient today.  Concerns  regarding medicines are outlined above.  No orders of the defined types were placed in this encounter.  Medication changes: No orders of the defined types were placed in this encounter.   Signed, Georgeanna Lea, MD, [facility] 10/05/2023 3:41 PM    [facility]

## 2023-10-06 NOTE — Telephone Encounter (Signed)
Prescription refill request for Eliquis received. Indication: AF Last office visit: 10/05/23  Kandyce Rud MD Scr: 1.09 on 08/15/23  Epic Age: 72 Weight: 96.6kg  Based on above findings Eliquis 5mg  twice daily is the appropriate dose.  Refill approved.

## 2023-10-17 ENCOUNTER — Other Ambulatory Visit (INDEPENDENT_AMBULATORY_CARE_PROVIDER_SITE_OTHER): Payer: Medicare Other

## 2023-10-17 ENCOUNTER — Ambulatory Visit (INDEPENDENT_AMBULATORY_CARE_PROVIDER_SITE_OTHER): Payer: Medicare Other | Admitting: Physician Assistant

## 2023-10-17 ENCOUNTER — Encounter: Payer: Self-pay | Admitting: Physician Assistant

## 2023-10-17 DIAGNOSIS — Z96642 Presence of left artificial hip joint: Secondary | ICD-10-CM

## 2023-10-17 NOTE — Progress Notes (Signed)
HPI: Alyssa Rosales comes in today status post left total hip arthroplasty 09/14/2022.  States her hip is doing well her main complaint is the fact that she continues to have cramping in her left leg.  She is seeing neurology.  She does feel that her gait and balance is improved.  Review of systems: See HPI otherwise negative  Physical exam: Left hip great range of motion without pain.  Left calf supple nontender dorsiflexion plantarflexion left ankle intact.  Radiographs:AP pelvis lateral view of the left hip: Left hip is well seated.  No acute fracture or acute findings.  Bilateral hips well located.  Right hip joints well-preserved.  Impression: Status post left total hip arthroplasty  Plan: At this point in time she can follow-up with [place] as needed.  Questions were encouraged and answered.  In regards to her cramping she can follow-up with neurology also discussed with her if discussing her cramps with her primary care physician.

## 2023-12-09 ENCOUNTER — Ambulatory Visit: Payer: Medicare Other | Admitting: Neurology

## 2023-12-21 NOTE — Progress Notes (Signed)
 I saw Alyssa Rosales in neurology clinic on 12/28/23 in follow up for back pain, left leg pain/spasms, and imbalance.  HPI: Alyssa Rosales is a 73 y.o. year old female with a history of HTN, HLD, afib (on eliquis), CAD, atrial septal defect, nephrolithiasis, asthma, vit D deficiency, OA s/p left hip replacement in 09/2022 who we last saw on 08/10/23.  To briefly review: Patient had left hip replacement in 09/2022. Prior to the surgery, she was having hip pain and did have a fall. She was not having the kind of difficulty she is currently having at that time. After the surgery, she started having low back pain radiating into her left leg and cramping in the left leg. Her pain is intermittent. The back pain feels like someone is stabbing her. The leg pain is a spasm. The spasms can wake her up from sleep. Sitting does not cause pain. She will get up and walk and the pain will get worse and her back will "kill her." Sometimes leaning on something like a shopping cart will help. She states she has "no balance." She does not think her leg is weak. She denies numbness or tingling. She denies any difficulties in the right leg.   She will eat mustard that helps with cramps. She will also take vinegar tablets.    Patient is going to PT and has difficulty walking without holding on to things. She is continuing PT, but is almost done. She thinks this has helped. She has home exercise and has an exercise bike. She has difficulty bending over to get things.   Patient continues to work. 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   She wears stockings due to swelling in her legs.   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 She could get a colostomy but is not interested. This has been life long and has not changed recently. She denies saddle anesthesia.   She report any constitutional symptoms like fever, night sweats,  anorexia or unintentional weight loss.   She denies any freezing or difficulty initiating movements.   EtOH use: No Caffeine use: No coffee, very little tea, very little soda, no energy drinks  Restrictive diet? Has to watch diet Family history of neuropathy/myopathy/neurologic disease? No  Most recent Assessment and Plan (08/10/23): Alyssa Rosales is a 73 y.o. female who presents for evaluation of back pain, left leg pain/spasms, and gait imbalance. She has a relevant medical history of HTN, HLD, afib (on eliquis), CAD, atrial septal defect, nephrolithiasis, asthma, vit D deficiency, OA s/p left hip replacement in 09/2022. Her neurological examination is pertinent for proximal muscle weakness in upper extremities, increased tone, and diminished sensation in LLE. Available diagnostic data is significant for MRI lumbar spine from 2020 showing disc protrusion to the left which was only causing mild left neural foraminal stenosis at that time. One possible cause of back pain and left leg pain/spasms would be worsening lumbar radiculopathy. Her upper extremity weakness and increased tone (vs inability to relax) and imbalance could also implicate the cervical spine. I will work up as below.   PLAN: -Blood work: B1, B12, folate, vit E, copper, ionized Ca, vit D, PTH, TSH -MRI cervical and lumbar spine wo contrast -Gave OTC recommendations for muscle spasms including magnesium oxide -Continue PT and home exercises  Since their last visit: Labs were significant for elevated  PTH (162) and low TSH (0.26). I recommended she discuss with her PCP. She is to see endocrinology but cannot get in until 04/2024.  MRI cervical and lumbar spine did not show any significant canal or foraminal stenosis but did show facet arthropathy.   Per ortho note from 10/17/23, patient was still having cramping in her left leg but gait and balance had improved. She agrees it has improved, but still has occasional bad leg  cramps. She gets relief with mustard.  She continues to work. She walks at work an hour prior to working and during her breaks.   MEDICATIONS:  Outpatient Encounter Medications as of 12/28/2023  Medication Sig   acetaminophen (TYLENOL) 325 MG tablet Take 1-2 tablets (325-650 mg total) by mouth every 6 (six) hours as needed for mild pain (pain score 1-3 or temp > 100.5).   albuterol (VENTOLIN HFA) 108 (90 Base) MCG/ACT inhaler Inhale 2 puffs into the lungs every 6 (six) hours as needed for wheezing or shortness of breath.   APPLE CIDER VINEGAR PO Take 2 tablets by mouth daily.   aspirin EC 81 MG tablet Take 81 mg by mouth daily. Swallow whole.   budesonide-formoterol (SYMBICORT) 160-4.5 MCG/ACT inhaler Inhale 2 puffs into the lungs 2 (two) times daily as needed (shortness of breath).   Cholecalciferol (VITAMIN D) 125 MCG (5000 UT) CAPS Take 5,000 Units by mouth daily. With Vit K2   Collagen-Vitamin C-Biotin (COLLAGEN 1500/C PO) Take 2 tablets by mouth daily.   cyanocobalamin 2000 MCG tablet Take 2,500 mcg by mouth daily.   ELIQUIS 5 MG TABS tablet TAKE 1 TABLET(5 MG) BY MOUTH TWICE DAILY   fluticasone (FLONASE) 50 MCG/ACT nasal spray Place 1 spray into both nostrils daily.   loperamide (IMODIUM A-D) 2 MG tablet Take 2 mg by mouth 4 (four) times daily as needed for diarrhea or loose stools.   meclizine (ANTIVERT) 25 MG tablet Take 25 mg by mouth 3 (three) times daily as needed for dizziness.   metoprolol succinate (TOPROL-XL) 25 MG 24 hr tablet Take 25 mg by mouth daily.   niacin (VITAMIN B3) 500 MG tablet Take 500 mg by mouth at bedtime.   nitroGLYCERIN (NITROSTAT) 0.4 MG SL tablet Place 0.4 mg under the tongue every 5 (five) minutes as needed for chest pain.   ondansetron (ZOFRAN) 4 MG tablet Take 4 mg by mouth every 8 (eight) hours as needed for nausea or vomiting.   OVER THE COUNTER MEDICATION Take 1 tablet by mouth daily. Dynamic  Brain support   oxyCODONE (OXY IR/ROXICODONE) 5 MG  immediate release tablet Take 5 mg by mouth 2 (two) times daily as needed.   pantoprazole (PROTONIX) 40 MG tablet Take 40 mg by mouth daily.   potassium chloride SA (KLOR-CON M) 20 MEQ tablet TAKE 1 TABLET(20 MEQ) BY MOUTH DAILY   pyridOXINE (VITAMIN B6) 100 MG tablet Take 100 mg by mouth daily.   torsemide (DEMADEX) 10 MG tablet Take 20 mg by mouth daily.   LORazepam (ATIVAN) 1 MG tablet Take 1 tablet (1 mg) prior to leaving for MRI, may repeat dose if needed prior to starting MRI. (Patient not taking: Reported on 12/28/2023)   methylPREDNISolone (MEDROL) 4 MG tablet Take as directed (Patient not taking: Reported on 12/28/2023)   tiZANidine (ZANAFLEX) 2 MG tablet Take 1 tablet (2 mg total) by mouth at bedtime. (Patient not taking: Reported on 12/28/2023)   No facility-administered encounter medications on file as of 12/28/2023.    PAST MEDICAL HISTORY:  Past Medical History:  Diagnosis Date   Abdominal aortic aneurysm (AAA) without rupture (HCC) 09/01/2017   Abnormal glucose 01/07/2016   Last Assessment & Plan:   Formatting of this note might be different from the original.  Update her lab for this for her   Acute postoperative pain 02/16/2022   Anemia 11/17/2022   Arthritis    Atrophic vaginitis 06/08/2019   Coronary artery disease involving native coronary artery of native heart without angina pectoris 02/09/2017   Dyslipidemia 02/16/2016   Last Assessment & Plan:   Continue to follow this for her   Dyspnea on exertion 02/16/2016   Edema, lower extremity    Extensor tenosynovitis of wrist 10/15/2022   GERD without esophagitis 01/07/2016   Last Assessment & Plan:  Continue with the PPI and have refilled this for her   History of atrial fibrillation 02/20/2022   History of kidney problems    History of kidney stones    History of thyroid nodule 04/21/2017   Formatting of this note might be different from the original.  Last US revealed did not need biopsy   Hyperlipidemia 01/27/2017    Overview:  Added automatically from request for surgery [id number]   Hypertension    Inflammatory pain 10/15/2022   Joint pain    Kidney stone    Lumbar pain with radiation down both legs 06/01/2016   Last Assessment & Plan:   Formatting of this note might be different from the original.  She was unable to have this done due to feeling claustrophobic and she is going to have it redone when she follows up after her pulmonary Fu   Malaise and fatigue 01/07/2016   Last Assessment & Plan:   Formatting of this note might be different from the original.  There is some improvement for her and she is pleased though hesitant with how she is doing that it is going to hold, and frustrated, she realizes she has had to change some of her lifestyle as well to accomodate this, she does not want to quit work though if she can avoid   Moderate persistent asthma without complication 08/10/2016   Morbid obesity (HCC) 06/17/2020   Morbid obesity with BMI of 45.0-49.9, adult (HCC) 01/07/2016   Last Assessment & Plan:   She and I discussed in depth that she has to work on her diet more than what she has been doing , she understands this    OSA (obstructive sleep apnea) 09/30/2016   Follows with pulmonary is seen 12/19 for FU bipap 21/15   Overactive thyroid gland 04/19/2019   Pneumonia    many years ago   Postoperative anemia due to acute blood loss 02/16/2022   Precordial pain 02/16/2016   Overview:   Added automatically from request for surgery [id number]   Preop cardiovascular exam 09/10/2021   Primary osteoarthritis involving multiple joints 01/07/2016   Last Assessment & Plan:   Formatting of this note might be different from the original.  She has not been able to get the mri yet, even though it was "open" it was still difficult for her, would like to see how her PFT are first before give her something like valium to relax her for this test   Restrictive lung disease 08/10/2016   RLS (restless legs syndrome)  09/30/2016   S/P atrial septal defect closure, surgical 02/16/2022   Status post total replacement of left hip 09/14/2022   Thinning hair 10/04/2018   Thrombocytopenia (HCC) 03/15/2022   Vitamin  D deficiency     PAST SURGICAL HISTORY: Past Surgical History:  Procedure Laterality Date   AMPUTATION Right 10/21/2020   Procedure: AMPUTATION RIGHT MIDDLE FINGER;  Surgeon: Cindee Salt, MD;  Location: [facility];  Service: Orthopedics;  Laterality: Right;  IV REGIONAL FOREARM BLOCK, BIER BLOCK   ATRIAL SEPTAL DEFECT(ASD) CLOSURE  2023   COLONOSCOPY     CORONARY ANGIOPLASTY     INCISION AND DRAINAGE Right 11/14/2020   Procedure: INCISION AND DRAINAGE RIGHT MIDDLE FINGER;  Surgeon: Cindee Salt, MD;  Location: [facility];  Service: Orthopedics;  Laterality: Right;   kidney stone removal     RECTAL SURGERY     x2. both as a child   spur removal     TEE WITHOUT CARDIOVERSION N/A 11/26/2021   Procedure: TRANSESOPHAGEAL ECHOCARDIOGRAM (TEE);  Surgeon: Lewayne Bunting, MD;  Location: [facility] ENDOSCOPY;  Service: Cardiovascular;  Laterality: N/A;   TOOTH EXTRACTION     TOTAL HIP ARTHROPLASTY Left 09/14/2022   Procedure: LEFT TOTAL HIP ARTHROPLASTY ANTERIOR APPROACH;  Surgeon: Kathryne Hitch, MD;  Location: [facility] OR;  Service: Orthopedics;  Laterality: Left;   TUBAL LIGATION  1984    ALLERGIES: Allergies  Allergen Reactions   Amoxicillin-Pot Clavulanate Diarrhea and Nausea And Vomiting   Penicillins Diarrhea   Propoxyphene Rash and Other (See Comments)    violent  Violent 01/25/22 Per [facility] allergy list brought with patient, patient unsure of exact reaction   Sulfa Antibiotics Rash    01/25/22 Patient unsure of exact reaction   Sulfamethoxazole Rash    FAMILY HISTORY: Family History  Problem Relation Age of Onset   Alzheimer's disease Mother    Hyperlipidemia Mother    Hypertension Mother    Diabetes Mother    Hypertension Father     Hyperlipidemia Father    Breast cancer Neg Hx     SOCIAL HISTORY: Social History   Tobacco Use   Smoking status: Never   Smokeless tobacco: Never  Vaping Use   Vaping status: Never Used  Substance Use Topics   Alcohol use: Not Currently   Drug use: Never   Social History   Social History Narrative   Are you right handed or left handed? Right   Are you currently employed ?    What is your current occupation? [employer]   Do you live at home alone?   Who lives with you? friend   What type of home do you live in: 1 story or 2 story? one   Caffeine none     Objective:  Vital Signs:  BP 122/74   Pulse (!) 57   Ht 4' 8.5" (1.435 m)   Wt 207 lb (93.9 kg)   SpO2 94%   BMI 45.59 kg/m   General: General appearance: Awake and alert. No distress. Cooperative with exam.  Skin: No obvious rash or jaundice. HEENT: Atraumatic. Anicteric. Lungs: Non-labored breathing on room air   Neurological: Mental Status: Alert. Speech fluent. No pseudobulbar affect Cranial Nerves: CNII: No RAPD. Visual fields intact. CNIII, IV, VI: PERRL. No nystagmus. EOMI. CN V: Facial sensation intact bilaterally to fine touch. CN VII: Facial muscles symmetric and strong. No ptosis at rest. CN VIII: Hears finger rub well bilaterally. CN IX: No hypophonia. CN X: Palate elevates symmetrically. CN XI: Full strength shoulder shrug bilaterally. CN XII: Tongue protrusion full and midline. No atrophy or fasciculations. No significant dysarthria Motor: Tone is normal  Individual muscle group testing (MRC  grade out of 5):  Movement     Neck flexion 5    Neck extension 5     Right Left   Shoulder abduction 4+ 4+   Elbow flexion 5 5   Elbow extension 5 5   Finger abduction - FDI 5 5   Finger abduction - ADM 5 5   Finger extension 5 5   Finger distal flexion - 2/3 5 5    Finger distal flexion - 4/5 5 5    Thumb flexion - FPL 5 5   Thumb abduction - APB 5 5    Hip flexion 5 5   Hip  extension 5 5   Hip adduction 5 5   Hip abduction 5 5   Knee extension 5 5   Knee flexion 5 5   Dorsiflexion 5 5   Plantarflexion 5 5     Reflexes:  Right Left  Bicep 2+ 2+  Tricep 2+ 2+  BrRad 2+ 2+  Knee 2+ 2+  Ankle 0 0   Sensation: Intact to light touch Coordination: Intact finger-to- nose-finger and heel-to-shin bilaterally. Gait: Able to rise from chair with arms crossed unassisted. Narrow based gait.   Lab and Test Review: New results: 12/27/23 external labs: TSH: 0.251 HbA1c: 5.8  08/15/23 external labs: HbA1c: 6.1 Lipid panel: tChol 188, LDL 113, TG 197 TSH: 0.408 (mildly low) B12: > 1500 Vit D wnl PTH 121  08/10/23: B1 wnl Folate wnl Vit E wnl Copper wnl Ionized Ca wnl  MRI cervical and lumbar spine wo contrast (09/08/23): MRI CERVICAL SPINE FINDINGS   Alignment: Reversal of the usual cervical lordosis without focal angulation or significant listhesis.   Vertebrae: No acute or suspicious osseous findings. Multilevel facet arthropathy. 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 Recommend plain film correlation. The left C4-5 facet joint appears ankylosed.   Cord: Normal in signal and caliber.   Posterior Fossa, vertebral arteries, paraspinal tissues: Visualized portions of the posterior fossa appear unremarkable.Bilateral vertebral artery flow voids. The dominant left vertebral artery. No paraspinal abnormalities are identified. The internal carotid arteries are tortuous and medially displaced.   Disc levels:   C2-3: As above, suspected incomplete segmentation at this level without spinal stenosis or foraminal narrowing.   C3-4: Preserved disc height with bilateral facet hypertrophy, greater on the right. No spinal stenosis or nerve root encroachment.   C4-5: Mild disc bulging and uncinate spurring. Ankylosis of the left facet joint. No spinal stenosis or right foraminal narrowing.  Mild left foraminal narrowing.   C5-6: Spondylosis with loss of disc height and posterior osteophytes covering diffusely bulging disc material. Moderate facet hypertrophy, worse on the right. Borderline spinal stenosis without cord deformity. Mild to moderate foraminal narrowing bilaterally.   C6-7: Spondylosis with loss of disc height and posterior osteophytes covering diffusely bulging disc material. There is asymmetric uncinate spurring on the left and mild bilateral facet hypertrophy. No significant central spinal stenosis or right foraminal narrowing. Moderate left foraminal narrowing.   C7-T1: Mild facet hypertrophy. No significant spinal stenosis or nerve root encroachment.   MRI LUMBAR SPINE FINDINGS   Segmentation: Conventional anatomy assumed, with the last open disc space designated L5-S1.Concordant with prior imaging.   Alignment:  Physiologic.   Vertebrae: No worrisome osseous lesion, acute fracture or pars defect. Mild sacroiliac degenerative changes bilaterally.   Conus medullaris: Extends to the L1 level and appears normal.   Paraspinal and other soft tissues: No significant paraspinal  findings.   Disc levels:   Sagittal images demonstrate multilevel disc bulging in the lower thoracic spine without evidence of significant spinal stenosis or foraminal narrowing.   L1-2: Mildly progressive loss of disc height with annular disc bulging, endplate osteophytes and mild facet hypertrophy. No significant spinal stenosis or nerve root encroachment. Mild right foraminal narrowing.   L2-3: Mildly progressive loss of disc height with mild annular disc bulging. Moderate facet and ligamentous hypertrophy. Resulting mild spinal stenosis without nerve root encroachment. Both foramina are sufficiently patent.   L3-4: Mildly progressive loss of disc height with annular disc bulging eccentric to the left. Moderate facet and ligamentous hypertrophy. Mild spinal stenosis  with mild left foraminal narrowing, similar to previous study.   L4-5: Unchanged mild disc bulging with moderate to severe bilateral facet hypertrophy. No significant spinal stenosis or foraminal narrowing.   L5-S1: Preserved disc height and hydration. Unchanged asymmetric left-sided facet hypertrophy. No significant spinal stenosis or nerve root encroachment.   IMPRESSION: 1. No acute findings or clear explanation for the patient's symptoms in the cervical or lumbar spine. 2. 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 Recommend plain film correlation. 3. Multilevel cervical spondylosis with disc bulging, uncinate spurring and facet hypertrophy as described. There is borderline spinal stenosis at C5-6 and mild to moderate foraminal narrowing bilaterally at C5-6. 4. Moderate left foraminal narrowing at C6-7. 5. Mildly progressive multilevel lumbar spondylosis with disc bulging and facet hypertrophy as described. Mild spinal stenosis at L2-3 and L3-4 with chronic left foraminal narrowing at L3-4. No high-grade spinal stenosis or definite nerve root encroachment.  Previously reviewed results: External labs: 03/10/23: TSH: low at 0.244 Free T4 wnl Total F3 wnl   02/17/23: CMP wnl CBC w/ diff significant for Hb 11.1, MCV 91.5 HbA1c: 6.0   11/17/22: Iron studies significant for ferritin of 20   MRI lumbar spine wo contrast (12/18/2018): FINDINGS: Segmentation: 5 non rib-bearing lumbar type vertebral bodies are present. The lowest fully formed vertebral body is L5.   Alignment: Slight degenerative retrolisthesis is present at L1-2. AP alignment is otherwise anatomic.   Vertebrae: Mild endplate degenerative changes are present with Schmorl's nodes.   Conus medullaris and cauda equina: Conus extends to the L1 level. Conus and cauda equina appear normal.   Paraspinal and other soft tissues: Limited imaging the abdomen is unremarkable. There is no significant  adenopathy. No solid organ lesions are present.   Disc levels:   L1-2: Mild disc bulging is present. Facet hypertrophy is worse on the right. No significant stenosis is present.   L2-3: Moderate facet hypertrophy is present bilaterally. There is some disc bulging without significant stenosis.   L3-4: A broad-based disc protrusion is asymmetric to the left. A central annular tear is present. The disc extends into the left neural foramen. Mild left foraminal narrowing is present.   L4-5: Advanced facet hypertrophy is present. There is no significant stenosis. Central canal is patent.   L5-S1: Advanced facet hypertrophy is worse on the left. No significant stenosis is present.   IMPRESSION: 1. Mild left foraminal narrowing at L3-4 secondary to a far left lateral disc protrusion. 2. Multilevel facet hypertrophy as described. 3. No other focal stenosis evident.  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 MRI cervical and lumbar spine showed no high grade stenosis. She has improved slightly and responds well to a spoon of mustard for her cramps. She is overall doing well and continues to  be very active.  Plan: -Can continue OTC recommendations or mustard as needed for cramps -Follow up with endocrinology as planned  Return to clinic as needed   Jacquelyne Balint, MD

## 2023-12-28 ENCOUNTER — Encounter: Payer: Self-pay | Admitting: Neurology

## 2023-12-28 ENCOUNTER — Ambulatory Visit (INDEPENDENT_AMBULATORY_CARE_PROVIDER_SITE_OTHER): Payer: Medicare Other | Admitting: Neurology

## 2023-12-28 VITALS — BP 122/74 | HR 57 | Ht <= 58 in | Wt 207.0 lb

## 2023-12-28 DIAGNOSIS — R2689 Other abnormalities of gait and mobility: Secondary | ICD-10-CM

## 2023-12-28 DIAGNOSIS — M79605 Pain in left leg: Secondary | ICD-10-CM

## 2023-12-28 DIAGNOSIS — R269 Unspecified abnormalities of gait and mobility: Secondary | ICD-10-CM | POA: Diagnosis not present

## 2023-12-28 DIAGNOSIS — R252 Cramp and spasm: Secondary | ICD-10-CM | POA: Diagnosis not present

## 2023-12-28 NOTE — Patient Instructions (Signed)
 -   Recommend the following measures that may provide some symptomatic benefit for muscle twitching and/or cramps: - Adequate oral clear fluid intake to maintain optimal hydration (about 2.5 liters, or around 8-10 glasses per day) Avoidance of caffeine Trial of DIET tonic water: About 1 glass, up to 6 times daily Magnesium oxide up to 400 mg by mouth twice daily, as needed (over the counter) Spoon of mustard Gentle muscle stretching routine, especially before bedtime

## 2024-04-05 ENCOUNTER — Other Ambulatory Visit: Payer: Self-pay | Admitting: Internal Medicine

## 2024-04-05 DIAGNOSIS — Z1231 Encounter for screening mammogram for malignant neoplasm of breast: Secondary | ICD-10-CM

## 2024-06-04 ENCOUNTER — Ambulatory Visit
Admission: RE | Admit: 2024-06-04 | Discharge: 2024-06-04 | Disposition: A | Discharge status: Home / Self Care | Source: Ambulatory Visit | Attending: Internal Medicine | Admitting: Internal Medicine

## 2024-06-04 DIAGNOSIS — Z1231 Encounter for screening mammogram for malignant neoplasm of breast: Secondary | ICD-10-CM

## 2024-07-04 ENCOUNTER — Other Ambulatory Visit: Payer: Self-pay | Admitting: Cardiology

## 2024-07-04 DIAGNOSIS — I4891 Unspecified atrial fibrillation: Secondary | ICD-10-CM

## 2024-07-04 NOTE — Telephone Encounter (Signed)
 Prescription refill request for Eliquis  received. Indication:asd Last office visit:12/24 Scr:1.20  6/25 Age: 73 Weight:93.9  kg  Prescription refilled

## 2024-07-05 DIAGNOSIS — J189 Pneumonia, unspecified organism: Secondary | ICD-10-CM | POA: Insufficient documentation

## 2024-07-05 DIAGNOSIS — Z87448 Personal history of other diseases of urinary system: Secondary | ICD-10-CM | POA: Insufficient documentation

## 2024-07-05 DIAGNOSIS — M2550 Pain in unspecified joint: Secondary | ICD-10-CM | POA: Insufficient documentation

## 2024-07-05 DIAGNOSIS — I10 Essential (primary) hypertension: Secondary | ICD-10-CM | POA: Insufficient documentation

## 2024-07-05 DIAGNOSIS — E559 Vitamin D deficiency, unspecified: Secondary | ICD-10-CM | POA: Insufficient documentation

## 2024-07-05 DIAGNOSIS — N200 Calculus of kidney: Secondary | ICD-10-CM | POA: Insufficient documentation

## 2024-07-05 DIAGNOSIS — Z87442 Personal history of urinary calculi: Secondary | ICD-10-CM | POA: Insufficient documentation

## 2024-07-05 DIAGNOSIS — R600 Localized edema: Secondary | ICD-10-CM | POA: Insufficient documentation

## 2024-07-05 DIAGNOSIS — M1990 Unspecified osteoarthritis, unspecified site: Secondary | ICD-10-CM | POA: Insufficient documentation

## 2024-07-06 ENCOUNTER — Ambulatory Visit: Attending: Cardiology | Admitting: Cardiology

## 2024-07-06 ENCOUNTER — Encounter: Payer: Self-pay | Admitting: Cardiology

## 2024-07-06 VITALS — BP 136/80 | HR 63 | Ht <= 58 in | Wt 200.0 lb

## 2024-07-06 DIAGNOSIS — Z9889 Other specified postprocedural states: Secondary | ICD-10-CM | POA: Insufficient documentation

## 2024-07-06 DIAGNOSIS — I7143 Infrarenal abdominal aortic aneurysm, without rupture: Secondary | ICD-10-CM | POA: Diagnosis present

## 2024-07-06 DIAGNOSIS — I10 Essential (primary) hypertension: Secondary | ICD-10-CM | POA: Diagnosis present

## 2024-07-06 DIAGNOSIS — E785 Hyperlipidemia, unspecified: Secondary | ICD-10-CM | POA: Diagnosis present

## 2024-07-06 DIAGNOSIS — I4891 Unspecified atrial fibrillation: Secondary | ICD-10-CM | POA: Insufficient documentation

## 2024-07-06 DIAGNOSIS — I7121 Aneurysm of the ascending aorta, without rupture: Secondary | ICD-10-CM | POA: Diagnosis present

## 2024-07-06 DIAGNOSIS — I9789 Other postprocedural complications and disorders of the circulatory system, not elsewhere classified: Secondary | ICD-10-CM | POA: Diagnosis present

## 2024-07-06 DIAGNOSIS — R0609 Other forms of dyspnea: Secondary | ICD-10-CM | POA: Diagnosis present

## 2024-07-06 NOTE — Progress Notes (Signed)
 Cardiology Office Note:    Date:  07/06/2024   ID:  Alyssa Rosales, DOB 31-May-1951, MRN [id number]  PCP:  Alyssa Cathlyn LABOR., MD  Cardiologist:  Alyssa Fitch, MD    Referring MD: Alyssa Cathlyn LABOR., MD   No chief complaint on file.   History of Present Illness:    Alyssa Rosales is a 73 y.o. female  with past medical history significant for ASD repair in April of2023 at [facility], essential hypertension coronary disease but only limited based on cardiac catheterization, ascending aortic aneurysm measuring 43 mm, episode of atrial fibrillation that she had done after surgery with amiodarone being discontinued on May 16. Since have seen her last time she got hip surgery and recovered quite nicely doing well she is very happy because she is back at work.  Comes today 2 months for follow-up overall doing great.  Asymptomatic no chest pain tightness squeezing pressure burning chest, keep working and have no difficulty doing it except for some hip issues  Past Medical History:  Diagnosis Date   Abdominal aortic aneurysm (AAA) without rupture (HCC) 09/01/2017   Abnormal glucose 01/07/2016   Last Assessment & Plan:   Formatting of this note might be different from the original.  Update her lab for this for her   Acute postoperative pain 02/16/2022   Anemia 11/17/2022   Arthritis    Atrial septal defect 09/14/2021   Atrophic vaginitis 06/08/2019   Bilateral hip pain 06/01/2016   Last Assessment & Plan:   Formatting of this note might be different from the original.  She is having xrays , she is off work today and tomorrow and discussed with her use of heat and she has to really try and work on her diet and her weight loss which has ballooned for her since her SO passed away.     Coronary artery disease involving native coronary artery of native heart without angina pectoris 02/09/2017   Dyslipidemia 02/16/2016   Last Assessment & Plan:   Continue to follow this for her   Dyspnea on exertion  02/16/2016   Edema, lower extremity    Essential hypertension 01/07/2016   Last Assessment & Plan:   We had a lengthy discussion about her weight and her BP and med choices, she had indicated that Dr. Fitch had changed something but I reviewed all of his ntoes and cannot find that, she was on ranexa but stopped it herself as it was not working. She did see pulmonary and there was discussion about her labetolol and changing it, we discussed that and beta blockers, she   Extensor tenosynovitis of wrist 10/15/2022   GERD without esophagitis 01/07/2016   Last Assessment & Plan:  Continue with the PPI and have refilled this for her   History of atrial fibrillation 02/20/2022   History of kidney problems    History of kidney stones    History of thyroid  nodule 04/21/2017   Formatting of this note might be different from the original.  Last US  revealed did not need biopsy   Hyperlipidemia 01/27/2017   Overview:  Added automatically from request for surgery [id number]   Hyperparathyroidism (HCC) 09/02/2023   Hypertension    Inflammatory pain 10/15/2022   Joint pain    Kidney stone    Lumbar pain with radiation down both legs 06/01/2016   Last Assessment & Plan:   Formatting of this note might be different from the original.  She was unable to have this done due to  feeling claustrophobic and she is going to have it redone when she follows up after her pulmonary Fu   Malaise and fatigue 01/07/2016   Last Assessment & Plan:   Formatting of this note might be different from the original.  There is some improvement for her and she is pleased though hesitant with how she is doing that it is going to hold, and frustrated, she realizes she has had to change some of her lifestyle as well to accomodate this, she does not want to quit work though if she can avoid   Moderate persistent asthma without complication 08/10/2016   Morbid obesity (HCC) 06/17/2020   Morbid obesity with BMI of 45.0-49.9, adult (HCC)  01/07/2016   Last Assessment & Plan:   She and I discussed in depth that she has to work on her diet more than what she has been doing , she understands this    OSA (obstructive sleep apnea) 09/30/2016   Follows with pulmonary is seen 12/19 for FU bipap 21/15   Overactive thyroid  gland 04/19/2019   Pneumonia    many years ago   Postoperative anemia due to acute blood loss 02/16/2022   Postoperative atrial fibrillation (HCC) 02/20/2022   Precordial pain 02/16/2016   Overview:   Added automatically from request for surgery [id number]   Preop cardiovascular exam 09/10/2021   Primary osteoarthritis involving multiple joints 01/07/2016   Last Assessment & Plan:   Formatting of this note might be different from the original.  She has not been able to get the mri yet, even though it was open it was still difficult for her, would like to see how her PFT are first before give her something like valium to relax her for this test   Restrictive lung disease 08/10/2016   RLS (restless legs syndrome) 09/30/2016   S/P atrial septal defect closure, surgical 02/16/2022   Status post total replacement of left hip 09/14/2022   Thinning hair 10/04/2018   Thoracic aortic aneurysm without rupture (HCC) 02/09/2017   Overview:   Bernie managing.     Thrombocytopenia (HCC) 03/15/2022   Vitamin D  deficiency     Past Surgical History:  Procedure Laterality Date   AMPUTATION Right 10/21/2020   Procedure: AMPUTATION RIGHT MIDDLE FINGER;  Surgeon: Murrell Kuba, MD;  Location: [facility];  Service: Orthopedics;  Laterality: Right;  IV REGIONAL FOREARM BLOCK, BIER BLOCK   ATRIAL SEPTAL DEFECT(ASD) CLOSURE  2023   COLONOSCOPY     CORONARY ANGIOPLASTY     INCISION AND DRAINAGE Right 11/14/2020   Procedure: INCISION AND DRAINAGE RIGHT MIDDLE FINGER;  Surgeon: Murrell Kuba, MD;  Location: [facility];  Service: Orthopedics;  Laterality: Right;   kidney stone removal     RECTAL SURGERY      x2. both as a child   spur removal     TEE WITHOUT CARDIOVERSION N/A 11/26/2021   Procedure: TRANSESOPHAGEAL ECHOCARDIOGRAM (TEE);  Surgeon: Pietro Redell RAMAN, MD;  Location: [facility];  Service: Cardiovascular;  Laterality: N/A;   TOOTH EXTRACTION     TOTAL HIP ARTHROPLASTY Left 09/14/2022   Procedure: LEFT TOTAL HIP ARTHROPLASTY ANTERIOR APPROACH;  Surgeon: Vernetta Lonni GRADE, MD;  Location: [facility];  Service: Orthopedics;  Laterality: Left;   TUBAL LIGATION  1984    Current Medications: Current Meds  Medication Sig   acetaminophen  (TYLENOL ) 325 MG tablet Take 1-2 tablets (325-650 mg total) by mouth every 6 (six) hours as needed for mild pain (pain score 1-3  or temp > 100.5).   albuterol  (VENTOLIN  HFA) 108 (90 Base) MCG/ACT inhaler Inhale 2 puffs into the lungs every 6 (six) hours as needed for wheezing or shortness of breath.   APPLE CIDER VINEGAR PO Take 2 tablets by mouth daily.   aspirin  EC 81 MG tablet Take 81 mg by mouth daily. Swallow whole.   budesonide-formoterol (SYMBICORT) 160-4.5 MCG/ACT inhaler Inhale 2 puffs into the lungs 2 (two) times daily as needed (shortness of breath).   Cholecalciferol  (VITAMIN D ) 125 MCG (5000 UT) CAPS Take 5,000 Units by mouth daily. With Vit K2   Collagen-Vitamin C-Biotin (COLLAGEN 1500/C PO) Take 2 tablets by mouth daily.   cyanocobalamin  2000 MCG tablet Take 2,500 mcg by mouth daily.   ELIQUIS  5 MG TABS tablet TAKE 1 TABLET(5 MG) BY MOUTH TWICE DAILY   fluticasone  (FLONASE ) 50 MCG/ACT nasal spray Place 1 spray into both nostrils daily.   gabapentin (NEURONTIN) 100 MG capsule Take 100 mg by mouth at bedtime.   loperamide (IMODIUM A-D) 2 MG tablet Take 2 mg by mouth 4 (four) times daily as needed for diarrhea or loose stools.   LORazepam  (ATIVAN ) 1 MG tablet Take 1 tablet (1 mg) prior to leaving for MRI, may repeat dose if needed prior to starting MRI.   meclizine (ANTIVERT) 25 MG tablet Take 25 mg by mouth 3 (three) times daily as needed  for dizziness.   metoprolol  succinate (TOPROL -XL) 25 MG 24 hr tablet Take 25 mg by mouth daily.   niacin  (VITAMIN B3) 500 MG tablet Take 500 mg by mouth at bedtime.   nitroGLYCERIN  (NITROSTAT ) 0.4 MG SL tablet Place 0.4 mg under the tongue every 5 (five) minutes as needed for chest pain.   ondansetron  (ZOFRAN ) 4 MG tablet Take 4 mg by mouth every 8 (eight) hours as needed for nausea or vomiting.   OVER THE COUNTER MEDICATION Take 1 tablet by mouth daily. Dynamic  Brain support   oxyCODONE  (OXY IR/ROXICODONE ) 5 MG immediate release tablet Take 5 mg by mouth 2 (two) times daily as needed.   pantoprazole  (PROTONIX ) 40 MG tablet Take 40 mg by mouth daily.   potassium chloride  SA (KLOR-CON  M) 20 MEQ tablet TAKE 1 TABLET(20 MEQ) BY MOUTH DAILY   pyridOXINE  (VITAMIN B6) 100 MG tablet Take 100 mg by mouth daily.   tiZANidine  (ZANAFLEX ) 2 MG tablet Take 1 tablet (2 mg total) by mouth at bedtime.   torsemide  (DEMADEX ) 10 MG tablet Take 20 mg by mouth daily.     Allergies:   Amoxicillin-pot clavulanate, Penicillins, Propoxyphene, Sulfa antibiotics, and Sulfamethoxazole   Social History   Socioeconomic History   Marital status: Divorced    Spouse name: Not on file   Number of children: Not on file   Years of education: Not on file   Highest education level: Not on file  Occupational History   Occupation: [employer]   Tobacco Use   Smoking status: Never   Smokeless tobacco: Never  Vaping Use   Vaping status: Never Used  Substance and Sexual Activity   Alcohol use: Not Currently   Drug use: Never   Sexual activity: Not Currently    Birth control/protection: Post-menopausal  Other Topics Concern   Not on file  Social History Narrative   Are you right handed or left handed? Right   Are you currently employed ?    What is your current occupation? [employer]   Do you live at home alone?   Who lives with you? friend  What type of home do you live in: 1 story or 2 story? one    Caffeine none    Social Drivers of Corporate investment banker Strain: Not on file  Food Insecurity: Low Risk  (06/11/2024)   Received from [facility]   Hunger Vital Sign    Within the past 12 months, you worried that your food would run out before you got money to buy more: Never true    Within the past 12 months, the food you bought just didn't last and you didn't have money to get more. : Never true  Transportation Needs: No Transportation Needs (06/11/2024)   Received from [facility]    In the past 12 months, has lack of reliable transportation kept you from medical appointments, meetings, work or from getting things needed for daily living? : No  Physical Activity: Not on file  Stress: Not on file  Social Connections: Not on file     Family History: The patient's family history includes Alzheimer's disease in her mother; Diabetes in her mother; Hyperlipidemia in her father and mother; Hypertension in her father and mother. There is no history of Breast cancer or BRCA 1/2. ROS:   Please see the history of present illness.    All 14 point review of systems negative except as described per history of present illness  EKGs/Labs/Other Studies Reviewed:    EKG Interpretation Date/Time:  Friday July 06 2024 15:30:58 EDT Ventricular Rate:  63 PR Interval:    QRS Duration:  90 QT Interval:  416 QTC Calculation: 425 R Axis:   -6  Text Interpretation: Junctional rhythm Septal infarct , age undetermined Abnormal ECG When compared with ECG of 14-Sep-2022 09:52, Junctional rhythm has replaced Sinus rhythm Septal infarct is now Present Confirmed by Bernie Charleston [phone]) on 07/06/2024 3:34:53 PM    Recent Labs: 08/10/2023: TSH 0.26  Recent Lipid Panel    Component Value Date/Time   CHOL 206 (H) 11/27/2019 1553   TRIG 90 11/27/2019 1553   HDL 57 11/27/2019 1553   CHOLHDL 4.1 11/09/2018 1040   LDLCALC 133 (H) 11/27/2019 1553    Physical Exam:    VS:   BP 136/80   Pulse 63   Ht 4' 8.6 (1.438 m)   Wt 200 lb (90.7 kg)   SpO2 96%   BMI 43.89 kg/m     Wt Readings from Last 3 Encounters:  07/06/24 200 lb (90.7 kg)  12/28/23 207 lb (93.9 kg)  10/05/23 213 lb (96.6 kg)     GEN:  Well nourished, well developed in no acute distress HEENT: Normal NECK: No JVD; No carotid bruits LYMPHATICS: No lymphadenopathy CARDIAC: RRR, no murmurs, no rubs, no gallops RESPIRATORY:  Clear to auscultation without rales, wheezing or rhonchi  ABDOMEN: Soft, non-tender, non-distended MUSCULOSKELETAL:  No edema; No deformity  SKIN: Warm and dry LOWER EXTREMITIES: no swelling NEUROLOGIC:  Alert and oriented x 3 PSYCHIATRIC:  Normal affect   ASSESSMENT:    1. Dyslipidemia   2. Infrarenal abdominal aortic aneurysm (AAA) without rupture (HCC)   3. Aneurysm of ascending aorta without rupture (HCC)   4. Postoperative atrial fibrillation (HCC)   5. Essential hypertension   6. S/P atrial septal defect closure, surgical    PLAN:    In order of problems listed above:  Infrarenal aortic aneurysm will do abdominal ultrasound to follow-up on that. Ascending aortic aneurysm will schedule him to have echocardiogram to check repair of the intra-atrial septal  aneurysm as well as size of the aneurysm. Paroxysmal atrial fibrillation on Eliquis  which I will continue. Dyslipidemia.  Has been followed by primary care physician   Medication Adjustments/Labs and Tests Ordered: Current medicines are reviewed at length with the patient today.  Concerns regarding medicines are outlined above.  Orders Placed This Encounter  Procedures   EKG 12-Lead   Medication changes: No orders of the defined types were placed in this encounter.   Signed, Alyssa DOROTHA Fitch, MD, [facility] 07/06/2024 3:48 PM    [facility]

## 2024-07-06 NOTE — Patient Instructions (Addendum)
 Medication Instructions:  Your physician recommends that you continue on your current medications as directed. Please refer to the Current Medication list given to you today.  *If you need a refill on your cardiac medications before your next appointment, please call your pharmacy*   Lab Work: None Ordered If you have labs (blood work) drawn today and your tests are completely normal, you will receive your results only by: MyChart Message (if you have MyChart) OR A paper copy in the mail If you have any lab test that is abnormal or we need to change your treatment, we will call you to review the results.   Testing/Procedures:  Your physician has requested that you have an abdominal aorta duplex. During this test, an ultrasound is used to evaluate the aorta. Allow 30 minutes for this exam. Do not eat after midnight the day before and avoid carbonated beverages.  Please note: We ask at that you not bring children with you during ultrasound (echo/ vascular) testing. Due to room size and safety concerns, children are not allowed in the ultrasound rooms during exams. Our front office staff cannot provide observation of children in our lobby area while testing is being conducted. An adult accompanying a patient to their appointment will only be allowed in the ultrasound room at the discretion of the ultrasound technician under special circumstances. We apologize for any inconvenience.  Your physician has requested that you have an echocardiogram. Echocardiography is a painless test that uses sound waves to create images of your heart. It provides your doctor with information about the size and shape of your heart and how well your heart's chambers and valves are working. This procedure takes approximately one hour. There are no restrictions for this procedure. Please do NOT wear cologne, perfume, aftershave, or lotions (deodorant is allowed). Please arrive 15 minutes prior to your appointment  time.  Please note: We ask at that you not bring children with you during ultrasound (echo/ vascular) testing. Due to room size and safety concerns, children are not allowed in the ultrasound rooms during exams. Our front office staff cannot provide observation of children in our lobby area while testing is being conducted. An adult accompanying a patient to their appointment will only be allowed in the ultrasound room at the discretion of the ultrasound technician under special circumstances. We apologize for any inconvenience.    Follow-Up: At [facility], you and your health needs are our priority.  As part of our continuing mission to provide you with exceptional heart care, we have created designated Provider Care Teams.  These Care Teams include your primary Cardiologist (physician) and Advanced Practice Providers (APPs -  Physician Assistants and Nurse Practitioners) who all work together to provide you with the care you need, when you need it.  We recommend signing up for the patient portal called MyChart.  Sign up information is provided on this After Visit Summary.  MyChart is used to connect with patients for Virtual Visits (Telemedicine).  Patients are able to view lab/test results, encounter notes, upcoming appointments, etc.  Non-urgent messages can be sent to your provider as well.   To learn more about what you can do with MyChart, go to [email or web address].    Your next appointment:   6 month(s)  The format for your next appointment:   In Person  Provider:   Lamar Fitch, MD    Other Instructions NA

## 2024-08-13 ENCOUNTER — Telehealth (HOSPITAL_BASED_OUTPATIENT_CLINIC_OR_DEPARTMENT_OTHER): Payer: Self-pay | Admitting: *Deleted

## 2024-08-13 NOTE — Telephone Encounter (Signed)
   Pre-operative Risk Assessment    Patient Name: Alyssa Rosales  DOB: 09-Jul-1951 MRN: [id number]   Date of last office visit: 07/06/24 DR. KRASOWSKI Date of next office visit: NONE  Request for Surgical Clearance    Procedure:  PER DENTAL FORM ONLY FILLINGS AND CROWNS; NO TEETH TO BE EXTRACTED AT THIS TIME    Date of Surgery:  Clearance TBD                                Surgeon:  NOT LISTED Surgeon's Group or Practice Name:  [facility]  Phone number:  [phone] Fax number:  [phone]   Type of Clearance Requested:   - Medical  - Pharmacy:  Hold Aspirin  and Apixaban  (Eliquis )     Type of Anesthesia:  Local  (w/EPI)   Additional requests/questions:    Alyssa Rosales   08/13/2024, 12:12 PM

## 2024-08-19 NOTE — Telephone Encounter (Signed)
 We do not recommend holding anticoagulation for routine dental cleanings, fillings and crowns.

## 2024-08-21 NOTE — Telephone Encounter (Signed)
    Primary Cardiologist: Lamar Fitch, MD  Chart reviewed as part of pre-operative protocol coverage. Simple dental extractions are considered low risk procedures per guidelines and generally do not require any specific cardiac clearance. It is also generally accepted that for simple extractions, fillings, crowns and dental cleanings, there is no need to interrupt blood thinner therapy.   Reviewed with pharmacy team SBE prophylaxis is not required for the patient and holding of anticoagulation is not recommended.  I will route this recommendation to the requesting party via Epic fax function and remove from pre-op pool.  Please call with questions.  Ketzia Guzek D Kateline Kinkade, NP 08/21/2024, 8:18 AM

## 2024-09-03 ENCOUNTER — Ambulatory Visit: Attending: Cardiology

## 2024-09-03 DIAGNOSIS — R0609 Other forms of dyspnea: Secondary | ICD-10-CM | POA: Diagnosis present

## 2024-09-03 LAB — ECHOCARDIOGRAM COMPLETE
AR max vel: 1.78 cm2
AV Area VTI: 1.9 cm2
AV Area mean vel: 1.68 cm2
AV Mean grad: 6 mmHg
AV Peak grad: 10.6 mmHg
Ao pk vel: 1.63 m/s
Area-P 1/2: 3.13 cm2
MV M vel: 2.6 m/s
MV Peak grad: 27 mmHg
MV VTI: 1.29 cm2
S' Lateral: 3.2 cm

## 2024-09-04 ENCOUNTER — Ambulatory Visit: Payer: Self-pay | Admitting: Cardiology

## 2024-09-10 ENCOUNTER — Ambulatory Visit: Attending: Cardiology

## 2024-09-10 DIAGNOSIS — I7121 Aneurysm of the ascending aorta, without rupture: Secondary | ICD-10-CM | POA: Insufficient documentation

## 2024-09-19 NOTE — Telephone Encounter (Signed)
 Patient returned call at this time, discussed echo and US  results with patient. Patient denies any questions/concerns.  Alan, RN

## 2024-09-19 NOTE — Telephone Encounter (Signed)
 Called and LVM for patient to call back to office.  Alan, RN

## 2024-09-26 ENCOUNTER — Telehealth: Payer: Self-pay | Admitting: *Deleted

## 2024-09-26 NOTE — Telephone Encounter (Signed)
 Patient with diagnosis of atrial fibrillation on Eliquis  for anticoagulation.    Procedure:  Revision amputated right middle finger   Date of Surgery:  Clearance TBD        CHA2DS2-VASc Score = 5   This indicates a 7.2% annual risk of stroke. The patient's score is based upon: CHF History: 0 HTN History: 1 Diabetes History: 0 Stroke History: 0 Vascular Disease History: 1 Age Score: 2 Gender Score: 1    CrCl 30 Platelet count 222  Patient has not had an Afib/aflutter ablation in the last 3 months, DCCV within the last 4 weeks or a watchman implanted in the last 45 days   Per office protocol, patient can hold Eliquis  for 2 days prior to procedure.   Patient will not need bridging with Lovenox (enoxaparin) around procedure.  **This guidance is not considered finalized until pre-operative APP has relayed final recommendations.**

## 2024-09-26 NOTE — Telephone Encounter (Signed)
   Pre-operative Risk Assessment    Patient Name: Alyssa Rosales  DOB: [birth year] MRN: [id number]      Request for Surgical Clearance    Procedure:  Revision amputated right middle finger  Date of Surgery:  Clearance TBD                                 Surgeon:  Ozell Hock, MD Surgeon's Group or Practice Name:  [facility] Phone number:  [phone] Fax number:  [phone]   Type of Clearance Requested:   - Medical  - Pharmacy:  Hold Aspirin  and Apixaban  (Eliquis ) How long can pt hold and when can she resume these meds   Type of Anesthesia:  Not Indicated   Additional requests/questions:    Bonney Arloa Donovan Levorn   09/26/2024, 11:11 AM

## 2024-09-26 NOTE — Telephone Encounter (Signed)
Left message to call back and schedule tele pre op appt

## 2024-09-26 NOTE — Telephone Encounter (Signed)
 Note:  CrCl is 66, not 30.    No change to recommendations.

## 2024-09-26 NOTE — Telephone Encounter (Signed)
   Name: Alyssa Rosales  DOB: [birth year]  MRN: [id number]  Primary Cardiologist: Lamar Fitch, MD  Preoperative team, please contact this patient and set up a phone call appointment for further preoperative risk assessment. Please obtain consent and complete medication review. Thank you for your help.  I confirm that guidance regarding antiplatelet and oral anticoagulation therapy has been completed and, if necessary, noted below.  Per office protocol, patient can hold Eliquis  for 2 days prior to procedure.   Patient will not need bridging with Lovenox (enoxaparin) around procedure.  I also confirmed the patient resides in the state of [place] . As per [facility] Medical Board telemedicine laws, the patient must reside in the state in which the provider is licensed.   Alyssa Stanke D Isahia Hollerbach, NP 09/26/2024, 12:53 PM [facility]

## 2024-10-01 NOTE — Telephone Encounter (Signed)
 Tried again today to reach the pt to schedule a tele preop appt. This was the 2nd attempt. Today the vm is full could not leave message to call back.   I will update the requesting office as FYI.

## 2024-10-03 NOTE — Telephone Encounter (Signed)
 3rd attempt as of today to reach the pt to schedule a tele preop appt. I will update the requesting office pt needs to call back to set up tele visit for preop clearance.

## 2024-10-04 NOTE — Telephone Encounter (Signed)
 Pt returning call for clearance, she is asking for calls come to her after 4:00pm as she is working and has to lock up her phone until then. She is asking if someone could give her a call to go over the info

## 2024-10-08 ENCOUNTER — Telehealth (HOSPITAL_BASED_OUTPATIENT_CLINIC_OR_DEPARTMENT_OTHER): Payer: Self-pay | Admitting: *Deleted

## 2024-10-08 NOTE — Telephone Encounter (Signed)
 Pt can only do tele preop appt 4 pm and after. She has been scheduled 10/09/24 4 pm provider slot.   Med rec and consent are done.       Patient Consent for Virtual Visit        Alyssa Rosales has provided verbal consent on 10/08/2024 for a virtual visit (video or telephone).   CONSENT FOR VIRTUAL VISIT FOR:  Alyssa Rosales  By participating in this virtual visit I agree to the following:  I hereby voluntarily request, consent and authorize [facility] and its employed or contracted physicians, physician assistants, nurse practitioners or other licensed health care professionals (the Practitioner), to provide me with telemedicine health care services (the "Services) as deemed necessary by the treating Practitioner. I acknowledge and consent to receive the Services by the Practitioner via telemedicine. I understand that the telemedicine visit will involve communicating with the Practitioner through live audiovisual communication technology and the disclosure of certain medical information by electronic transmission. I acknowledge that I have been given the opportunity to request an in-person assessment or other available alternative prior to the telemedicine visit and am voluntarily participating in the telemedicine visit.  I understand that I have the right to withhold or withdraw my consent to the use of telemedicine in the course of my care at any time, without affecting my right to future care or treatment, and that the Practitioner or I may terminate the telemedicine visit at any time. I understand that I have the right to inspect all information obtained and/or recorded in the course of the telemedicine visit and may receive copies of available information for a reasonable fee.  I understand that some of the potential risks of receiving the Services via telemedicine include:  Delay or interruption in medical evaluation due to technological equipment failure or  disruption; Information transmitted may not be sufficient (e.g. poor resolution of images) to allow for appropriate medical decision making by the Practitioner; and/or  In rare instances, security protocols could fail, causing a breach of personal health information.  Furthermore, I acknowledge that it is my responsibility to provide information about my medical history, conditions and care that is complete and accurate to the best of my ability. I acknowledge that Practitioner's advice, recommendations, and/or decision may be based on factors not within their control, such as incomplete or inaccurate data provided by me or distortions of diagnostic images or specimens that may result from electronic transmissions. I understand that the practice of medicine is not an exact science and that Practitioner makes no warranties or guarantees regarding treatment outcomes. I acknowledge that a copy of this consent can be made available to me via my patient portal [facility] MyChart), or I can request a printed copy by calling the office of [facility].    I understand that my insurance will be billed for this visit.   I have read or had this consent read to me. I understand the contents of this consent, which adequately explains the benefits and risks of the Services being provided via telemedicine.  I have been provided ample opportunity to ask questions regarding this consent and the Services and have had my questions answered to my satisfaction. I give my informed consent for the services to be provided through the use of telemedicine in my medical care

## 2024-10-08 NOTE — Telephone Encounter (Signed)
 Pt can only do tele preop appt 4 pm and after. She has been scheduled 10/09/24 4 pm provider slot.   Med rec and consent are done.

## 2024-10-09 ENCOUNTER — Ambulatory Visit: Attending: Cardiology

## 2024-10-09 DIAGNOSIS — Z01810 Encounter for preprocedural cardiovascular examination: Secondary | ICD-10-CM | POA: Diagnosis present

## 2024-10-09 NOTE — Progress Notes (Signed)
 Virtual Visit via Telephone Note   Because of Alyssa Rosales co-morbid illnesses, she is at least at moderate risk for complications without adequate follow up.  This format is felt to be most appropriate for this patient at this time.  Due to technical limitations with video connection (technology), today's appointment will be conducted as an audio only telehealth visit, and Alyssa Rosales verbally agreed to proceed in this manner.   All issues noted in this document were discussed and addressed.  No physical exam could be performed with this format.  Evaluation Performed:  Preoperative cardiovascular risk assessment _____________   Date:  10/09/2024   Patient ID:  Alyssa Rosales, DOB 01-09-1951, MRN [id number] Patient Location:  Home Provider location:   Office  Primary Care Provider:  Thurmond Cathlyn LABOR., MD Primary Cardiologist:  Alyssa Fitch, MD  Chief Complaint / Patient Profile   73 y.o. y/o female with a h/o ASD repair April 2023, HTN, mild CAD, PAF on chronic anticoagulation, ascending aortic aneurysm, HLD, morbid obesity who is pending revision amputated right middle finger  and presents today for telephonic preoperative cardiovascular risk assessment.  History of Present Illness    Alyssa Rosales is a 73 y.o. female who presents via web designer for a telehealth visit today.  Pt was last seen in cardiology clinic on 07/06/24 by Dr. Fitch.  At that time Alyssa Rosales was doing well.  The patient is now pending procedure as outlined above. Since her last visit, she denies chest pain, shortness of breath, lower extremity edema, fatigue, palpitations, melena, hematuria, hemoptysis, diaphoresis, weakness, presyncope, syncope, orthopnea, and PND. She continues to work 6 days a week which involves lots of walking and she lifts up to 40 lbs multiple times per day. In addition, she walks for 1 hour prior to work for exercise and does not have any  concerning cardiac symptoms.    Past Medical History    Past Medical History:  Diagnosis Date   Abdominal aortic aneurysm (AAA) without rupture 09/01/2017   Abnormal glucose 01/07/2016   Last Assessment & Plan:   Formatting of this note might be different from the original.  Update her lab for this for her   Acute postoperative pain 02/16/2022   Anemia 11/17/2022   Arthritis    Atrial septal defect 09/14/2021   Atrophic vaginitis 06/08/2019   Bilateral hip pain 06/01/2016   Last Assessment & Plan:   Formatting of this note might be different from the original.  She is having xrays , she is off work today and tomorrow and discussed with her use of heat and she has to really try and work on her diet and her weight loss which has ballooned for her since her SO passed away.     Coronary artery disease involving native coronary artery of native heart without angina pectoris 02/09/2017   Dyslipidemia 02/16/2016   Last Assessment & Plan:   Continue to follow this for her   Dyspnea on exertion 02/16/2016   Edema, lower extremity    Essential hypertension 01/07/2016   Last Assessment & Plan:   We had a lengthy discussion about her weight and her BP and med choices, she had indicated that Dr. Fitch had changed something but I reviewed all of his ntoes and cannot find that, she was on ranexa but stopped it herself as it was not working. She did see pulmonary and there was discussion about her labetolol and changing it, we discussed that  and beta blockers, she   Extensor tenosynovitis of wrist 10/15/2022   GERD without esophagitis 01/07/2016   Last Assessment & Plan:  Continue with the PPI and have refilled this for her   History of atrial fibrillation 02/20/2022   History of kidney problems    History of kidney stones    History of thyroid  nodule 04/21/2017   Formatting of this note might be different from the original.  Last US  revealed did not need biopsy   Hyperlipidemia 01/27/2017    Overview:  Added automatically from request for surgery [id number]   Hyperparathyroidism 09/02/2023   Hypertension    Inflammatory pain 10/15/2022   Joint pain    Kidney stone    Lumbar pain with radiation down both legs 06/01/2016   Last Assessment & Plan:   Formatting of this note might be different from the original.  She was unable to have this done due to feeling claustrophobic and she is going to have it redone when she follows up after her pulmonary Fu   Malaise and fatigue 01/07/2016   Last Assessment & Plan:   Formatting of this note might be different from the original.  There is some improvement for her and she is pleased though hesitant with how she is doing that it is going to hold, and frustrated, she realizes she has had to change some of her lifestyle as well to accomodate this, she does not want to quit work though if she can avoid   Moderate persistent asthma without complication 08/10/2016   Morbid obesity (HCC) 06/17/2020   Morbid obesity with BMI of 45.0-49.9, adult (HCC) 01/07/2016   Last Assessment & Plan:   She and I discussed in depth that she has to work on her diet more than what she has been doing , she understands this    OSA (obstructive sleep apnea) 09/30/2016   Follows with pulmonary is seen 12/19 for FU bipap 21/15   Overactive thyroid  gland 04/19/2019   Pneumonia    many years ago   Postoperative anemia due to acute blood loss 02/16/2022   Postoperative atrial fibrillation (HCC) 02/20/2022   Precordial pain 02/16/2016   Overview:   Added automatically from request for surgery [id number]   Preop cardiovascular exam 09/10/2021   Primary osteoarthritis involving multiple joints 01/07/2016   Last Assessment & Plan:   Formatting of this note might be different from the original.  She has not been able to get the mri yet, even though it was open it was still difficult for her, would like to see how her PFT are first before give her something like valium to relax her  for this test   Restrictive lung disease 08/10/2016   RLS (restless legs syndrome) 09/30/2016   S/P atrial septal defect closure, surgical 02/16/2022   Status post total replacement of left hip 09/14/2022   Thinning hair 10/04/2018   Thoracic aortic aneurysm without rupture 02/09/2017   Overview:   Bernie managing.     Thrombocytopenia 03/15/2022   Vitamin D  deficiency    Past Surgical History:  Procedure Laterality Date   AMPUTATION Right 10/21/2020   Procedure: AMPUTATION RIGHT MIDDLE FINGER;  Surgeon: Murrell Kuba, MD;  Location: [facility];  Service: Orthopedics;  Laterality: Right;  IV REGIONAL FOREARM BLOCK, BIER BLOCK   ATRIAL SEPTAL DEFECT(ASD) CLOSURE  2023   COLONOSCOPY     CORONARY ANGIOPLASTY     INCISION AND DRAINAGE Right 11/14/2020   Procedure: INCISION AND DRAINAGE RIGHT  MIDDLE FINGER;  Surgeon: Murrell Kuba, MD;  Location: [facility];  Service: Orthopedics;  Laterality: Right;   kidney stone removal     RECTAL SURGERY     x2. both as a child   spur removal     TEE WITHOUT CARDIOVERSION N/A 11/26/2021   Procedure: TRANSESOPHAGEAL ECHOCARDIOGRAM (TEE);  Surgeon: Pietro Redell RAMAN, MD;  Location: [facility] ENDOSCOPY;  Service: Cardiovascular;  Laterality: N/A;   TOOTH EXTRACTION     TOTAL HIP ARTHROPLASTY Left 09/14/2022   Procedure: LEFT TOTAL HIP ARTHROPLASTY ANTERIOR APPROACH;  Surgeon: Vernetta Lonni GRADE, MD;  Location: [facility] OR;  Service: Orthopedics;  Laterality: Left;   TUBAL LIGATION  1984    Allergies  Allergies  Allergen Reactions   Amoxicillin-Pot Clavulanate Diarrhea and Nausea And Vomiting   Penicillins Diarrhea   Propoxyphene Rash and Other (See Comments)    violent  Violent 01/25/22 Per [facility] allergy list brought with patient, patient unsure of exact reaction   Sulfa Antibiotics Rash    01/25/22 Patient unsure of exact reaction   Sulfamethoxazole Rash    Home Medications    Prior to Admission  medications   Medication Sig Start Date End Date Taking? Authorizing Provider  acetaminophen  (TYLENOL ) 325 MG tablet Take 1-2 tablets (325-650 mg total) by mouth every 6 (six) hours as needed for mild pain (pain score 1-3 or temp > 100.5). 09/15/22   Gretta Bertrum ORN, PA-C  albuterol  (VENTOLIN  HFA) 108 (90 Base) MCG/ACT inhaler Inhale 2 puffs into the lungs every 6 (six) hours as needed for wheezing or shortness of breath. 03/27/21   [provider]  APPLE CIDER VINEGAR PO Take 2 tablets by mouth daily.    [provider]  aspirin  EC 81 MG tablet Take 81 mg by mouth daily. Swallow whole.    [provider]  budesonide-formoterol (SYMBICORT) 160-4.5 MCG/ACT inhaler Inhale 2 puffs into the lungs 2 (two) times daily as needed (shortness of breath).    [provider]  Cholecalciferol  (VITAMIN D ) 125 MCG (5000 UT) CAPS Take 5,000 Units by mouth daily. With Vit K2    [provider]  Collagen-Vitamin C-Biotin (COLLAGEN 1500/C PO) Take 2 tablets by mouth daily.    [provider]  cyanocobalamin  2000 MCG tablet Take 2,500 mcg by mouth daily.    [provider]  ELIQUIS  5 MG TABS tablet TAKE 1 TABLET(5 MG) BY MOUTH TWICE DAILY 07/04/24   Krasowski, Robert J, MD  fluticasone  (FLONASE ) 50 MCG/ACT nasal spray Place 1 spray into both nostrils daily.    [provider]  gabapentin (NEURONTIN) 100 MG capsule Take 100 mg by mouth at bedtime. 04/12/24   [provider]  loperamide (IMODIUM A-D) 2 MG tablet Take 2 mg by mouth 4 (four) times daily as needed for diarrhea or loose stools.    [provider]  LORazepam  (ATIVAN ) 1 MG tablet Take 1 tablet (1 mg) prior to leaving for MRI, may repeat dose if needed prior to starting MRI. 08/22/23   Leigh Venetia CROME, MD  meclizine (ANTIVERT) 25 MG tablet Take 25 mg by mouth 3 (three) times daily as needed for dizziness. 03/29/21   [provider]  metoprolol  succinate (TOPROL -XL) 25  MG 24 hr tablet Take 25 mg by mouth daily.    [provider]  niacin  (VITAMIN B3) 500 MG tablet Take 500 mg by mouth at bedtime.    [provider]  nitroGLYCERIN  (NITROSTAT ) 0.4 MG SL tablet Place  0.4 mg under the tongue every 5 (five) minutes as needed for chest pain.    [provider]  ondansetron  (ZOFRAN ) 4 MG tablet Take 4 mg by mouth every 8 (eight) hours as needed for nausea or vomiting.    [provider]  OVER THE COUNTER MEDICATION Take 1 tablet by mouth daily. Dynamic  Brain support    [provider]  oxyCODONE  (OXY IR/ROXICODONE ) 5 MG immediate release tablet Take 5 mg by mouth 2 (two) times daily as needed.    [provider]  pantoprazole  (PROTONIX ) 40 MG tablet Take 40 mg by mouth daily. 09/08/19   [provider]  potassium chloride  SA (KLOR-CON  M) 20 MEQ tablet TAKE 1 TABLET(20 MEQ) BY MOUTH DAILY 09/22/23   Krasowski, Robert J, MD  pyridOXINE  (VITAMIN B6) 100 MG tablet Take 100 mg by mouth daily.    [provider]  tiZANidine  (ZANAFLEX ) 2 MG tablet Take 1 tablet (2 mg total) by mouth at bedtime. 06/16/23   Gretta Bertrum ORN, PA-C  torsemide  (DEMADEX ) 10 MG tablet Take 20 mg by mouth daily. 07/07/18   [provider]    Physical Exam    Vital Signs:  Alyssa Rosales does not have vital signs available for review today.  Given telephonic nature of communication, physical exam is limited. AAOx3. NAD. Normal affect.  Speech and respirations are unlabored.  Accessory Clinical Findings    None  Assessment & Plan    1.  Preoperative Cardiovascular Risk Assessment: According to the Revised Cardiac Risk Index (RCRI), her Perioperative Risk of Major Cardiac Event is (%): 0.4. Her Functional Capacity in METs is: 7.04 according to the Duke Activity Status Index (DASI). The patient is doing well from a cardiac perspective. Therefore, based on ACC/AHA guidelines, the patient would be at acceptable risk  for the planned procedure without further cardiovascular testing.   The patient was advised that if she develops new symptoms prior to surgery to contact our office to arrange for a follow-up visit, and she verbalized understanding.  Per office protocol, she may hold Eliquis  for 2 days prior to procedure and should resume as soon as hemodynamically stable postoperatively.  A copy of this note will be routed to requesting surgeon.  Time:   Today, I have spent 10 minutes with the patient with telehealth technology discussing medical history, symptoms, and management plan.     Alyssa EMERSON Bane, NP-C  10/09/2024, 4:19 PM [address] [place], [place] [address] Office [phone] Fax [phone]

## 2024-10-15 NOTE — Telephone Encounter (Signed)
 Pt called back in regarding clearance for surgery. Please advise.

## 2024-10-16 NOTE — Telephone Encounter (Signed)
 Me    10/16/24  8:33 AM Note I called the pt back and left her detailed message that she was cleared by Rosaline Bane, NP 10/09/24 tele preop appt. Notes from NP reflect she has cleared the pt and has advised the pt of the blood thinner hold Eliquis  x 2 days prior. I will re-fax these notes to the requesting office.       10/16/24  8:23 AM You attempted to contact Rosales, Alyssa C (Left Message)    10/15/24  4:54 PM Kirby, Jada routed this conversation to Cv Div Preop Callback (Selected Message)  Kirby, Jada JK   10/15/24  4:54 PM Note Pt called back in regarding clearance for surgery. Please advise.         10/15/24  4:53 PM Forquer, Marlaine BROCKS contacted Kirby, Jada

## 2024-10-16 NOTE — Telephone Encounter (Signed)
 I called the pt back and left her detailed message that she was cleared by Rosaline Bane, NP 10/09/24 tele preop appt. Notes from NP reflect she has cleared the pt and has advised the pt of the blood thinner hold Eliquis  x 2 days prior. I will re-fax these notes to the requesting office.

## 2025-01-03 ENCOUNTER — Ambulatory Visit: Admitting: Cardiology
